# Patient Record
Sex: Male | Born: 1960 | Race: White | Hispanic: No | Marital: Married | Smoking: Current some day smoker
Health system: Southern US, Community
[De-identification: ages and names within clinical notes are randomized; demographics above are authoritative.]

## PROBLEM LIST (undated history)

## (undated) DIAGNOSIS — I10 Essential (primary) hypertension: Secondary | ICD-10-CM

## (undated) DIAGNOSIS — R29898 Other symptoms and signs involving the musculoskeletal system: Secondary | ICD-10-CM

## (undated) DIAGNOSIS — G4733 Obstructive sleep apnea (adult) (pediatric): Secondary | ICD-10-CM

## (undated) DIAGNOSIS — Z9889 Other specified postprocedural states: Secondary | ICD-10-CM

## (undated) DIAGNOSIS — Z720 Tobacco use: Secondary | ICD-10-CM

## (undated) DIAGNOSIS — I639 Cerebral infarction, unspecified: Secondary | ICD-10-CM

## (undated) DIAGNOSIS — R7303 Prediabetes: Secondary | ICD-10-CM

## (undated) DIAGNOSIS — R634 Abnormal weight loss: Secondary | ICD-10-CM

## (undated) DIAGNOSIS — B1920 Unspecified viral hepatitis C without hepatic coma: Secondary | ICD-10-CM

## (undated) DIAGNOSIS — I428 Other cardiomyopathies: Secondary | ICD-10-CM

## (undated) DIAGNOSIS — I213 ST elevation (STEMI) myocardial infarction of unspecified site: Secondary | ICD-10-CM

## (undated) HISTORY — DX: Other specified postprocedural states: Z98.890

## (undated) HISTORY — DX: Cerebral infarction, unspecified: I63.9

---

## 1999-11-05 DIAGNOSIS — B1920 Unspecified viral hepatitis C without hepatic coma: Secondary | ICD-10-CM

## 1999-11-05 HISTORY — DX: Unspecified viral hepatitis C without hepatic coma: B19.20

## 2011-11-05 DIAGNOSIS — Z9889 Other specified postprocedural states: Secondary | ICD-10-CM

## 2011-11-05 HISTORY — DX: Other specified postprocedural states: Z98.890

## 2012-06-28 ENCOUNTER — Emergency Department (HOSPITAL_COMMUNITY): Payer: Medicaid - Out of State

## 2012-06-28 ENCOUNTER — Inpatient Hospital Stay (HOSPITAL_COMMUNITY)
Admission: EM | Admit: 2012-06-28 | Discharge: 2012-07-01 | DRG: 065 | Disposition: A | Discharge status: Rehabilitation Facility | Payer: Medicaid - Out of State | Attending: Cardiology | Admitting: Cardiology

## 2012-06-28 ENCOUNTER — Encounter (HOSPITAL_COMMUNITY)
Admission: EM | Disposition: A | Discharge status: Rehabilitation Facility | Payer: Self-pay | Source: Home / Self Care | Attending: Cardiology

## 2012-06-28 ENCOUNTER — Encounter (HOSPITAL_COMMUNITY): Payer: Self-pay | Admitting: Emergency Medicine

## 2012-06-28 DIAGNOSIS — R7309 Other abnormal glucose: Secondary | ICD-10-CM | POA: Diagnosis present

## 2012-06-28 DIAGNOSIS — I6529 Occlusion and stenosis of unspecified carotid artery: Secondary | ICD-10-CM | POA: Diagnosis present

## 2012-06-28 DIAGNOSIS — I2510 Atherosclerotic heart disease of native coronary artery without angina pectoris: Secondary | ICD-10-CM | POA: Diagnosis present

## 2012-06-28 DIAGNOSIS — I6330 Cerebral infarction due to thrombosis of unspecified cerebral artery: Principal | ICD-10-CM | POA: Diagnosis present

## 2012-06-28 DIAGNOSIS — R42 Dizziness and giddiness: Secondary | ICD-10-CM

## 2012-06-28 DIAGNOSIS — Z880 Allergy status to penicillin: Secondary | ICD-10-CM

## 2012-06-28 DIAGNOSIS — I213 ST elevation (STEMI) myocardial infarction of unspecified site: Secondary | ICD-10-CM

## 2012-06-28 DIAGNOSIS — I498 Other specified cardiac arrhythmias: Secondary | ICD-10-CM | POA: Diagnosis present

## 2012-06-28 DIAGNOSIS — F17200 Nicotine dependence, unspecified, uncomplicated: Secondary | ICD-10-CM | POA: Diagnosis present

## 2012-06-28 DIAGNOSIS — R29898 Other symptoms and signs involving the musculoskeletal system: Secondary | ICD-10-CM

## 2012-06-28 DIAGNOSIS — R634 Abnormal weight loss: Secondary | ICD-10-CM

## 2012-06-28 DIAGNOSIS — Z8249 Family history of ischemic heart disease and other diseases of the circulatory system: Secondary | ICD-10-CM

## 2012-06-28 DIAGNOSIS — G8190 Hemiplegia, unspecified affecting unspecified side: Secondary | ICD-10-CM | POA: Diagnosis present

## 2012-06-28 DIAGNOSIS — I639 Cerebral infarction, unspecified: Secondary | ICD-10-CM | POA: Diagnosis not present

## 2012-06-28 DIAGNOSIS — I43 Cardiomyopathy in diseases classified elsewhere: Secondary | ICD-10-CM | POA: Diagnosis present

## 2012-06-28 DIAGNOSIS — I119 Hypertensive heart disease without heart failure: Secondary | ICD-10-CM | POA: Diagnosis present

## 2012-06-28 DIAGNOSIS — I4510 Unspecified right bundle-branch block: Secondary | ICD-10-CM | POA: Diagnosis present

## 2012-06-28 DIAGNOSIS — Z720 Tobacco use: Secondary | ICD-10-CM

## 2012-06-28 DIAGNOSIS — I959 Hypotension, unspecified: Secondary | ICD-10-CM | POA: Diagnosis present

## 2012-06-28 DIAGNOSIS — R279 Unspecified lack of coordination: Secondary | ICD-10-CM | POA: Diagnosis present

## 2012-06-28 DIAGNOSIS — G4733 Obstructive sleep apnea (adult) (pediatric): Secondary | ICD-10-CM

## 2012-06-28 DIAGNOSIS — R112 Nausea with vomiting, unspecified: Secondary | ICD-10-CM | POA: Diagnosis present

## 2012-06-28 DIAGNOSIS — Z8619 Personal history of other infectious and parasitic diseases: Secondary | ICD-10-CM

## 2012-06-28 DIAGNOSIS — I10 Essential (primary) hypertension: Secondary | ICD-10-CM

## 2012-06-28 DIAGNOSIS — I428 Other cardiomyopathies: Secondary | ICD-10-CM

## 2012-06-28 HISTORY — DX: Essential (primary) hypertension: I10

## 2012-06-28 HISTORY — DX: Tobacco use: Z72.0

## 2012-06-28 HISTORY — DX: Other symptoms and signs involving the musculoskeletal system: R29.898

## 2012-06-28 HISTORY — DX: Obstructive sleep apnea (adult) (pediatric): G47.33

## 2012-06-28 HISTORY — DX: ST elevation (STEMI) myocardial infarction of unspecified site: I21.3

## 2012-06-28 HISTORY — DX: Prediabetes: R73.03

## 2012-06-28 HISTORY — DX: Abnormal weight loss: R63.4

## 2012-06-28 HISTORY — DX: Other cardiomyopathies: I42.8

## 2012-06-28 HISTORY — DX: Unspecified viral hepatitis C without hepatic coma: B19.20

## 2012-06-28 HISTORY — PX: LEFT HEART CATHETERIZATION WITH CORONARY ANGIOGRAM: SHX5451

## 2012-06-28 LAB — CARDIAC PANEL(CRET KIN+CKTOT+MB+TROPI)
Relative Index: 4.1 — ABNORMAL HIGH (ref 0.0–2.5)
Relative Index: INVALID (ref 0.0–2.5)
Total CK: 106 U/L (ref 7–232)
Total CK: 79 U/L (ref 7–232)
Troponin I: <0.3 ng/mL (ref <0.30)

## 2012-06-28 LAB — CBC WITH DIFFERENTIAL/PLATELET
Hemoglobin: 14.5 g/dL (ref 13.0–17.0)
Lymphocytes Relative: 31 % (ref 12–46)
Lymphs Abs: 4 10*3/uL (ref 0.7–4.0)
Monocytes Relative: 8 % (ref 3–12)
Neutro Abs: 7.2 10*3/uL (ref 1.7–7.7)
Neutrophils Relative %: 56 % (ref 43–77)
Platelets: 255 10*3/uL (ref 150–400)
RBC: 5.01 MIL/uL (ref 4.22–5.81)
WBC: 12.9 10*3/uL — ABNORMAL HIGH (ref 4.0–10.5)

## 2012-06-28 LAB — TSH: TSH: 0.787 u[IU]/mL (ref 0.350–4.500)

## 2012-06-28 LAB — APTT: aPTT: 53 seconds — ABNORMAL HIGH (ref 24–37)

## 2012-06-28 LAB — COMPREHENSIVE METABOLIC PANEL
AST: 17 U/L (ref 0–37)
BUN: 11 mg/dL (ref 6–23)
CO2: 30 mEq/L (ref 19–32)
Calcium: 9.1 mg/dL (ref 8.4–10.5)
Creatinine, Ser: 0.79 mg/dL (ref 0.50–1.35)
GFR calc Af Amer: >90 mL/min (ref >90)
GFR calc non Af Amer: >90 mL/min (ref >90)

## 2012-06-28 LAB — URINALYSIS, ROUTINE W REFLEX MICROSCOPIC
Bilirubin Urine: NEGATIVE
Glucose, UA: NEGATIVE mg/dL
Hgb urine dipstick: NEGATIVE
Protein, ur: NEGATIVE mg/dL
Urobilinogen, UA: 1 mg/dL (ref 0.0–1.0)

## 2012-06-28 LAB — MRSA PCR SCREENING: MRSA by PCR: NEGATIVE

## 2012-06-28 LAB — PROTIME-INR: INR: 1.02 (ref 0.00–1.49)

## 2012-06-28 LAB — MAGNESIUM: Magnesium: 2.2 mg/dL (ref 1.5–2.5)

## 2012-06-28 LAB — HEMOGLOBIN A1C: Hgb A1c MFr Bld: 6.7 % — ABNORMAL HIGH (ref <5.7)

## 2012-06-28 SURGERY — LEFT HEART CATHETERIZATION WITH CORONARY ANGIOGRAM
Anesthesia: LOCAL

## 2012-06-28 MED ORDER — ONDANSETRON HCL 4 MG/2ML IJ SOLN
4.0000 mg | Freq: Four times a day (QID) | INTRAMUSCULAR | Status: DC | PRN
  Administered 2012-06-28 – 2012-06-29 (×3): 4 mg via INTRAVENOUS
  Filled 2012-06-28 (×5): qty 2

## 2012-06-28 MED ORDER — MORPHINE SULFATE 2 MG/ML IJ SOLN
2.0000 mg | INTRAMUSCULAR | Status: DC | PRN
  Administered 2012-06-28 – 2012-06-30 (×5): 2 mg via INTRAVENOUS
  Filled 2012-06-28 (×5): qty 1

## 2012-06-28 MED ORDER — ALPRAZOLAM 0.25 MG PO TABS: 0.2500 mg | ORAL_TABLET | Freq: Three times a day (TID) | ORAL | Status: DC | PRN

## 2012-06-28 MED ORDER — ATORVASTATIN CALCIUM 40 MG PO TABS
40.0000 mg | ORAL_TABLET | Freq: Every day | ORAL | Status: DC
  Administered 2012-06-28 – 2012-06-30 (×2): 40 mg via ORAL
  Filled 2012-06-28 (×4): qty 1

## 2012-06-28 MED ORDER — SODIUM CHLORIDE 0.9 % IV SOLN: 250.0000 mL | INTRAVENOUS | Status: DC | PRN

## 2012-06-28 MED ORDER — FENTANYL CITRATE 0.05 MG/ML IJ SOLN
INTRAMUSCULAR | Status: AC
  Filled 2012-06-28: qty 2

## 2012-06-28 MED ORDER — SODIUM CHLORIDE 0.9 % IJ SOLN
3.0000 mL | Freq: Two times a day (BID) | INTRAMUSCULAR | Status: DC
  Administered 2012-06-29 – 2012-06-30 (×3): 3 mL via INTRAVENOUS
  Administered 2012-06-30: 10:00:00 via INTRAVENOUS
  Administered 2012-07-01: 3 mL via INTRAVENOUS

## 2012-06-28 MED ORDER — POTASSIUM CHLORIDE 10 MEQ/50ML IV SOLN
INTRAVENOUS | Status: AC
  Filled 2012-06-28: qty 50

## 2012-06-28 MED ORDER — HYDRALAZINE HCL 20 MG/ML IJ SOLN
10.0000 mg | Freq: Once | INTRAMUSCULAR | Status: AC
  Administered 2012-06-28: 10 mg via INTRAVENOUS

## 2012-06-28 MED ORDER — HYDRALAZINE HCL 20 MG/ML IJ SOLN
INTRAMUSCULAR | Status: AC
  Filled 2012-06-28: qty 1

## 2012-06-28 MED ORDER — NITROGLYCERIN IN D5W 200-5 MCG/ML-% IV SOLN
50.0000 ug/min | INTRAVENOUS | Status: DC
  Administered 2012-06-28 – 2012-06-29 (×2): 50 ug/min via INTRAVENOUS
  Filled 2012-06-28: qty 250

## 2012-06-28 MED ORDER — NITROGLYCERIN 0.2 MG/ML ON CALL CATH LAB
INTRAVENOUS | Status: AC
  Filled 2012-06-28: qty 1

## 2012-06-28 MED ORDER — LISINOPRIL 10 MG PO TABS
10.0000 mg | ORAL_TABLET | Freq: Every day | ORAL | Status: DC
  Administered 2012-06-28: 10 mg via ORAL
  Filled 2012-06-28 (×2): qty 1

## 2012-06-28 MED ORDER — HEPARIN (PORCINE) IN NACL 2-0.9 UNIT/ML-% IJ SOLN
INTRAMUSCULAR | Status: AC
  Filled 2012-06-28: qty 2000

## 2012-06-28 MED ORDER — SODIUM CHLORIDE 0.9 % IJ SOLN: 3.0000 mL | INTRAMUSCULAR | Status: DC | PRN

## 2012-06-28 MED ORDER — HYDRALAZINE HCL 20 MG/ML IJ SOLN
INTRAMUSCULAR | Status: AC
  Filled 2012-06-28: qty 2

## 2012-06-28 MED ORDER — SODIUM CHLORIDE 0.9 % IV SOLN
INTRAVENOUS | Status: DC
  Administered 2012-06-28: 16:00:00 via INTRAVENOUS

## 2012-06-28 MED ORDER — NITROGLYCERIN IN D5W 200-5 MCG/ML-% IV SOLN: 2.0000 ug/min | INTRAVENOUS | Status: DC

## 2012-06-28 MED ORDER — TRAMADOL HCL 50 MG PO TABS
50.0000 mg | ORAL_TABLET | Freq: Four times a day (QID) | ORAL | Status: DC | PRN
  Administered 2012-07-01: 50 mg via ORAL
  Filled 2012-06-28 (×2): qty 1

## 2012-06-28 MED ORDER — MORPHINE SULFATE 2 MG/ML IJ SOLN
2.0000 mg | Freq: Once | INTRAMUSCULAR | Status: AC
  Administered 2012-06-28: 2 mg via INTRAVENOUS
  Filled 2012-06-28: qty 1

## 2012-06-28 MED ORDER — LIDOCAINE HCL (PF) 1 % IJ SOLN
INTRAMUSCULAR | Status: AC
  Filled 2012-06-28: qty 30

## 2012-06-28 MED ORDER — POTASSIUM CHLORIDE 10 MEQ/100ML IV SOLN
INTRAVENOUS | Status: AC
  Administered 2012-06-28: 10 meq
  Filled 2012-06-28: qty 100

## 2012-06-28 MED ORDER — ASPIRIN 81 MG PO CHEW
81.0000 mg | CHEWABLE_TABLET | Freq: Every day | ORAL | Status: DC
  Administered 2012-06-28: 81 mg via ORAL
  Filled 2012-06-28: qty 1

## 2012-06-28 MED ORDER — MIDAZOLAM HCL 2 MG/2ML IJ SOLN
INTRAMUSCULAR | Status: AC
  Filled 2012-06-28: qty 2

## 2012-06-28 MED ORDER — CARVEDILOL 3.125 MG PO TABS
3.1250 mg | ORAL_TABLET | Freq: Two times a day (BID) | ORAL | Status: DC
  Administered 2012-06-28: 3.125 mg via ORAL
  Filled 2012-06-28 (×4): qty 1

## 2012-06-28 MED ORDER — HYDRALAZINE HCL 20 MG/ML IJ SOLN: 20.0000 mg | INTRAMUSCULAR | Status: DC | PRN

## 2012-06-28 MED ORDER — ACETAMINOPHEN 325 MG PO TABS
650.0000 mg | ORAL_TABLET | ORAL | Status: DC | PRN
  Administered 2012-06-28: 650 mg via ORAL
  Filled 2012-06-28: qty 2

## 2012-06-28 NOTE — CV Procedure (Signed)
[facility] PERCUTANEOUS CORONARY INTERVENTION REPORT  NAME:  Glenn Maldonado   MRN: [id number] DOB:  10/09/1961   ADMIT DATE: 06/28/2012  INTERVENTIONAL CARDIOLOGIST: Marykay Lex, M.D., MS PRIMARY CARE PROVIDER: Sheila Oats, MD PRIMARY CARDIOLOGIST: None   PATIENT:  Glenn Maldonado is a 51 y.o. male with no prior cardiac history, presents today from [facility] after developing chest pressure, lt. Arm heaviness this am, and having difficulty walking, legs not wanting to go where he wanted then to. He went on to church and symptoms increased so he went to the hospital He was HTN with 178/118. He was started on ASA given IV NTG, IV lopressor 5 mg IV and 5,000 units of Heparin given. Heparin drip started. EKG with ST elevation in ant Leads, V3 mostly. Pt transported to [facility] emergently for cardiac cath. On arrival he seems to down play symptoms, but still with chest pressure, "like a house cat sitting on my chest" and lt. Arm heaviness. Ext. Still feel tingling. BP still elevated IV NTG increased. IV Hydralazine 10 mg given. Will resume IV Heparin once CT of Head evaluated for bleed.  Diagnoses with HTN 2 years ago but has not been able to afford MD since to continue meds. Has had weight loss recently without trying, previously borderline diabetic.  Based upon the dramatic ST Depressions in Inferolateral Leads & STE in Septal leads with atypical Sx for Angina - there is sufficient concern for LAD disease to refer for urgent cardiac catheterization.  PRE-OPERATIVE DIAGNOSIS:    Anterior STEMI  PROCEDURES PERFORMED:    Left Heart Catheterization via 6 Fr Right Common Femoral Artery access  Left Ventriculography, (RAO) 12 ml/sec for 25 ml total contrast  Native Coronary Angiography  Intracoronary Nitroglycerin injection  PROCEDURE: Consent:  Risks of procedure as well as the alternatives and risks of each were explained to the (patient/caregiver).   Consent for procedure obtained.  PROCEDURE: The patient was brought to the 2nd Floor [facility] in the fasting state and prepped and draped in the usual sterile fashion for Right groin access. Sterile technique was used including antiseptics, cap, gloves, gown, hand hygiene, mask and sheet.  Skin prep: Chlorhexidine;  Time Out: Verified patient identification, verified procedure, site/side was marked, verified correct patient position, special equipment/implants available, medications/allergies/relevent history reviewed, required imaging and test results available.  Performed  Access: Right Common Femoral Artery Artery; 6 Fr Sheath; fluoroscopically guided, modified Seldinger technique.  Initial puncture appeared to be a calcified portion distal common femoral artery and I was unable to pass a wire. Therefore the wire and balloon needle removed and draped in the pressure held for 3 minutes to ensure hemostasis. On second access attempt, a Versicore wire passed easily without difficulty. Diagnostic Angiography:  Catheter were advanced or exchanged over a wire.  After initial access using the Versicore wire, a J-wire was used for the remainder the case.  Left Coronary Artery Angiography: 5 French JL4  Right Coronary Artery Angiography: 5 French JR 4  LV Hemodynamics (LV Gram): 5 French angled pigtail catheter; RAO projection 12 mL contrast/sec for total 25 mL  Hemodynamics:  Central Aortic / Mean Pressures:   Initial pressures -- 189/113 mmHg, 145 mmHg; after IV hydralazine and intracoronary nitroglycerin with increasing then the IV nitroglycerin rate 50 mcg/minute -- 150/86 mmHg; 114 mmHg  Left Ventricular Pressures / EDP: 150/11 mmHg; 14 mmHg  Left Ventriculography:  EF: 40-45% %  Wall Motion: Global hypokinesis  Coronary  Anatomy:  Left Main: Large-caliber vessel, that bifurcates into LAD and Circumflex, along with a small insignificant Ramus Intermedius;  angiographically normal LAD: Large caliber artery that wraps around the apex after giving rise to 2 major diagonal branches from the proximal and early mid segment; minimal luminal irregularities.  No obvious potential culprit lesion anterior ST elevations.  D1: Moderate caliber vessel covers a large distribution with 3 main branches; angiographically normal  D2: Moderate moderate caliber vessel covering a small distribution than D1; angiographically normal Left Circumflex: Large caliber vessel gives rise to a small marginal branch before bifurcating into a large OM 1 and the AV Groove Circumflex that bifurcates into OM 2 and total portion that bifurcates into 2 posterior lateral branches.  OM1: Moderate caliber vessel bifurcates distally covering large distribution the inferolateral wall; minimal luminal irregularities  OM 2: Small moderate caliber vessel; minimal luminal irregular Ramus intermedius: Small caliber vessel tapers into a very small vessel after short normal segment is diffusely diseased covering are small distribution.  RCA: Large caliber dominant vessel with a 20% proximal lesion diffuse 10% lesions throughout the proximal and mid segment. There is a moderate caliber artery marginal branch just before the genu. The distal vessel as a tech 20-30% narrowing just proximal to the bifurcation of the PDA and posterior lateral system.  RPDA: Moderate caliber vessel; minimal luminal irregularities  RPL Sysytem:The Right Posterior AV Groove Branch begins a moderate caliber vessel gives [place] to small posterior lateral branch proximally and a major, extensive RPL 2 as well as a small RPL 3; minimal luminal irregularities.  ANESTHESIA:   Local Lidocaine 18  ml SEDATION:  2 mg IV Versed, 50 mcg IV fentanyl ;  MEDICATIONS:  IV Hydralazine 20mg ; IC NTG   EBL:   < 20 ml  PATIENT DISPOSITION:    The patient was transferred to the PACU holding area in a hemodynamicaly stable, chest pain  free condition.  The patient tolerated the procedure well, and there were no complications.  The patient was stable before, during, and after the procedure.  POST-OPERATIVE DIAGNOSIS:    Non-obstructive CAD.  NO lesion to explain Anterior STE on ECG  Moderately reduced EF ~35% on LV Gram with LVH & global hypokinesis -- Non-ischemic, Hypertensive Cardiomyopathy  PLAN OF CARE:  Aggressive BP control overnight & institute BB + ACE-I for BP control & low EF  Statin  Smoking cessation counseling  Care Management to assist with medication needs (ran out of BP meds due to $$ concerns)  Based on his initial presentation with bilateral leg weakness and arm numbness, will order MRI/MRA of the brain for possible CVA/TIA.    Anticipate d/c as early as tomorrow if MRI is normal and blood pressure controlled, with no gait instability/leg weakness    Shaqueta Casady,Gehrig W, M.D., M.S. [facility] [address] [place], [place]  [address]  [phone]  06/28/2012 3:24 PM

## 2012-06-28 NOTE — ED Notes (Signed)
Consent for cardiac cath signed.

## 2012-06-28 NOTE — ED Notes (Signed)
Pt returned from CT scan with RN at bedside.  Cath lab not ready.  Portable chest xray being completed.

## 2012-06-28 NOTE — ED Notes (Addendum)
Dizziness and left arm pain.  Pt from [facility] in [place].  Heparin 5000 unit bolus and Heparin 1000 units/hr started at [facility].  Nitroglycerin drip 20 mcg/kg/hr started at [facility]. Metoprolol 5 mg IV and Aspiring 324mg  PO given at [facility].

## 2012-06-28 NOTE — ED Notes (Signed)
Delay in Heparin administration.  NP placed hold on medication until results of CT scan.

## 2012-06-28 NOTE — Progress Notes (Signed)
Pt c/o numbness in left fingers and hands, and headache associated with vomitting.Bedside stroke assessment done x2 by primary RN and charge nurse, were negative.Morphine and zofran IV given. Informed on call PA. New orders for xanax received. Few minutes later, pt went back to sleep and snoring.

## 2012-06-28 NOTE — H&P (Signed)
Glenn Maldonado is an 51 y.o. male.   Chief Complaint: chest and lt. Arm heaviness HPI: 51 year old WMM, with no prior cardiac history, presents today from [facility] after developing chest pressure, lt. Arm heaviness this am, and having difficulty walking, legs not wanting to go where he wanted then to.  He went on to church and symptoms increased so he went to the hospital  He was HTN with 178/118.  He was started on ASA given IV NTG, IV lopressor 5 mg IV and 5,000 units of Heparin given.  Heparin drip started.  EKG with ST elevation in ant Leads, V3 mostly.   Pt transported to [facility] emergently for cardiac cath.  On arrival he seems to down play symptoms, but still with chest pressure, "like a house cat sitting on my chest" and lt. Arm heaviness.  Ext. Still feel tingling.  BP still elevated IV NTG increased.  IV Hydralazine 10 mg given.  Will resume IV Heparin once CT of   Head evaluated for bleed.  Diagnoses with HTN 2 years ago but has not been able to afford MD since to continue meds.  Has had weight loss recently without trying, previously borderline diabetic.    Past Medical History  Diagnosis Date  . Hypertension   . Borderline diabetic   . STEMI (ST elevation myocardial infarction) 06/28/2012  . HTN (hypertension), uncontrolled 06/28/2012  . Leg weakness, bilateral 06/28/2012  . Tobacco abuse 06/28/2012  . Weight loss, non-intentional 06/28/2012  . OSA (obstructive sleep apnea), history of, but with recent weight loss less symptoms 06/28/2012  . Hepatitis C 2001    treated with interferon    History reviewed. No pertinent past surgical history.did have Lt. Knee surgery multiple years ago   Family History  Problem Relation Age of Onset  . Coronary artery disease Mother   . Peripheral vascular disease Father   . HIV Brother    Social History:  reports that he has been smoking.  He has never used smokeless tobacco. He reports that he does not drink alcohol or use illicit  drugs. Married, works as a Music therapist, 2 children.  1 ppd of tobacco  Stopped ETOH in 2001  Allergies:  Allergies  Allergen Reactions  . Penicillins    NO OUTPATIENT MEDICATIONS  No results found for this or any previous visit (from the past 48 hour(s)). No results found.  ROS: General: recent wt. Loss of >40 pounds, has more energy Skin:cyst type structure on lt. shinn HEENT:no blurred vision, missing several teeth XB:JYNWG pain PUL:no SOB GI:no diarrhea, no constipation, no melena GU:no dysuria, no hematuria MS:no specific joint complaints, leg weakness today Neuro:no syncope, + leg weakness today Endo:hx borderline diabetes   Blood pressure 189/116, pulse 68, temperature 97 F (36.1 C), temperature source Oral, resp. rate 15, SpO2 98.00%. PE: General:alert and oriented, NAD, pleasant affect Skin:warm and dry, brisk capillary refill HEENT:normocephalic, sclera clear Neck:supple, no JVD Heart:S1S2 RRR without murmur gallup rub or click Lungs:clear without rales, rhonchi or wheezes Abd:+ BS, soft, non tender Ext:no edema, 2 + pedal and radial pulses bil. Neuro:alert and oriented X3, MAE, grips equal and strong, push pull of arms and legs equal and strong.    Assessment/Plan Active Problems:  STEMI (ST elevation myocardial infarction)  HTN (hypertension), uncontrolled  Leg weakness, bilateral  Tobacco abuse  Weight loss, non-intentional  OSA (obstructive sleep apnea), history of, but with recent weight loss less symptoms  PLAN:CT head done to rule out bleed,  negative, will proceed to cardiac cath lab emergently. BP improving  INGOLD,LAURA R 06/28/2012, 1:34 PM  I have seen & examined the patient along with Ms. Ingold.  I agree with her findings, exam & initial plan. Did not feel well thi AM -- unable to get up / stand when getting out of car @ church this AM.  Bilateral Leg weakness with arm numbness.  CT Head performed - no bleed. ECG with RBBB & ~105mmSTE most  notable in V2&3 with diffuse ST Depressions - this is either ischemic or due to LVH with repolarization abnormality; with his L arm numbness, I am concerned that this may be his Angina equivalent Sx vs. Hypertensive Crisis (severe uncontrolled HTN on admission). He has multiple RFs.  We will plan Urgent LHC to assess for Anterior STEMI.  Further plans pending results of Cath. May well need MRI/MRA of Brain to eval TIA/CVA.  Marykay Lex, M.D., M.S. [facility] [address] [place], [place]  [address]  [phone] Pager # [phone]  06/28/2012 4:01 PM

## 2012-06-28 NOTE — ED Notes (Signed)
Cardiology at bedside.

## 2012-06-28 NOTE — ED Notes (Addendum)
Nitroglycerin drip increased to 30 mcg/min per V.O. Ingold NP.

## 2012-06-28 NOTE — ED Provider Notes (Signed)
History     CSN: [id number]  Arrival date & time 06/28/12  1246   First MD Initiated Contact with Patient 06/28/12 1247      Chief Complaint  Patient presents with  . Code STEMI    (Consider location/radiation/quality/duration/timing/severity/associated sxs/prior treatment) The history is provided by the patient.  pt hx htn, presents w chest heaviness/pressure, arm heaviness, gen weakness onset this morning when went to walk outside. Worse w walking.  Radiates towards arms bil. Symptoms constant since onset, but pt notes presssure/heaviness almost completely resolved. Mil sob. No nv. Mild sob. No cough or fever. No leg pain or swelling. No dvt or pe hx. No hx cad. Went to [facility] ED, was found to have stemi and was sent here.    Past Medical History  Diagnosis Date  . Hypertension   . Borderline diabetic     No past surgical history on file.  No family history on file.  History  Substance Use Topics  . Smoking status: Current Everyday Smoker  . Smokeless tobacco: Not on file  . Alcohol Use:       Review of Systems  Constitutional: Negative for fever and chills.  HENT: Negative for neck pain.   Eyes: Negative for visual disturbance.  Respiratory: Negative for cough.   Cardiovascular: Positive for chest pain.  Gastrointestinal: Negative for abdominal pain.  Genitourinary: Negative for flank pain.  Musculoskeletal: Negative for back pain.  Skin: Negative for rash.  Neurological: Negative for headaches.  Hematological: Does not bruise/bleed easily.  Psychiatric/Behavioral: Negative for confusion.    Allergies  Penicillins  Home Medications  No current outpatient prescriptions on file.  BP 149/86  Pulse 64  Temp 97 F (36.1 C) (Oral)  Resp 18  SpO2 96%  Physical Exam  Nursing note and vitals reviewed. Constitutional: He is oriented to person, place, and time. He appears well-developed and well-nourished. No distress.  HENT:  Head: Atraumatic.  Eyes:  Conjunctivae are normal.  Neck: Neck supple. No tracheal deviation present.  Cardiovascular: Normal rate, regular rhythm, normal heart sounds and intact distal pulses.   Pulmonary/Chest: Effort normal and breath sounds normal. No accessory muscle usage. No respiratory distress. He exhibits no tenderness.  Abdominal: Soft. Bowel sounds are normal. He exhibits no distension. There is no tenderness.  Musculoskeletal: Normal range of motion. He exhibits no edema and no tenderness.  Neurological: He is alert and oriented to person, place, and time.  Skin: Skin is warm and dry.  Psychiatric: He has a normal mood and affect.    ED Course  Procedures (including critical care time)     MDM  Iv ns. Reviewed records from outside ED. STEMI, code stemi and cath lab notified pta to [facility] ED. Repeat ecg, st elev ant.    Date: 06/28/2012  Rate: 67  Rhythm: normal sinus rhythm  QRS Axis: left  Intervals: normal  ST/T Wave abnormalities: ST elevations anteriorly  Conduction Disutrbances:right bundle branch block  Narrative Interpretation:   Old EKG Reviewed: none available   Pt has had asa and metoprolol prior to ed arrival. Has had heparin bolus and is on heparin drip.  Cardiology service in ed w pt, to cath lab asap.        Suzi Roots, MD 06/28/12 [phone]

## 2012-06-28 NOTE — ED Notes (Signed)
MD at bedside. 

## 2012-06-28 NOTE — Progress Notes (Signed)
Responded to code stemi.  Met his wife and mother-in-law when they arrived and provided emotional support.  Communicated with staff to find out that his family could come down to see him.  We shared a prayer before he was taken to his scan and to the cath lab.  [employer] Pager, [phone] 4:31 PM   06/28/12 1600  Clinical Encounter Type  Visited With Patient;Patient and family together  Visit Type Spiritual support;Pre-op  Referral From (Received an alert for code stemi.)  Spiritual Encounters  Spiritual Needs Prayer;Emotional  Stress Factors  Patient Stress Factors Health changes

## 2012-06-29 ENCOUNTER — Inpatient Hospital Stay (HOSPITAL_COMMUNITY): Payer: Medicaid - Out of State

## 2012-06-29 DIAGNOSIS — I959 Hypotension, unspecified: Secondary | ICD-10-CM | POA: Insufficient documentation

## 2012-06-29 DIAGNOSIS — I6350 Cerebral infarction due to unspecified occlusion or stenosis of unspecified cerebral artery: Secondary | ICD-10-CM

## 2012-06-29 DIAGNOSIS — I219 Acute myocardial infarction, unspecified: Secondary | ICD-10-CM

## 2012-06-29 DIAGNOSIS — I10 Essential (primary) hypertension: Secondary | ICD-10-CM

## 2012-06-29 DIAGNOSIS — I639 Cerebral infarction, unspecified: Secondary | ICD-10-CM | POA: Diagnosis not present

## 2012-06-29 LAB — POCT I-STAT, CHEM 8
Creatinine, Ser: 0.8 mg/dL (ref 0.50–1.35)
Glucose, Bld: 109 mg/dL — ABNORMAL HIGH (ref 70–99)
Hemoglobin: 14.3 g/dL (ref 13.0–17.0)
TCO2: 26 mmol/L (ref 0–100)

## 2012-06-29 LAB — CARDIAC PANEL(CRET KIN+CKTOT+MB+TROPI)
CK, MB: 3.6 ng/mL (ref 0.3–4.0)
CK, MB: 3.4 ng/mL (ref 0.3–4.0)
Total CK: 61 U/L (ref 7–232)
Troponin I: <0.3 ng/mL (ref <0.30)

## 2012-06-29 LAB — GLUCOSE, CAPILLARY
Glucose-Capillary: 122 mg/dL — ABNORMAL HIGH (ref 70–99)
Glucose-Capillary: 98 mg/dL (ref 70–99)

## 2012-06-29 MED ORDER — DOPAMINE-DEXTROSE 3.2-5 MG/ML-% IV SOLN
2.0000 ug/kg/min | INTRAVENOUS | Status: DC
  Administered 2012-06-29: 2 ug/kg/min via INTRAVENOUS
  Filled 2012-06-29: qty 250

## 2012-06-29 MED ORDER — ASPIRIN 300 MG RE SUPP
150.0000 mg | Freq: Every day | RECTAL | Status: DC
  Administered 2012-06-29: 150 mg via RECTAL
  Filled 2012-06-29 (×2): qty 1

## 2012-06-29 MED ORDER — ENOXAPARIN SODIUM 40 MG/0.4ML ~~LOC~~ SOLN
40.0000 mg | SUBCUTANEOUS | Status: DC
  Administered 2012-06-29 – 2012-07-01 (×3): 40 mg via SUBCUTANEOUS
  Filled 2012-06-29 (×3): qty 0.4

## 2012-06-29 MED ORDER — DEXTROSE-NACL 5-0.45 % IV SOLN
INTRAVENOUS | Status: DC
  Administered 2012-06-29 – 2012-06-30 (×3): 75 mL/h via INTRAVENOUS

## 2012-06-29 MED ORDER — PHENYLEPHRINE HCL 10 MG/ML IJ SOLN
30.0000 ug/min | INTRAVENOUS | Status: DC
  Filled 2012-06-29: qty 1

## 2012-06-29 NOTE — Code Documentation (Signed)
51 yo wm brought in yesterday by EMS for STEMI with left side weakness on admission.  Pt now found to have facial droop, dysarthria, & worsening Lt weakness. Code stroke called [phone], pt arrival 8/25 1422, neuro MD eval 0500, stroke team arrival 0458, LSN prior to 2000, pt arrival in CT 0515, phlebotomist arrival 0540, CT read by neurologist [phone]. NIH 9.

## 2012-06-29 NOTE — Progress Notes (Signed)
SLP Cancellation Note  New orders received. Spoke with nursing. Nursing advised that swallow evaluation be held today and that SLP f/u in am.   Ferdinand Lango MA, CCC-SLP [phone]   Ferdinand Lango Meryl 06/29/2012, 3:38 PM

## 2012-06-29 NOTE — Consult Note (Signed)
Referring Physician: Bryan Lemma    Chief Complaint: Left sided weakness  HPI: Glenn Maldonado is an 51 y.o. male with a history of hypertension, MI, DM, tobacco abuse, OSA presented yesterday with ST elevations on EKG in the setting of left-sided paresthesias and weakness. He was brought in emergently for cardiac catheterization. After catheterization which did not show any coronary artery blockage, was recommended that he had persistent left-sided weakness. He therefore got a head CT which is negative, and is scheduled for an MRI today.  Just before 4 AM, he was able to converse without much dysarthria, then it was noticed that he was having significant dysarthria and therefore code stroke was called at 4:53.  Patient has also had some shortness of breath, and nausea and vomiting.  LSN: prior to admission, day prior to alert tPA Given: No: Outside treatment window  Past Medical History  Diagnosis Date  . Hypertension   . Borderline diabetic   . STEMI (ST elevation myocardial infarction) 06/28/2012  . HTN (hypertension), uncontrolled 06/28/2012  . Leg weakness, bilateral 06/28/2012  . Tobacco abuse 06/28/2012  . Weight loss, non-intentional 06/28/2012  . OSA (obstructive sleep apnea), history of, but with recent weight loss less symptoms 06/28/2012  . Hepatitis C 2001    treated with interferon  . NICM (nonischemic cardiomyopathy), EF 35% at cardiac cath 06/28/2012     Family History  Problem Relation Age of Onset  . Coronary artery disease Mother   . Peripheral vascular disease Father   . HIV Brother    Social History:  reports that he has been smoking.  He has never used smokeless tobacco. He reports that he does not drink alcohol or use illicit drugs.  Allergies:  Allergies  Allergen Reactions  . Penicillins     Unknown    Medications:  Scheduled:   . aspirin  81 mg Oral Daily  . atorvastatin  40 mg Oral q1800  . fentaNYL      . heparin      . hydrALAZINE  10 mg  Intravenous Once  . lidocaine      . midazolam      .  morphine injection  2 mg Intravenous Once  . nitroGLYCERIN      . potassium chloride      . potassium chloride      . sodium chloride  3 mL Intravenous Q12H  . DISCONTD: carvedilol  3.125 mg Oral BID WC  . DISCONTD: lisinopril  10 mg Oral Daily   Continuous:   . sodium chloride 75 mL/hr at 06/28/12 1900  . DOPamine    . nitroGLYCERIN 50 mcg/min (06/29/12 0419)  . DISCONTD: nitroGLYCERIN Stopped (06/28/12 1422)  . DISCONTD: phenylephrine (NEO-SYNEPHRINE) Adult infusion     ZOX:WRUEAV chloride, acetaminophen, ALPRAZolam, morphine injection, ondansetron (ZOFRAN) IV, sodium chloride, traMADol, DISCONTD: hydrALAZINE  ROS: 11 point review of systems was negative except as noted in the history of present illness.  Physical Examination: Blood pressure 97/49, pulse 60, temperature 97.9 F (36.6 C), temperature source Oral, resp. rate 12, height 5\' 10"  (1.778 m), weight 82.2 kg (181 lb 3.5 oz), SpO2 98.00%.  Neurologic Examination: Mental Status: Awake, Alert, Able to answer questions appropriately. Oriented to month and year. Naming intact.  Cranial Nerves: II- VFF, Discs not well visualized II/IV/VI-EOMI, PERRL V/VII- Left lower facial droop, decreased sensation on left face VIII- hearing intact to voice XI- left shoulder elevates less than right.  XII-tongue deviates to the left.  Motor: 2/5 in left  arm proximally, unable to wiggle fingers, 2/5 proximal leg, able to wiggle toes. 5/5 throughout right side Sensory: Decreased to light touch throughout left side.  DTR's: 2+ and symmetric at patellae and biceps.  Cerebellar:intact on right, unable to perform on left Gait: unable to assess dues to weakness.    Laboratory Studies:  Basic Metabolic Panel:  Lab 06/28/12 8295  NA 137  K 3.5  CL 99  CO2 30  GLUCOSE 109*  BUN 11  CREATININE 0.79  CALCIUM 9.1  MG 2.2  PHOS --    Liver Function Tests:  Lab 06/28/12 1408    AST 17  ALT 13  ALKPHOS 92  BILITOT 0.5  PROT 6.9  ALBUMIN 3.5   No results found for this basename: LIPASE:5,AMYLASE:5 in the last 168 hours No results found for this basename: AMMONIA:3 in the last 168 hours  CBC:  Lab 06/28/12 1408  WBC 12.9*  NEUTROABS 7.2  HGB 14.5  HCT 40.9  MCV 81.6  PLT 255    Cardiac Enzymes:  Lab 06/29/12 0430 06/28/12 2025 06/28/12 1408  CKTOTAL 61 79 106  CKMB 3.6 3.7 4.3*  CKMBINDEX -- -- --  TROPONINI <0.30 <0.30 <0.30    BNP: No components found with this basename: POCBNP:5  CBG: No results found for this basename: GLUCAP:5 in the last 168 hours  Microbiology: Results for orders placed during the hospital encounter of 06/28/12  MRSA PCR SCREENING     Status: Normal   Collection Time   06/28/12  4:09 PM      Component Value Range Status Comment   MRSA by PCR NEGATIVE  NEGATIVE Final     Coagulation Studies:  Basename 06/28/12 1408  LABPROT 13.6  INR 1.02    Urinalysis:  Lab 06/28/12 1831  COLORURINE YELLOW  LABSPEC >1.046*  PHURINE 7.0  GLUCOSEU NEGATIVE  HGBUR NEGATIVE  BILIRUBINUR NEGATIVE  KETONESUR NEGATIVE  PROTEINUR NEGATIVE  UROBILINOGEN 1.0  NITRITE NEGATIVE  LEUKOCYTESUR NEGATIVE    Lipid Panel: No results found for this basename: chol, trig, hdl, cholhdl, vldl, ldlcalc    HgbA1C:  Lab Results  Component Value Date   HGBA1C 6.7* 06/28/2012    Urine Drug Screen:   No results found for this basename: labopia, cocainscrnur, labbenz, amphetmu, thcu, labbarb    Alcohol Level: No results found for this basename: ETH:2 in the last 168 hours  Other results: EKG: normal EKG, normal sinus rhythm.  Imaging: Ct Head Wo Contrast  06/29/2012  *RADIOLOGY REPORT*  Clinical Data: Left-sided weakness, facial droop.  CT HEAD WITHOUT CONTRAST  Technique:  Contiguous axial images were obtained from the base of the skull through the vertex without contrast.  Comparison: 06/28/2012  Findings: Periventricular and  subcortical white matter hypodensities are most in keeping with chronic microangiopathic change. There is no evidence for acute hemorrhage, hydrocephalus, mass lesion, or abnormal extra-axial fluid collection.  No definite CT evidence for acute cortical based (large artery) infarction. The visualized paranasal sinuses and mastoid air cells are predominately clear.  IMPRESSION: White matter changes as above.  No definite evidence of acute intracranial abnormality. MRI has increased sensitivity for acute ischemia if clinical concern persists.   Original Report Authenticated By: Waneta Martins, M.D.    Ct Head Wo Contrast  06/28/2012  *RADIOLOGY REPORT*  Clinical Data: Dizziness.  Rule out stroke.  Code STEMI.  CT HEAD WITHOUT CONTRAST  Technique:  Contiguous axial images were obtained from the base of the skull through the vertex without  contrast.  Comparison: None.  Findings: Normal appearance of the intracranial structures.  No evidence for acute hemorrhage, mass lesion, midline shift, hydrocephalus or large infarct.  There are subtle areas of low density in the subcortical and periventricular white matter which could represent chronic changes.  The paranasal sinuses are clear. No acute bony abnormality.  IMPRESSION: No evidence for acute hemorrhage.  No evidence for a large infarct.  Subtle areas of low density in the white matter may represent chronic small vessel ischemic changes.   Original Report Authenticated By: Richarda Overlie, M.D.    Dg Chest Port 1 View  06/28/2012  *RADIOLOGY REPORT*  Clinical Data: Chest pain and shortness of breath.  PORTABLE CHEST - 1 VIEW  Comparison: 06/28/2012  Findings: Portable view of the chest demonstrates clear lungs. Heart and mediastinum are within normal limits.  The trachea is midline.  Negative for airspace disease or pneumothorax.  IMPRESSION: No acute chest findings.   Original Report Authenticated By: Richarda Overlie, M.D.     Assessment: 51 y.o. 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 Initially, he was in the 160s systolic, but then fell to the 90s systolic despite fluid bolus, he was therefore started on a dopamine drip to try to get his systolic pressure back up.   Stroke Risk Factors - diabetes mellitus, hyperlipidemia, hypertension and smoking  Plan: 1. HgbA1c, fasting lipid panel 2. MRI, MRA  of the brain without contrast 3. PT consult, OT consult, Speech consult 4. Echocardiogram 5. Carotid dopplers 6. Prophylactic therapy-Antiplatelet med: Aspirin - dose 81mg  7. Risk factor modification 8. Telemetry monitoring 9. Frequent neuro checks 10. BP Goal 140 - 200   Ritta Slot, MD [facility] [phone] 06/29/2012, 6:50 AM

## 2012-06-29 NOTE — Progress Notes (Signed)
  Echocardiogram 2D Echocardiogram has been performed.  Jorje Guild 06/29/2012, 12:35 PM

## 2012-06-29 NOTE — Progress Notes (Signed)
Bilateral:  No evidence of hemodynamically significant internal carotid artery stenosis.   Vertebral artery flow is antegrade.  Note:  Stenosis noted in the left distal ICA in the 40-59% range by velocity, probably due to vessel tortuosity.

## 2012-06-29 NOTE — Consult Note (Signed)
Physical Medicine and Rehabilitation Consult Reason for Consult: left sided weakness, chest pressure Referring Physician: Dr. Pearlean Brownie   HPI: Glenn Maldonado is a 51 y.o. male with history of HTN, Hep C, admitted on 06/28/12 pm with complaints of chest pressure and difficulty walking as well as left arm heaviness with tingling sensation . BP elevated at 178/118  EKG with ST changes and patient started on IV heparin and CT head done without evidence of bleed.  Patient underwent cardiac cath revealing global hypokinesis with EF 40-45% and non obstructive CAD. Patient with hypotension with BP drop to 97/49 despite fluid bolus. Started on dopamine for stabilization.  He developed facial droop with dysarthria and worsening of left sided weakness. Neurology consulted and felt that patient likely has thrombotic infarct and workup initiated. Carotid dopplers done revealing 40-59% left distal ICA stenosis. For MRI/MRA brain today.  Work up ongoing. PT evaluation done yesterday.  MD, PT recommending CIR.   Review of Systems  HENT: Negative for hearing loss.   Eyes:       ?vision problems since stroke.  Respiratory: Negative for cough, shortness of breath and wheezing.   Cardiovascular: Negative for chest pain and palpitations.  Gastrointestinal: Positive for heartburn. Negative for abdominal pain.       Hungry  Genitourinary: Negative for urgency.  Musculoskeletal: Negative for myalgias and back pain.  Neurological: Positive for speech change, focal weakness and headaches (chronic).       LLE spasms.  Psychiatric/Behavioral: The patient has insomnia ( due to nocturia X 3).        Lot of stress recently--moved from [facility] a month ago/job changes.    Past Medical History  Diagnosis Date  . Hypertension   . Borderline diabetic   . STEMI (ST elevation myocardial infarction) 06/28/2012  . HTN (hypertension), uncontrolled 06/28/2012  . Leg weakness, bilateral 06/28/2012  . Tobacco abuse 06/28/2012  . Weight loss,  non-intentional 06/28/2012  . OSA (obstructive sleep apnea), history of, but with recent weight loss less symptoms 06/28/2012  . Hepatitis C 2001    treated with interferon  . NICM (nonischemic cardiomyopathy), EF 35% at cardiac cath 06/28/2012   History reviewed. No pertinent past surgical history.  Family History  Problem Relation Age of Onset  . Coronary artery disease Mother   . Peripheral vascular disease Father   . 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 Live in [place], [place]. He  reports that he has been smoking 1 PPD for 30 years.   He has never used smokeless tobacco. He reports that quit alcohol use 12 years ago when Dx with Hep C.  He does not use illicit drugs. Wife supportive and can provide supervision past discharge.   Allergies  Allergen Reactions  . Penicillins     Unknown   No prescriptions prior to admission    Home: Home Living Lives With: Spouse Available Help at Discharge: Family Additional Comments: needs to be further assessed.    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, historic buildings) Functional Status:  Mobility: Bed Mobility Bed Mobility: Supine to Sit;Sitting - Scoot to Edge of Bed;Sit to Supine Supine to Sit: 3: Mod assist;HOB elevated;With rails Sitting - Scoot to Edge of Bed: 3: Mod assist Sit to Supine: 3: Mod assist Transfers Transfers: Not assessed (not tested secondary to impulsivity and  only one person )      ADL:    Cognition: Cognition Arousal/Alertness: Awake/alert Orientation Level: Oriented X4 Cognition Overall Cognitive Status: Impaired Area of Impairment: Safety/judgement Arousal/Alertness: Awake/alert Orientation Level: Place (thought he was still at [employer]) Behavior During Session: Agitated Safety/Judgement: Decreased safety judgement for  tasks assessed;Impulsive Cognition - Other Comments: angry, likely greiving the loss of function.    Blood pressure 123/68, pulse 59, temperature 97.8 F (36.6 C), temperature source Axillary, resp. rate 15, height 5\' 10"  (1.778 m), weight 82.2 kg (181 lb 3.5 oz), SpO2 97.00%. Physical Exam  Nursing note and vitals reviewed. Constitutional: He is oriented to person, place, and time. He appears well-developed and well-nourished. He is easily aroused.       Sleepy due to restless night. Easily agitated and difficult to redirect due to frustration.   HENT:  Head: Normocephalic and atraumatic.  Eyes: Pupils are equal, round, and reactive to light.       Few beat of nystagmus horizontally.   Neck: Normal range of motion. Neck supple.  Cardiovascular: Normal rate and regular rhythm.   Pulmonary/Chest: Effort normal and breath sounds normal.  Abdominal: Soft. Bowel sounds are normal.  Musculoskeletal: He exhibits edema (minimal edema left hand.).  Neurological: He is alert, oriented to person, place, and time and easily aroused.       Speech mildly dysarthric.  Left facial droop with tongue deviation to left.  Able to follow basic commands without difficulty. Poor insight with .impulsivity and restlessness.  Left hemiparesis . LUE is 0/5. LLE is 2-3/5 with extensor tone noted. DTR's are 3+ on the left. Toes are up on the left. Perhaps some mild left inattention.   Skin: Skin is warm and dry.  Psychiatric: His mood appears anxious. His affect is angry. He is agitated.    Results for orders placed during the hospital encounter of 06/28/12 (from the past 24 hour(s))  URINALYSIS, ROUTINE W REFLEX MICROSCOPIC     Status: Abnormal   Collection Time   06/28/12  6:31 PM      Component Value Range   Color, Urine YELLOW  YELLOW   APPearance CLEAR  CLEAR   Specific Gravity, Urine >1.046 (*) 1.005 - 1.030   pH 7.0  5.0 - 8.0   Glucose, UA NEGATIVE  NEGATIVE mg/dL   Hgb urine dipstick NEGATIVE  NEGATIVE    Bilirubin Urine NEGATIVE  NEGATIVE   Ketones, ur NEGATIVE  NEGATIVE mg/dL   Protein, ur NEGATIVE  NEGATIVE mg/dL   Urobilinogen, UA 1.0  0.0 - 1.0 mg/dL   Nitrite NEGATIVE  NEGATIVE   Leukocytes, UA NEGATIVE  NEGATIVE  CARDIAC PANEL(CRET KIN+CKTOT+MB+TROPI)     Status: Normal   Collection Time   06/28/12  8:25 PM      Component Value Range   Total CK 79  7 - 232 U/L   CK, MB 3.7  0.3 - 4.0 ng/mL   Troponin I <0.30  <0.30 ng/mL   Relative Index RELATIVE INDEX IS INVALID  0.0 - 2.5  CARDIAC PANEL(CRET KIN+CKTOT+MB+TROPI)     Status: Normal   Collection Time   06/29/12  4:30 AM      Component Value Range   Total CK 61  7 - 232 U/L   CK, MB 3.6  0.3 - 4.0 ng/mL   Troponin I <0.30  <0.30 ng/mL   Relative Index RELATIVE INDEX IS INVALID  0.0 - 2.5  HEMOGLOBIN A1C     Status: Abnormal  Collection Time   06/29/12  6:05 AM      Component Value Range   Hemoglobin A1C 6.8 (*) <5.7 %   Mean Plasma Glucose 148 (*) <117 mg/dL  CARDIAC PANEL(CRET KIN+CKTOT+MB+TROPI)     Status: Normal   Collection Time   06/29/12  1:13 PM      Component Value Range   Total CK 66  7 - 232 U/L   CK, MB 3.4  0.3 - 4.0 ng/mL   Troponin I <0.30  <0.30 ng/mL   Relative Index RELATIVE INDEX IS INVALID  0.0 - 2.5   Ct Head Wo Contrast  06/29/2012  *RADIOLOGY REPORT*  Clinical Data: Left-sided weakness, facial droop.  CT HEAD WITHOUT CONTRAST  Technique:  Contiguous axial images were obtained from the base of the skull through the vertex without contrast.  Comparison: 06/28/2012  Findings: Periventricular and subcortical white matter hypodensities are most in keeping with chronic microangiopathic change. There is no evidence for acute hemorrhage, hydrocephalus, mass lesion, or abnormal extra-axial fluid collection.  No definite CT evidence for acute cortical based (large artery) infarction. The visualized paranasal sinuses and mastoid air cells are predominately clear.  IMPRESSION: White matter changes as above.  No  definite evidence of acute intracranial abnormality. MRI has increased sensitivity for acute ischemia if clinical concern persists.   Original Report Authenticated By: Waneta Martins, M.D.    Dg Chest Port 1 View  06/29/2012  *RADIOLOGY REPORT*  Clinical Data: Possible aspiration  PORTABLE CHEST - 1 VIEW  Comparison: None  Findings: The heart size and mediastinal contours are within normal limits.  Both lungs are clear.  The visualized skeletal structures are unremarkable.  IMPRESSION: No airspace consolidation or evidence of aspiration.   Original Report Authenticated By: Rosealee Albee, M.D.      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 Does the need for close, 24 hr/day medical supervision in concert with the patient's rehab needs make it unreasonable for this patient to be served in a less intensive setting? Yes 2. Co-Morbidities requiring supervision/potential complications: htn, osa, tobacco abuse 3. Due to bladder management, bowel management, safety, skin/wound care, disease management, medication administration, pain management and patient education, does the patient require 24 hr/day rehab nursing? Yes 4. Does the patient require coordinated care of a physician, rehab nurse, PT (1-2 hrs/day, 5 days/week), OT (1-2 hrs/day, 5 days/week) and SLP (1-2 hrs/day, 5 days/week) to address physical and functional deficits in the context of the above medical diagnosis(es)? Yes Addressing deficits in the following areas: balance, endurance, locomotion, strength, transferring, bowel/bladder control, bathing, dressing, feeding, grooming, toileting, cognition, speech, language, swallowing and psychosocial support 5. Can the patient actively participate in an intensive therapy program of at least 3 hrs of therapy per day at least 5 days per week? Yes 6. The potential for patient to make measurable gains while on inpatient rehab is excellent 7. Anticipated  functional outcomes upon discharge from inpatient rehab are minimal assist with PT, minimal assist  with OT, supervision with SLP. 8. Estimated rehab length of stay to reach the above functional goals is: 3 weeks 9. Does the patient have adequate social supports to accommodate these discharge functional goals? Yes 10. Anticipated D/C setting: Home 11. Anticipated post D/C treatments: HH therapy 12. Overall Rehab/Functional Prognosis: excellent  RECOMMENDATIONS: This patient's condition is appropriate for continued rehabilitative care in the following setting: CIR Patient has agreed to participate in recommended program. Yes Note that insurance prior authorization  may be required for reimbursement for recommended care.  Comment: Pt is anxious and angry about being in the hospital. His wife states that "he will participate" in our program, however. Also, I recommend perhaps some low dose klonopin, 0.5mg  to 1mg  qhs, to help with his anxiety and RLS symptoms. Rehab RN to follow up.   Ivory Broad, MD     06/29/2012

## 2012-06-29 NOTE — Progress Notes (Signed)
Informed Dr. Herbie Baltimore of pt's HR, drop in BP, ST elevation on ECG and also on monitor.

## 2012-06-29 NOTE — Progress Notes (Signed)
Subjective:  Lethargic, responsive but slurred speech lt sided weakness. Code STroke called over night. Concerns for hypotension - started on low dose Dopamin & NTG gtt d/c'd.  BP now stable in 120s - but would like it to be higher.  Objective:  Vital Signs in the last 24 hours: Temp:  [97 F (36.1 C)-98.7 F (37.1 C)] 97.6 F (36.4 C) (08/26 0800) Pulse Rate:  [49-90] 59  (08/26 0800) Resp:  [10-27] 14  (08/26 0800) BP: (94-190)/(49-117) 128/60 mmHg (08/26 0800) SpO2:  [91 %-99 %] 99 % (08/26 0800) Weight:  [82.2 kg (181 lb 3.5 oz)] 82.2 kg (181 lb 3.5 oz) (08/25 1615)  Intake/Output from previous day:  Intake/Output Summary (Last 24 hours) at 06/29/12 0849 Last data filed at 06/29/12 0800  Gross per 24 hour  Intake  901.2 ml  Output    350 ml  Net  551.2 ml    Physical Exam: General appearance: no distress, slowed mentation and slurred speech Lungs: coarse rhonchi on Lt, decreased effort Heart: regular rate and rhythm Neurologic: as above slurred speech, slowed mentation ABd: soft, nt, nd   Rate: 78  Rhythm: normal sinus rhythm  Lab Results:  Basename 06/28/12 1408  WBC 12.9*  HGB 14.5  PLT 255    Basename 06/28/12 1408  NA 137  K 3.5  CL 99  CO2 30  GLUCOSE 109*  BUN 11  CREATININE 0.79    Basename 06/29/12 0430 06/28/12 2025  TROPONINI <0.30 <0.30   Hepatic Function Panel  Basename 06/28/12 1408  PROT 6.9  ALBUMIN 3.5  AST 17  ALT 13  ALKPHOS 92  BILITOT 0.5  BILIDIR --  IBILI --   No results found for this basename: CHOL in the last 72 hours  Basename 06/28/12 1408  INR 1.02    Imaging: Imaging results have been reviewed  Cardiac Studies:  Assessment/Plan:  Principal Problem:  *CVA, possible, w/u progress  Actually the more likely Primary Problem as presenting Sx more c/w TIA - CVA. Active Problems:  HTN (hypertension), uncontrolled  Leg weakness, bilateral  Tobacco abuse  Weight loss, non-intentional  OSA (obstructive  sleep apnea), history of, but with recent weight loss less symptoms  STE on ECG - not MI; non-obstructive CAD on Cath, most likely related to HTN  NICM (nonischemic cardiomyopathy), EF 35% at cardiac cath  Hypotension, now on Dopamine per Neuro (likely secondary to NTG GTT & antihypertensives.  Plan- The pt is for MRI this am. He may have aspirated, (vomited yesterday). Will check CXR as well this am, keep NPO.  Corine Shelter PA-C 06/29/2012, 8:49 AM  I have seen & examined the patient along with Mr. Diona Fanti.  I agree with his findings, exam & plan. Non-ischemic CM with abnormal ECG (STE in precordial leads -- actually improved from presenting ECG). EF ~40% ( BB & ACE-I on hold as his HTN from presentation fully reversed.  As I noted on Cath report & per H&P note - L arm numbness yesterday was concerning for TIA Sx & it would now seem as though this was a "warning" sign for what now appears to be a R sided CVA.  Agree with Dopamine to keep BP @ goal for Neurology (hold antihypertensives) MRI/A ordered yesterday -- but not able to be done until today to assess what looks like CVA.  No active SSx of CHF.  Will defer CVA management to Neurology.  Await results of MRI.  Marykay Lex, M.D., M.S. [facility] [address] [place], [place]  [address]  [phone] Pager # [phone]  06/29/2012 9:00 AM

## 2012-06-29 NOTE — Progress Notes (Signed)
Utilization Review Completed.  Glenn Maldonado  06/29/2012  

## 2012-06-29 NOTE — Progress Notes (Signed)
Stroke Team Progress Note  HISTORY Glenn Maldonado is an 51 y.o. male with a history of hypertension, MI, DM, tobacco abuse, OSA presented 06/28/2012 to [facility] with ST elevations on EKG in the setting of left-sided paresthesias and weakness. He was transferred to [facility] emergently for cardiac catheterization. After catheterization which did not show any coronary artery blockage, was recommended that he had persistent left-sided weakness. He therefore got a head CT which is negative, and is scheduled for an MRI today.   Just before 4 AM 06/29/2012, he was able to converse without much dysarthria, then it was noticed that he was having significant dysarthria and therefore code stroke was called at 4:53.   Patient has also had some shortness of breath, and nausea and vomiting.  Patient was not a TPA candidate secondary to symptoms present since the day prior to consult (per history woke with symptoms Sunday morning; dx missed on arrival to [facility]).  SUBJECTIVE His wife, mother are at the bedside.  Overall he feels his condition is gradually worsening. He has significant nausea with vomiting this am. Placed on dopamine early today for increasing BP.  OBJECTIVE Most recent Vital Signs: Filed Vitals:   06/29/12 0600 06/29/12 0630 06/29/12 0700 06/29/12 0800  BP: 106/67 97/49 118/49 128/60  Pulse: 52 60 49 59  Temp:    97.6 F (36.4 C)  TempSrc:    Axillary  Resp: 13 12 12 14   Height:      Weight:      SpO2: 95% 98% 98% 99%   CBG (last 3)  No results found for this basename: GLUCAP:3 in the last 72 hours Intake/Output from previous day: 08/25 0701 - 08/26 0700 In: 855 [P.O.:140; I.V.:715] Out: 350 [Urine:350]  IV Fluid Intake:     . sodium chloride 75 mL/hr at 06/28/12 1900  . DOPamine 2 mcg/kg/min (06/29/12 0704)  . DISCONTD: nitroGLYCERIN Stopped (06/28/12 1422)  . DISCONTD: nitroGLYCERIN 50 mcg/min (06/29/12 0419)  . DISCONTD: phenylephrine (NEO-SYNEPHRINE) Adult infusion      MEDICATIONS    . aspirin  81 mg Oral Daily  . atorvastatin  40 mg Oral q1800  . fentaNYL      . heparin      . hydrALAZINE  10 mg Intravenous Once  . lidocaine      . midazolam      .  morphine injection  2 mg Intravenous Once  . nitroGLYCERIN      . potassium chloride      . potassium chloride      . sodium chloride  3 mL Intravenous Q12H  . DISCONTD: carvedilol  3.125 mg Oral BID WC  . DISCONTD: lisinopril  10 mg Oral Daily   PRN:  sodium chloride, acetaminophen, ALPRAZolam, morphine injection, ondansetron (ZOFRAN) IV, sodium chloride, traMADol, DISCONTD: hydrALAZINE  Diet:  NPO Activity:  OOB to chair with assistance DVT Prophylaxis:  NONE currently  CLINICALLY SIGNIFICANT STUDIES Basic Metabolic Panel:   Lab 06/28/12 1408  NA 137  K 3.5  CL 99  CO2 30  GLUCOSE 109*  BUN 11  CREATININE 0.79  CALCIUM 9.1  MG 2.2  PHOS --   Liver Function Tests:   Lab 06/28/12 1408  AST 17  ALT 13  ALKPHOS 92  BILITOT 0.5  PROT 6.9  ALBUMIN 3.5   CBC:   Lab 06/28/12 1408  WBC 12.9*  NEUTROABS 7.2  HGB 14.5  HCT 40.9  MCV 81.6  PLT 255   Coagulation:   Lab 06/28/12  1408  LABPROT 13.6  INR 1.02   Cardiac Enzymes:   Lab 06/29/12 0430 06/28/12 2025 06/28/12 1408  CKTOTAL 61 79 106  CKMB 3.6 3.7 4.3*  CKMBINDEX -- -- --  TROPONINI <0.30 <0.30 <0.30   Urinalysis:   Lab 06/28/12 1831  COLORURINE YELLOW  LABSPEC >1.046*  PHURINE 7.0  GLUCOSEU NEGATIVE  HGBUR NEGATIVE  BILIRUBINUR NEGATIVE  KETONESUR NEGATIVE  PROTEINUR NEGATIVE  UROBILINOGEN 1.0  NITRITE NEGATIVE  LEUKOCYTESUR NEGATIVE   Lipid Panel No results found for this basename: chol,  trig,  hdl,  cholhdl,  vldl,  ldlcalc   HgbA1C  Lab Results  Component Value Date   HGBA1C 6.7* 06/28/2012    Urine Drug Screen:   No results found for this basename: labopia,  cocainscrnur,  labbenz,  amphetmu,  thcu,  labbarb    Alcohol Level: No results found for this basename: ETH:2 in the last 168  hours  CT of the brain   06/29/2012  White matter changes as above.  No definite evidence of acute intracranial abnormality. MRI has increased sensitivity for acute ischemia if clinical concern persists.    06/28/2012  No evidence for acute hemorrhage.  No evidence for a large infarct.  Subtle areas of low density in the white matter may represent chronic small vessel ischemic changes.   MRI of the brain    MRA of the brain    2D Echocardiogram    Carotid Doppler  No internal carotid artery stenosis bilaterally. Vertebrals with antegrade flow bilaterally. Stenosis noted in the left distal ICA in the 40-59% range by velocity, probably due to vessel tortuosity.   CXR  06/28/2012  No acute chest findings.   EKG  Marked sinus bradycardia. Possible Left atrial enlargement. Right bundle branch block. Left ventricular hypertrophy with repolarization abnormality. ST elevation consider anterior injury or acute infarct.   Therapy Recommendations PT - ; OT - ; ST -   Physical Exam   Aged Caucasian male in distress do to severe nausea.Awake alert. Afebrile. Head is nontraumatic. Neck is supple without bruit. Hearing is normal. Cardiac exam no murmur or gallop. Lungs are clear to auscultation. Distal pulses are well felt.  Neurological Exam : awake alert oriented to time place and person. No aphasia. Moderate dysarthria but can be understood. Extraocular moments are full range without nystagmus with slight a right gaze preference. He blinks to threat bilaterally. Moderate left lower facial weakness. Palate elevates normally. Cough and gag are mildly weak. Dense left hemiplegia with 1/5 strength only. Purposeful antigravity right-sided movements. Diminished sensation on the left. Left plantar is upgoing and right is downgoing. Gait was not tested. ASSESSMENT [name] is a 51 y.o. male presenting with left arm hemiparesis and ataxia in setting of acute MI. 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 Infarct felt to be thrombotic at this time. Work up underway. On no antiplatlets prior to admission. Now on aspirin 81 mg orally every day for secondary stroke prevention. Patient with resultant left hemiparesis, nausea.  -HgbA1c 6.7  -hx hypertension, hypotension this am secondary to NTG drip and antihypertensives. 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 No MI. Most liekly related to HTN -NICM, EF 35% -recent wt loss, non-intentional   Hospital day # 1  TREATMENT/PLAN -Continue aspirin 81 mg orally every day for secondary stroke prevention. -OOB. Therapy evals. -wean dopamine. Keep SBP > 120 -check lipids -Lovenox for VTE prophy -MRI, MRA, 2D,  CD  Annie Main, MSN, RN, ANVP-BC, ANP-BC, [facility] Pager: [phone] 06/29/2012 9:32 AM  Scribe for Dr. Delia Heady, Stroke Center Medical Director, who has personally reviewed chart, pertinent data, examined the patient and developed the plan of care. Pager:  [phone]

## 2012-06-29 NOTE — Progress Notes (Signed)
Informed neurologist, pt's BP dropped from SBP >140 to 106. Ordered bolus. Pt's BP continue to drop to 97/49 post bolus administration.Informed neurologist, he stated he will call pt's cardiologist.

## 2012-06-29 NOTE — Progress Notes (Signed)
Inpatient Diabetes Program Recommendations  AACE/ADA: New Consensus Statement on Inpatient Glycemic Control (2013)  Target Ranges:  Prepandial:   less than 140 mg/dL      Peak postprandial:   less than 180 mg/dL (1-2 hours)      Critically ill patients:  140 - 180 mg/dL   Reason for Visit: No diagnosis of diabetes  Inpatient Diabetes Program Recommendations HgbA1C: Hbg A1c is 6.7 which is indicative of diabetes  Note: Per H & P, patient has history of "borderline diabetes".  Lab glucose was 109 mg/dl.  Hgb A1c of 6.7 indicates mean glucose of 146 mg/dl.  Request MD complete Glycemic Control Order Set.  The American Diabetes Association's Standards of care recommend that patients with diabetes have a minimum of four CBG's done daily.  Thank you. Cataleyah Colborn S. Elsie Lincoln, RN, CNS, CDE  [phone])

## 2012-06-29 NOTE — Evaluation (Signed)
Physical Therapy Evaluation Patient Details Name: Glenn Maldonado MRN: [id number] DOB: 1961-03-25 Today's Date: 06/29/2012 Time: 4098-1191 PT Time Calculation (min): 46 min  PT Assessment / Plan / Recommendation Clinical Impression  51 y.o. male admitted to [facility] with left sided weakness dx with CVA.  MRI pending.  Pt. Presents with decreased use of his left side, intact sensation and increased flexor synergy tone.  He was able to sit EOB without much difficulty , but due to decreased ability to use his left side and only one person able to assist we did not attempt standing today.  He would benefit from all three therapies in the inpatient rehab setting.      PT Assessment  Patient needs continued PT services    Follow Up Recommendations  Inpatient Rehab    Barriers to Discharge        Equipment Recommendations  Rolling walker with 5" wheels;Wheelchair (measurements) (with left platform, AFO left foot)    Recommendations for Other Services Rehab consult   Frequency Min 4X/week    Precautions / Restrictions Precautions Precautions: Fall   Pertinent Vitals/Pain Reports mid back pain from being in the bed 5/10, massage and sitting EOB helped decrease the pain.        Mobility  Bed Mobility Bed Mobility: Supine to Sit;Sitting - Scoot to Edge of Bed;Sit to Supine Supine to Sit: 3: Mod assist;HOB elevated;With rails Sitting - Scoot to Edge of Bed: 3: Mod assist Sit to Supine: 3: Mod assist Details for Bed Mobility Assistance: mod assist to help pt use his right arm to pull to long sitting, he then used the rail as I helped progress his left leg and hips to EOB.   To get back in the bed he just fell over and needed max assist of both of his legs.  To scoot up he was mod assist with bed in trendelenberg and mattress max inflate.   Transfers Transfers: Not assessed (not tested secondary to impulsivity and only one person )    Exercises     PT Diagnosis: Difficulty walking;Abnormality  of gait;Generalized weakness;Acute pain;Hemiplegia dominant side;Altered mental status  PT Problem List: Decreased strength;Decreased range of motion;Decreased activity tolerance;Decreased balance;Decreased mobility;Decreased coordination;Decreased cognition;Decreased knowledge of use of DME;Decreased safety awareness;Impaired tone;Pain PT Treatment Interventions: DME instruction;Gait training;Functional mobility training;Therapeutic activities;Therapeutic exercise;Balance training;Neuromuscular re-education;Cognitive remediation;Patient/family education   PT Goals Acute Rehab PT Goals PT Goal Formulation: With patient/family Time For Goal Achievement: 07/13/12 Potential to Achieve Goals: Good Pt will go Supine/Side to Sit: with modified independence PT Goal: Supine/Side to Sit - Progress: Goal set today Pt will Sit at [facility] of Bed: with supervision;with unilateral upper extremity support PT Goal: Sit at Edge Of Bed - Progress: Goal set today Pt will go Sit to Supine/Side: with modified independence;with rail PT Goal: Sit to Supine/Side - Progress: Goal set today Pt will go Sit to Stand: with min assist;with upper extremity assist PT Goal: Sit to Stand - Progress: Goal set today Pt will go Stand to Sit: with min assist PT Goal: Stand to Sit - Progress: Goal set today Pt will Transfer Bed to Chair/Chair to Bed: with min assist PT Transfer Goal: Bed to Chair/Chair to Bed - Progress: Goal set today Pt will Ambulate: 1 - 15 feet;with +2 total assist;with rolling walker (left platform RW, pt 70%) PT Goal: Ambulate - Progress: Goal set today  Visit Information  Last PT Received On: 06/29/12 Assistance Needed: +2    Subjective Data  Subjective: Pt reports  that he wants to eat.   Patient Stated Goal: to get better, go home   Prior Functioning  Home Living Lives With: Spouse Available Help at Discharge: Family Additional Comments: needs to be further assessed.   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      Extremity/Trunk Assessment Right Lower Extremity Assessment RLE ROM/Strength/Tone: Within functional levels RLE Sensation: WFL - Light Touch RLE Coordination: WFL - gross/fine motor Left Lower Extremity Assessment LLE ROM/Strength/Tone: Deficits LLE ROM/Strength/Tone Deficits: increased flexion synergy and tone in his left leg.  Ankle is plantar flexed and knee is flexed/hip externally rorated in the bed.   LLE Sensation: WFL - Light Touch LLE Coordination: Deficits LLE Coordination Deficits: unable to actively move the leg on his own.   Trunk Assessment Trunk Assessment: Normal;Other exceptions (decreased trunk stability in sitting.  )   Balance Static Sitting Balance Static Sitting - Balance Support: Right upper extremity supported;Feet supported Static Sitting - Level of Assistance: 3: Mod assist Static Sitting - Comment/# of Minutes: 20 mins EOB with mod assist when pt would let go of the right rail without warning.  He would fall posteriorly and left  End of Session PT - End of Session Activity Tolerance: Patient limited by fatigue;Treatment limited secondary to agitation;Treatment limited secondary to medical complications (Comment) (nauseated upon sitting) Patient left: in bed;with call bell/phone within reach Nurse Communication: Mobility status;Other (comment) (request for [facility])  GP     Letonya Mangels B.  Aymar Whitfill, PT, DPT [phone]   06/29/2012, 3:56 PM

## 2012-06-30 ENCOUNTER — Inpatient Hospital Stay (HOSPITAL_COMMUNITY): Payer: Medicaid - Out of State

## 2012-06-30 DIAGNOSIS — I6330 Cerebral infarction due to thrombosis of unspecified cerebral artery: Secondary | ICD-10-CM

## 2012-06-30 DIAGNOSIS — G2589 Other specified extrapyramidal and movement disorders: Secondary | ICD-10-CM

## 2012-06-30 DIAGNOSIS — R29898 Other symptoms and signs involving the musculoskeletal system: Secondary | ICD-10-CM

## 2012-06-30 LAB — LIPID PANEL
HDL: 36 mg/dL — ABNORMAL LOW (ref >39)
LDL Cholesterol: 88 mg/dL (ref 0–99)
Triglycerides: 92 mg/dL (ref <150)
VLDL: 18 mg/dL (ref 0–40)

## 2012-06-30 LAB — CARDIAC PANEL(CRET KIN+CKTOT+MB+TROPI)
CK, MB: 3.8 ng/mL (ref 0.3–4.0)
Relative Index: INVALID (ref 0.0–2.5)
Total CK: 59 U/L (ref 7–232)

## 2012-06-30 MED ORDER — ASPIRIN 81 MG PO CHEW
81.0000 mg | CHEWABLE_TABLET | Freq: Every day | ORAL | Status: DC
  Administered 2012-06-30 – 2012-07-01 (×2): 81 mg via ORAL
  Filled 2012-06-30 (×2): qty 1

## 2012-06-30 MED ORDER — LORAZEPAM 2 MG/ML IJ SOLN
1.0000 mg | INTRAMUSCULAR | Status: AC
  Administered 2012-06-30: 1 mg via INTRAVENOUS
  Filled 2012-06-30: qty 1

## 2012-06-30 MED ORDER — RESOURCE THICKENUP CLEAR PO POWD
ORAL | Status: DC | PRN
  Filled 2012-06-30: qty 125

## 2012-06-30 MED ORDER — CLONAZEPAM 0.5 MG PO TABS
0.5000 mg | ORAL_TABLET | Freq: Every day | ORAL | Status: DC
  Administered 2012-06-30: 0.5 mg via ORAL
  Filled 2012-06-30: qty 1

## 2012-06-30 NOTE — Progress Notes (Addendum)
Stroke Team Progress Note  HISTORY Glenn Maldonado is an 51 y.o. male with a history of hypertension, MI, DM, tobacco abuse, OSA presented 06/28/2012 to [facility] with ST elevations on EKG in the setting of left-sided paresthesias and weakness. He was transferred to [facility] emergently for cardiac catheterization. After catheterization which did not show any coronary artery blockage, was recommended that he had persistent left-sided weakness. He therefore got a head CT which is negative, and is scheduled for an MRI today.   Just before 4 AM 06/29/2012, he was able to converse without much dysarthria, then it was noticed that he was having significant dysarthria and therefore code stroke was called at 4:53.   Patient has also had some shortness of breath, and nausea and vomiting.  Patient was not a TPA candidate secondary to symptoms present since the day prior to consult (per history woke with symptoms Sunday morning; dx missed on arrival to [facility]).  SUBJECTIVE His wife at the bedside.  Overall he feels his condition is stable. He has significant nausea and vomiting yesterday- which is better today. Was agitated yesterday and so did not get MRI. Denies any new chest pain, short of breath, abdominal pain OBJECTIVE Most recent Vital Signs: Filed Vitals:   06/30/12 0700 06/30/12 0754 06/30/12 0800 06/30/12 0900  BP: 161/102  169/90 183/90  Pulse:  75    Temp:  98.4 F (36.9 C)    TempSrc:  Oral    Resp: 14   17  Height:      Weight:      SpO2:  95%     CBG (last 3)   Basename 06/30/12 0341 06/29/12 2352 06/29/12 2000  GLUCAP 123* 122* 98   Intake/Output from previous day: 08/26 0701 - 08/27 0700 In: 1001.6 [I.V.:999.6; IV Piggyback:2] Out: 1900 [Urine:1900]  IV Fluid Intake:      . sodium chloride 75 mL/hr at 06/28/12 1900  . dextrose 5 % and 0.45% NaCl 75 mL/hr (06/30/12 0739)  . DOPamine 2 mcg/kg/min (06/29/12 1333)   MEDICATIONS     . aspirin  150 mg Rectal Daily  .  atorvastatin  40 mg Oral q1800  . enoxaparin (LOVENOX) injection  40 mg Subcutaneous Q24H  . LORazepam  1 mg Intravenous On Call  . sodium chloride  3 mL Intravenous Q12H  . DISCONTD: aspirin  81 mg Oral Daily   PRN:  sodium chloride, acetaminophen, ALPRAZolam, morphine injection, ondansetron (ZOFRAN) IV, sodium chloride, traMADol  Diet:  NPO Activity:  OOB to chair with assistance DVT Prophylaxis:  NONE currently  CLINICALLY SIGNIFICANT STUDIES Basic Metabolic Panel:   Lab 06/28/12 1443 06/28/12 1408  NA 140 137  K 3.3* 3.5  CL 101 99  CO2 -- 30  GLUCOSE 109* 109*  BUN 10 11  CREATININE 0.80 0.79  CALCIUM -- 9.1  MG -- 2.2  PHOS -- --   Liver Function Tests:   Lab 06/28/12 1408  AST 17  ALT 13  ALKPHOS 92  BILITOT 0.5  PROT 6.9  ALBUMIN 3.5   CBC:   Lab 06/28/12 1443 06/28/12 1408  WBC -- 12.9*  NEUTROABS -- 7.2  HGB 14.3 14.5  HCT 42.0 40.9  MCV -- 81.6  PLT -- 255   Coagulation:   Lab 06/28/12 1408  LABPROT 13.6  INR 1.02   Cardiac Enzymes:   Lab 06/30/12 0600 06/30/12 0248 06/29/12 1313  CKTOTAL 55 56 66  CKMB 3.6 3.5 3.4  CKMBINDEX -- -- --  TROPONINI <0.30 <  0.30 <0.30   Urinalysis:   Lab 06/28/12 1831  COLORURINE YELLOW  LABSPEC >1.046*  PHURINE 7.0  GLUCOSEU NEGATIVE  HGBUR NEGATIVE  BILIRUBINUR NEGATIVE  KETONESUR NEGATIVE  PROTEINUR NEGATIVE  UROBILINOGEN 1.0  NITRITE NEGATIVE  LEUKOCYTESUR NEGATIVE   Lipid Panel    Component Value Date/Time   CHOL 142 06/30/2012 0248   HgbA1C  Lab Results  Component Value Date   HGBA1C 6.8* 06/29/2012    Urine Drug Screen:   No results found for this basename: labopia,  cocainscrnur,  labbenz,  amphetmu,  thcu,  labbarb    Alcohol Level: No results found for this basename: ETH:2 in the last 168 hours  CT of the brain   06/29/2012  White matter changes as above.  No definite evidence of acute intracranial abnormality. MRI has increased sensitivity for acute ischemia if clinical concern  persists.    06/28/2012  No evidence for acute hemorrhage.  No evidence for a large infarct.  Subtle areas of low density in the white matter may represent chronic small vessel ischemic changes.   MRI of the brain    MRA of the brain    2D Echocardiogram  Left ventricle: The cavity size was normal. There was severe concentric hypertrophy. Systolic function was normal. The estimated ejection fraction was in the range of 50% to 55%. Lateral hypokinesis. Doppler parameters are consistent with abnormal left ventricular relaxation (grade 1 diastolic dysfunction). - Aortic valve: Sclerosis without stenosis. Trivial regurgitation. - Mitral valve: Mildly thickened leaflets . Trivial regurgitation. - Left atrium: Severelydilated (>40 ml/m2). - Right atrium: Moderately dilated. - Tricuspid valve: Trivial regurgitation. - Pulmonary arteries: PA peak pressure: 46mm Hg (S). - Systemic veins: The IVC measures >2.1 cm, but collapses >50%, suggesting an elevated RA pressure of 10 mmHg.    Carotid Doppler  No internal carotid artery stenosis bilaterally. Vertebrals with antegrade flow bilaterally. Stenosis noted in the left distal ICA in the 40-59% range by velocity, probably due to vessel tortuosity.   CXR  06/28/2012  No acute chest findings.   EKG  Marked sinus bradycardia. Possible Left atrial enlargement. Right bundle branch block. Left ventricular hypertrophy with repolarization abnormality. ST elevation consider anterior injury or acute infarct.   Therapy Recommendations PT - CIR ; OT - ; ST - MBS  Physical Exam   Aged Caucasian male in distress do to severe nausea.Awake alert. Afebrile. Head is nontraumatic. Neck is supple without bruit. Hearing is normal. Cardiac exam no murmur or gallop. Lungs are clear to auscultation. Distal pulses are well felt.  Neurological Exam : awake alert oriented to time place and person. No aphasia. Moderate dysarthria but can be understood.  Extraocular  moments are full range with saccadic dysmetria with slight a right gaze preference. He blinks to threat bilaterally.  Moderate left lower facial weakness. Palate elevates normally. Cough and gag are mildly weak. Dense left hemiplegia with 1/5 strength only. Purposeful antigravity right-sided movements. Diminished sensation on the left. Left plantar is upgoing and right is downgoing. Gait was not tested. Moving L toes today.  ASSESSMENT [name] is a 51 y.o. male presenting with left arm hemiparesis and ataxia in setting of acute MI. Infarct likely in the right subcortical region likely Pons; MRI pending. Infarct felt to be thrombotic at this time. Work up underway. On no antiplatlets prior to admission. Now on aspirin 81 mg orally every day for secondary stroke prevention. Patient with resultant left hemiparesis, nausea.  -HgbA1c 6.8  -hx hypertension,  hypotension this am secondary to NTG drip and antihypertensives. 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 No MI. Most liekly related to HTN -NICM, EF- 50-55% -recent wt loss, non-intentional   Hospital day # 2  TREATMENT/PLAN -Continue aspirin 81 mg orally every day for secondary stroke prevention. -CIR consult per PT - Keep SBP > 120 2D ECHO-  - lipids- LDL-88, A1C- 6.8 - Lovenox for VTE prophy - need MRI, MRA- pt agitated yesterday- Ativan before procedure-- will try to get today.  PATEL,RAVI MD  06/30/2012 9:37 AM  Scribe for Dr. Delia Heady, Stroke Center Medical Director, who has personally reviewed chart, pertinent data, examined the patient and developed the plan of care. Pager:  [phone]

## 2012-06-30 NOTE — Procedures (Signed)
Objective Swallowing Evaluation: Modified Barium Swallowing Study  Patient Details  Name: Glenn Maldonado MRN: [id number] Date of Birth: 05-Dec-1960  Today's Date: 06/30/2012 Time: 1350-1415 SLP Time Calculation (min): 25 min  Past Medical History:  Past Medical History  Diagnosis Date  . Hypertension   . Borderline diabetic   . STEMI (ST elevation myocardial infarction) 06/28/2012  . HTN (hypertension), uncontrolled 06/28/2012  . Leg weakness, bilateral 06/28/2012  . Tobacco abuse 06/28/2012  . Weight loss, non-intentional 06/28/2012  . OSA (obstructive sleep apnea), history of, but with recent weight loss less symptoms 06/28/2012  . Hepatitis C 2001    treated with interferon  . NICM (nonischemic cardiomyopathy), EF 35% at cardiac cath 06/28/2012   Past Surgical History: History reviewed. No pertinent past surgical history. HPI:        Assessment / Plan / Recommendation Clinical Impression  Dysphagia Diagnosis: Moderate oral phase dysphagia;Moderate pharyngeal phase dysphagia Clinical impression: Patient exhibits a moderate oropharyngeal sensori-motor based dysphagia characterized by reduced labilal and lingual strength and coordination for solids and thin liquids, delayed swallow initiation, and reduced laryngeal elevation during the swallow, resulting in inability to adequately chew solids, difficulty moving pills posteriorly in the oral cavity, penetration of nectar thick liquids with no cough response, and silent aspiration of thin liquids.  Cough on command did not clear the aspirated material.  A chin tuck did not prevent penetration of nectars.  Patient has decreased attention and is impulsive.     Treatment Recommendation  F/U MBS in ___ days (Comment);Other (Comment)    Diet Recommendation Dysphagia 2 (Fine chop);Honey-thick liquid   Liquid Administration via: Cup Medication Administration: Whole meds with puree Supervision: Patient able to self feed;Full supervision/cueing  for compensatory strategies Compensations: Slow rate;Small sips/bites;Check for pocketing;Check for anterior loss Postural Changes and/or Swallow Maneuvers: Seated upright 90 degrees    Other  Recommendations Oral Care Recommendations: Oral care QID Other Recommendations: Order thickener from pharmacy;Prohibited food (jello, ice cream, thin soups);Have oral suction available;Clarify dietary restrictions   Follow Up Recommendations  Inpatient Rehab;24 hour supervision/assistance    Frequency and Duration min 3x week  2 weeks   Pertinent Vitals/Pain n/a    SLP Swallow Goals Patient will consume recommended diet without observed clinical signs of aspiration with: Moderate assistance Patient will utilize recommended strategies during swallow to increase swallowing safety with: Moderate assistance   General Type of Study: Modified Barium Swallowing Study Reason for Referral: Objectively evaluate swallowing function Previous Swallow Assessment: See BSE of 8/27 am. Diet Prior to this Study: NPO Temperature Spikes Noted: No Respiratory Status: Supplemental O2 delivered via (comment) History of Recent Intubation: No Behavior/Cognition: Alert;Cooperative;Impulsive;Distractible;Requires cueing;Decreased sustained attention Oral Cavity - Dentition: Poor condition;Missing dentition Oral Motor / Sensory Function: Impaired - see Bedside swallow eval Self-Feeding Abilities: Able to feed self;Needs set up Patient Positioning: Upright in chair Baseline Vocal Quality: Clear Volitional Cough: Weak Volitional Swallow: Able to elicit Anatomy: Within functional limits Pharyngeal Secretions: Not observed secondary MBS    Reason for Referral Objectively evaluate swallowing function   Oral Phase     Pharyngeal Phase Pharyngeal Phase: Impaired   Cervical Esophageal Phase    GO    Cervical Esophageal Phase: Vicente Masson T 06/30/2012, 2:50 PM

## 2012-06-30 NOTE — Evaluation (Signed)
Occupational Therapy Evaluation Patient Details Name: Glenn Maldonado MRN: [id number] DOB: [birth year] Today's Date: 06/30/2012 Time: 4098-1191 OT Time Calculation (min): 23 min  OT Assessment / Plan / Recommendation Clinical Impression  51 y.o. male admitted to [facility] with left sided weakness dx with CVA.  MRI pending. Pt will benefit from OT to increase functional use LUE, as well as ADL activity. Pt will need inpatient rehab.    OT Assessment  Patient needs continued OT Services    Follow Up Recommendations  Inpatient Rehab       Equipment Recommendations  3 in 1 bedside comode    Recommendations for Other Services Rehab consult  Frequency  Min 2X/week    Precautions / Restrictions Precautions Precaution Comments: flaccid LUE.         ADL  Transfers/Ambulation Related to ADLs: Limited ADL eval performed as pt in bed with eyes closed.  OT eval focused on LUE and education regarding positioning and PROM with wife. Eyes closed 90 percent of OT eval. ADL Comments: OT educated wife on proper positioning of LUE, as well as gentle PROM.  Will need to further eval this pt as pt can tolerate    OT Diagnosis: Generalized weakness;Hemiplegia non-dominant side  OT Problem List: Decreased strength;Decreased range of motion;Decreased activity tolerance;Impaired UE functional use;Impaired balance (sitting and/or standing) OT Treatment Interventions: Self-care/ADL training;Therapeutic exercise;Neuromuscular education;DME and/or AE instruction;Patient/family education   OT Goals Acute Rehab OT Goals Time For Goal Achievement: 08/11/12 Miscellaneous OT Goals Miscellaneous OT Goal #1: Pt and wife will PROM, as well as proper positioning for LUE to maintain joint integrtiy, prevent swelling, and protect LUE. Miscellaneous OT Goal #2: Pt will further tolerate OT eval   Visit Information  Last OT Received On: 06/30/12    Subjective Data  Subjective: I am not doing so good. (pt in bed with eyes  closed)   Prior Functioning  Vision/Perception  Home Living Lives With: Spouse Available Help at Discharge: Family Type of Home: House Additional Comments: needs to be further assessed.   Prior Function Level of Independence: Independent Able to Take Stairs?: Yes Driving: Yes Vocation: Full time employment Communication Communication: Expressive difficulties Dominant Hand: Right      Cognition  Overall Cognitive Status: Impaired Arousal/Alertness: Lethargic Behavior During Session: Lethargic    Extremity/Trunk Assessment Left Upper Extremity Assessment LUE ROM/Strength/Tone: Deficits LUE ROM/Strength/Tone Deficits: trace movement noted in pts elbow flexion/extension, as well as wrist and hand.  Pts wife did report hand movement earlier in the day.  OT would describe as trace movement only            End of Session  Pt left in bed with wife present  GO     Naveen Clardy, [place] 06/30/2012, 1:24 PM

## 2012-06-30 NOTE — Progress Notes (Signed)
Physical Therapy Treatment Patient Details Name: Glenn Maldonado MRN: [id number] DOB: 27-Nov-1960 Today's Date: 06/30/2012 Time: 4098-1191 PT Time Calculation (min): 28 min  PT Assessment / Plan / Recommendation Comments on Treatment Session  Able to progress mobility today- initiated sit<>stand transfers.  Cont to feel pt would strongly benefit from CIR.      Follow Up Recommendations  Inpatient Rehab    Barriers to Discharge        Equipment Recommendations  3 in 1 bedside comode    Recommendations for Other Services Rehab consult  Frequency Min 4X/week   Plan Discharge plan remains appropriate    Precautions / Restrictions Precautions Precautions: Fall Precaution Comments: flaccid LUE.   Restrictions Weight Bearing Restrictions: No       Mobility  Bed Mobility Bed Mobility: Supine to Sit;Sitting - Scoot to Edge of Bed;Sit to Supine Supine to Sit: 3: Mod assist;With rails Sitting - Scoot to Edge of Bed: 2: Max assist Sit to Supine: 2: Max assist;HOB flat;With rail Details for Bed Mobility Assistance: Assist for LE's, to lift shoulders/trunk to sitting upright, lateral weight shifting to scoot hips closer to EOB, trunk stability.  Cues for sequencing, technique.  No use of LUE but had pt place Lt hand on bed while therapist assisted with maintaining position of Lt UE while scooting to EOB.   Transfers Transfers: Sit to Stand;Stand to Sit Sit to Stand: 1: +2 Total assist;From bed Sit to Stand: Patient Percentage: 40% Stand to Sit: 1: +2 Total assist;With upper extremity assist;To bed Stand to Sit: Patient Percentage: 30% Details for Transfer Assistance: blocking pt's Lt knee & supporting Lt UE.   Assist to achieve standing, balance, anterior translation of trunk over BOS, cues for hip extension/tall posture, Upon standing pt's Lt LE with poor control & muscle activation to maintain position requiring assistance to reposition, & controlled descent.    Ambulation/Gait Ambulation/Gait Assistance: Not tested (comment)      PT Goals Acute Rehab PT Goals Time For Goal Achievement: 07/13/12 Potential to Achieve Goals: Good PT Goal: Supine/Side to Sit - Progress: Not met PT Goal: Sit at Edge Of Bed - Progress: Progressing toward goal PT Goal: Sit to Supine/Side - Progress: Not met PT Goal: Sit to Stand - Progress: Progressing toward goal PT Goal: Stand to Sit - Progress: Progressing toward goal  Visit Information  Last PT Received On: 06/30/12 Assistance Needed: +2    Subjective Data  Subjective: "I need to get up" Patient Stated Goal: to get better, go home   Cognition  Overall Cognitive Status: Impaired Area of Impairment: Safety/judgement Arousal/Alertness: Lethargic Behavior During Session: Lethargic Safety/Judgement: Decreased awareness of safety precautions;Decreased safety judgement for tasks assessed;Impulsive    Balance  Static Sitting Balance Static Sitting - Balance Support: Right upper extremity supported;Bilateral upper extremity supported;Feet supported Static Sitting - Level of Assistance: 3: Mod assist Static Sitting - Comment/# of Minutes: Cues/assist for postural control due to pt with LOB to Lt & posteriorly.  Pt performed trunk flexion<>sitting upright 8x's to facilitate core activation, also performed weight bearing through L<>R UE's.  Pt able to sit without assist x ~30 secs before fatiguing & requring assist.    End of Session PT - End of Session Equipment Utilized During Treatment: Gait belt Activity Tolerance: Patient limited by fatigue;Patient tolerated treatment well Patient left: in bed;with call bell/phone within reach;with family/visitor present Nurse Communication: Mobility status    Verdell Face, [place] [phone] 06/30/2012

## 2012-06-30 NOTE — Progress Notes (Signed)
SBP >200, on call MD (Dr Allyson Sabal) notified and updated on neuro's goal no further orders, will cont to monitor closely.

## 2012-06-30 NOTE — Progress Notes (Signed)
Pt was transported to MRI for MRI of the head. He received Ativan 1 mg iv pre procedure. While in MRI, the MRI staff called to unit stating that pt was agitated and would not lay still for the procedure so the procedure was terminated.

## 2012-06-30 NOTE — Evaluation (Signed)
Speech Language Pathology Evaluation Patient Details Name: Glenn Maldonado MRN: [id number] DOB: May 15, 1961 Today's Date: 06/30/2012 Time: 7829-5621 SLP Time Calculation (min): 15 min  Problem List:  Patient Active Problem List  Diagnosis  . 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 HTN (hypertension), uncontrolled  . Leg weakness, bilateral  . Tobacco abuse  . Weight loss, non-intentional  . OSA (obstructive sleep apnea), history of, but with recent weight loss less symptoms  . NICM (nonischemic cardiomyopathy), EF 35% at cardiac cath  . CVA, possible, w/u progress  . Hypotension, now on Dopamine per Neuro   Past Medical History:  Past Medical History  Diagnosis Date  . Hypertension   . Borderline diabetic   . STEMI (ST elevation myocardial infarction) 06/28/2012  . HTN (hypertension), uncontrolled 06/28/2012  . Leg weakness, bilateral 06/28/2012  . Tobacco abuse 06/28/2012  . Weight loss, non-intentional 06/28/2012  . OSA (obstructive sleep apnea), history of, but with recent weight loss less symptoms 06/28/2012  . Hepatitis C 2001    treated with interferon  . NICM (nonischemic cardiomyopathy), EF 35% at cardiac cath 06/28/2012   Past Surgical History: History reviewed. No pertinent past surgical history. HPI:  51 y.o. male with history of HTN, Hep C, admitted on 06/28/12 pm with complaints of chest pressure and difficulty walking as well as left arm heaviness with tingling sensation . BP elevated at 178/118  EKG with ST changes and patient started on IV heparin and CT head done without evidence of bleed.  Patient underwent cardiac cath revealing global hypokinesis with EF 40-45% and non obstructive CAD. Patient with hypotension with BP drop to 97/49 despite fluid bolus. Started on dopamine for stabilization.  He developed facial droop with dysarthria and worsening of left sided weakness. Neurology consulted and felt that patient likely has  thrombotic infarct and workup initiated. Carotid dopplers done revealing 40-59% left distal ICA stenosis. For MRI/MRA brain today.    Assessment / Plan / Recommendation Clinical Impression  Cognitive-linguistic evaluation complete. Patient presents with impairements affecting speech intelligibility, selective attention, safety awareness, and reasoning s/p acute CVA. Patient will benefit from skilled SLP f/u in the acute care setting to maximize functional recovery of cognition as well as CIR f/u after d/c in order to safely return home. Will continue to f/u.     SLP Assessment  Patient needs continued Speech Lanaguage Pathology Services    Follow Up Recommendations  Inpatient Rehab    Frequency and Duration min 2x/week  2 weeks   Pertinent Vitals/Pain n/a   SLP Goals  SLP Goals Potential to Achieve Goals: Good Progress/Goals/Alternative treatment plan discussed with pt/caregiver and they: Agree SLP Goal #1: Patient will demonstrate selective attention to functional ADLs in a mildly distracting environment  with mod cues SLP Goal #1 - Progress: Not met SLP Goal #2: Patient will utilize speech intelligibility strategies during conversation with min verbal cues SLP Goal #2 - Progress: Not met SLP Goal #3: Patient will demonstrate safety awareness during familiar and functional ADLs  with min cues SLP Goal #3 - Progress: Not met  SLP Evaluation Prior Functioning  Cognitive/Linguistic Baseline: Within functional limits Lives With: Spouse Available Help at Discharge: Family Vocation: Full time employment   Cognition  Overall Cognitive Status: Impaired Arousal/Alertness: Awake/alert Orientation Level: Oriented to person;Oriented to place;Oriented to time;Oriented to situation Attention: Focused;Sustained;Selective Focused Attention: Appears intact Sustained Attention: Impaired Sustained Attention Impairment: Verbal complex;Functional complex Selective Attention:  Impaired Selective  Attention Impairment: Verbal complex;Functional complex;Verbal basic;Functional basic (distracted by environmental stimuli) Memory: Appears intact Awareness: Impaired Awareness Impairment: Intellectual impairment (however improved by end of session with verbal cueing) Problem Solving: Impaired Problem Solving Impairment: Verbal complex;Functional complex Executive Function: Reasoning;Decision Making Reasoning: Impaired Reasoning Impairment: Verbal complex;Functional complex Decision Making: Impaired Decision Making Impairment: Functional complex Behaviors: Impulsive;Poor frustration tolerance Safety/Judgment: Impaired Comments: decreased safety awareness, impulsive    Comprehension  Auditory Comprehension Overall Auditory Comprehension: Appears within functional limits for tasks assessed Visual Recognition/Discrimination Discrimination: Not tested Reading Comprehension Reading Status: Not tested    Expression Expression Primary Mode of Expression: Verbal Verbal Expression Overall Verbal Expression: Appears within functional limits for tasks assessed   Oral / Motor Oral Motor/Sensory Function Overall Oral Motor/Sensory Function: Impaired Labial ROM: Reduced left Labial Symmetry: Abnormal symmetry left Labial Strength: Reduced Labial Sensation: Within Functional Limits Lingual ROM: Within Functional Limits Lingual Symmetry: Abnormal symmetry left Lingual Strength: Reduced Lingual Sensation: Within Functional Limits Facial ROM: Reduced left Facial Symmetry: Left droop Facial Strength: Reduced Facial Sensation: Within Functional Limits Velum: Within Functional Limits Mandible: Within Functional Limits Motor Speech Overall Motor Speech: Impaired Respiration: Within functional limits Phonation: Hoarse Resonance: Within functional limits Articulation: Impaired Level of Impairment: Conversation Intelligibility: Intelligibility reduced Conversation:  75-100% accurate Motor Planning: Witnin functional limits Motor Speech Errors: Not applicable Effective Techniques: Slow rate;Increased vocal intensity;Over-articulate (intelligibility decreased with rapid rate of speech )   GO   Ferdinand Lango MA, CCC-SLP [phone]   Ferdinand Lango Meryl 06/30/2012, 9:44 AM

## 2012-06-30 NOTE — Progress Notes (Signed)
[facility]  Subjective: His tongue hurts where he bit it.    Objective: Vital signs in last 24 hours: Temp:  [97.6 F (36.4 C)-98.7 F (37.1 C)] 98.3 F (36.8 C) (08/27 0400) Pulse Rate:  [55-97] 97  (08/26 2300) Resp:  [11-22] 14  (08/27 0700) BP: (120-180)/(58-106) 161/102 mmHg (08/27 0700) SpO2:  [92 %-99 %] 94 % (08/27 0400) Last BM Date:  (unknown)  Intake/Output from previous day: 08/26 0701 - 08/27 0700 In: 1001.6 [I.V.:999.6; IV Piggyback:2] Out: 1900 [Urine:1900] Intake/Output this shift:    Medications Current Facility-Administered Medications  Medication Dose Route Frequency Provider Last Rate Last Dose  . 0.9 %  sodium chloride infusion   Intravenous Continuous Marykay Lex, MD 75 mL/hr at 06/28/12 1900    . 0.9 %  sodium chloride infusion  250 mL Intravenous PRN Marykay Lex, MD      . acetaminophen (TYLENOL) tablet 650 mg  650 mg Oral Q4H PRN Marykay Lex, MD   650 mg at 06/28/12 2003  . ALPRAZolam Prudy Feeler) tablet 0.25 mg  0.25 mg Oral TID PRN Abelino Derrick, PA      . aspirin suppository 150 mg  150 mg Rectal Daily Chrystie Nose, MD   150 mg at 06/29/12 2216  . atorvastatin (LIPITOR) tablet 40 mg  40 mg Oral q1800 Marykay Lex, MD   40 mg at 06/28/12 1717  . dextrose 5 %-0.45 % sodium chloride infusion   Intravenous Continuous Marykay Lex, MD 75 mL/hr at 06/30/12 0739 75 mL/hr at 06/30/12 0739  . DOPamine (INTROPIN) 800 mg in dextrose 5 % 250 mL infusion  2-20 mcg/kg/min Intravenous Titrated Layne Benton, NP 3.1 mL/hr at 06/29/12 1333 2 mcg/kg/min at 06/29/12 1333  . enoxaparin (LOVENOX) injection 40 mg  40 mg Subcutaneous Q24H Layne Benton, NP   40 mg at 06/29/12 1100  . morphine 2 MG/ML injection 2 mg  2 mg Intravenous Q1H PRN Marykay Lex, MD   2 mg at 06/30/12 0113  . ondansetron (ZOFRAN) injection 4 mg  4 mg Intravenous Q6H PRN Marykay Lex, MD   4 mg at 06/29/12 1333  . sodium chloride 0.9 % injection  3 mL  3 mL Intravenous Q12H Marykay Lex, MD   3 mL at 06/29/12 2206  . sodium chloride 0.9 % injection 3 mL  3 mL Intravenous PRN Marykay Lex, MD      . traMADol Janean Sark) tablet 50 mg  50 mg Oral Q6H PRN Abelino Derrick, [place]      . DISCONTD: aspirin chewable tablet 81 mg  81 mg Oral Daily Marykay Lex, MD   81 mg at 06/28/12 1729  . DISCONTD: nitroGLYCERIN 0.2 mg/mL in dextrose 5 % infusion  50 mcg/min Intravenous Titrated Marykay Lex, MD 15 mL/hr at 06/29/12 0419 50 mcg/min at 06/29/12 0419    PE: General appearance: alert, cooperative and mild distress Lungs: clear to auscultation bilaterally Heart: regular rate and rhythm, S1, S2 normal, no murmur, click, rub or gallop Extremities: No lee Pulses: 2+ and symmetric Skin: Warm and dry Neurologic: Left sided hemiparesis.  He is able to move toes on left foot.  Lab Results:   Basename 06/28/12 1443 06/28/12 1408  WBC -- 12.9*  HGB 14.3 14.5  HCT 42.0 40.9  PLT -- 255   BMET  Basename 06/28/12 1443 06/28/12 1408  NA 140 137  K 3.3* 3.5  CL 101 99  CO2 -- 30  GLUCOSE 109* 109*  BUN 10 11  CREATININE 0.80 0.79  CALCIUM -- 9.1   PT/INR  Basename 06/28/12 1408  LABPROT 13.6  INR 1.02    Lipid Panel     Component Value Date/Time   CHOL 142 06/30/2012 0248   TRIG 92 06/30/2012 0248   HDL 36* 06/30/2012 0248   CHOLHDL 3.9 06/30/2012 0248   VLDL 18 06/30/2012 0248   LDLCALC 88 06/30/2012 0248   TTE,  Study Conclusions  - Left ventricle: The cavity size was normal. There was severe concentric hypertrophy. Systolic function was normal. The estimated ejection fraction was in the range of 50% to 55%. Lateral hypokinesis. Doppler parameters are consistent with abnormal left ventricular relaxation (grade 1 diastolic dysfunction). - Aortic valve: Sclerosis without stenosis. Trivial regurgitation. - Mitral valve: Mildly thickened leaflets . Trivial regurgitation. - Left atrium: Severelydilated (>40 ml/m2). - Right  atrium: Moderately dilated. - Tricuspid valve: Trivial regurgitation. - Pulmonary arteries: PA peak pressure: 46mm Hg (S). - Systemic veins: The IVC measures >2.1 cm, but collapses >50%, suggesting an elevated RA pressure of 10 mmHg. Transthoracic echocardiography. 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  MRI planned for this AM.  Echo reveals EF of 50-55%(8/26) compared to LV gram of 35%.   Nonobstructive CAD by cath.   Bradycardia: 48bpm  During apneic events.  Transfer to stepdown.   LOS: 2 days    HAGER, BRYAN 06/30/2012 7:50 AM  I have seen and examined the patient along with Wilburt Finlay PA.  I have reviewed the chart, notes and new data.  I agree with PA's note.  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 This might explain the rapid change in LV function (quick recovery from time of LV angio 8/25 until echo 8/26) and the dramatic ECG changes, despite the absence of significant CAD or cardiac enzyme release. In turn, one has to worry that his CVA was cardioembolic (circumstantial evidence only : dilated LA may place him at risk of LA thrombus). Clearly has untreated sleep apnea - previously diagnosed "it got a lot better" after weight loss according to him.   >35 minutes reviewing data and discussing  situation and plans with patient.  Thurmon Fair, MD, [facility] [phone] 06/30/2012, 8:25 AM

## 2012-06-30 NOTE — Evaluation (Signed)
Clinical/Bedside Swallow Evaluation Patient Details  Name: Glenn Maldonado MRN: [id number] Date of Birth: 09/08/1961  Today's Date: 06/30/2012 Time: 0850-0903 SLP Time Calculation (min): 13 min  Past Medical History:  Past Medical History  Diagnosis Date  . Hypertension   . Borderline diabetic   . STEMI (ST elevation myocardial infarction) 06/28/2012  . HTN (hypertension), uncontrolled 06/28/2012  . Leg weakness, bilateral 06/28/2012  . Tobacco abuse 06/28/2012  . Weight loss, non-intentional 06/28/2012  . OSA (obstructive sleep apnea), history of, but with recent weight loss less symptoms 06/28/2012  . Hepatitis C 2001    treated with interferon  . NICM (nonischemic cardiomyopathy), EF 35% at cardiac cath 06/28/2012   Past Surgical History: History reviewed. No pertinent past surgical history. HPI:  51 y.o. male with history of HTN, Hep C, admitted on 06/28/12 pm with complaints of chest pressure and difficulty walking as well as left arm heaviness with tingling sensation . BP elevated at 178/118  EKG with ST changes and patient started on IV heparin and CT head done without evidence of bleed.  Patient underwent cardiac cath revealing global hypokinesis with EF 40-45% and non obstructive CAD. Patient with hypotension with BP drop to 97/49 despite fluid bolus. Started on dopamine for stabilization.  He developed facial droop with dysarthria and worsening of left sided weakness. Neurology consulted and felt that patient likely has thrombotic infarct and workup initiated. Carotid dopplers done revealing 40-59% left distal ICA stenosis. For MRI/MRA brain today.    Assessment / Plan / Recommendation Clinical Impression  Patient presents with indication of a moderate-severe oropharyngeal dysphagia characterized by mod-severe oral weakness, suspected delayed swallow initiation, laryngeal weakness, and overt s/s of aspiration following intake of all consistencies provided. GIven severity of deficits,  will proceed with objective evaluation to determine potential to initiate a po diet with lowest aspiration risk.     Aspiration Risk  Severe    Diet Recommendation NPO   Medication Administration: Via alternative means    Other  Recommendations Recommended Consults: MBS Oral Care Recommendations: Oral care QID   Follow Up Recommendations  Inpatient Rehab    Frequency and Duration        Pertinent Vitals/Pain n/a     Swallow Study    General HPI: 51 y.o. male with history of HTN, Hep C, admitted on 06/28/12 pm with complaints of chest pressure and difficulty walking as well as left arm heaviness with tingling sensation . BP elevated at 178/118  EKG with ST changes and patient started on IV heparin and CT head done without evidence of bleed.  Patient underwent cardiac cath revealing global hypokinesis with EF 40-45% and non obstructive CAD. Patient with hypotension with BP drop to 97/49 despite fluid bolus. Started on dopamine for stabilization.  He developed facial droop with dysarthria and worsening of left sided weakness. Neurology consulted and felt that patient likely has thrombotic infarct and workup initiated. Carotid dopplers done revealing 40-59% left distal ICA stenosis. For MRI/MRA brain today.  Type of Study: Bedside swallow evaluation Diet Prior to this Study: NPO Temperature Spikes Noted: No Respiratory Status: Supplemental O2 delivered via (comment) (2 L nasal cannula) History of Recent Intubation: No Behavior/Cognition: Alert;Cooperative;Pleasant mood;Impulsive;Distractible;Decreased sustained attention Oral Cavity - Dentition: Poor condition;Missing dentition Self-Feeding Abilities: Able to feed self Patient Positioning: Upright in bed Baseline Vocal Quality: Clear Volitional Cough: Weak Volitional Swallow: Able to elicit    Oral/Motor/Sensory Function Overall Oral Motor/Sensory Function: Impaired Labial ROM: Reduced left Labial Symmetry: Abnormal symmetry  left Labial Strength: Reduced Labial Sensation: Within Functional Limits Lingual ROM: Within Functional Limits Lingual Symmetry: Abnormal symmetry left Lingual Strength: Reduced Lingual Sensation: Within Functional Limits Facial ROM: Reduced left Facial Symmetry: Left droop Facial Strength: Reduced Facial Sensation: Within Functional Limits Velum: Within Functional Limits Mandible: Within Functional Limits   Ice Chips Ice chips: Impaired Presentation: Spoon Oral Phase Impairments: Reduced labial seal Oral Phase Functional Implications: Left anterior spillage Pharyngeal Phase Impairments: Suspected delayed Swallow;Decreased hyoid-laryngeal movement;Throat Clearing - Immediate;Cough - Immediate   Thin Liquid Thin Liquid: Impaired Presentation: Cup Oral Phase Impairments: Reduced lingual movement/coordination Oral Phase Functional Implications: Left anterior spillage Pharyngeal  Phase Impairments: Suspected delayed Swallow;Decreased hyoid-laryngeal movement;Throat Clearing - Immediate;Cough - Immediate    Nectar Thick Nectar Thick Liquid: Not tested   Honey Thick Honey Thick Liquid: Not tested   Puree Puree: Impaired Presentation: Spoon Oral Phase Impairments: Impaired anterior to posterior transit Oral Phase Functional Implications: Prolonged oral transit Pharyngeal Phase Impairments: Decreased hyoid-laryngeal movement;Multiple swallows;Throat Clearing - Immediate   Solid   GO   Lasandra Batley MA, CCC-SLP [phone]  Solid: Not tested       Mahkai Fangman Meryl 06/30/2012,9:28 AM

## 2012-07-01 ENCOUNTER — Inpatient Hospital Stay (HOSPITAL_COMMUNITY)
Admission: RE | Admit: 2012-07-01 | Discharge: 2012-07-22 | DRG: 945 | Disposition: A | Discharge status: Home / Self Care | Payer: Medicaid - Out of State | Source: Ambulatory Visit | Attending: Physical Medicine & Rehabilitation | Admitting: Physical Medicine & Rehabilitation

## 2012-07-01 DIAGNOSIS — I6330 Cerebral infarction due to thrombosis of unspecified cerebral artery: Secondary | ICD-10-CM | POA: Diagnosis present

## 2012-07-01 DIAGNOSIS — I639 Cerebral infarction, unspecified: Secondary | ICD-10-CM | POA: Diagnosis present

## 2012-07-01 DIAGNOSIS — D72829 Elevated white blood cell count, unspecified: Secondary | ICD-10-CM | POA: Diagnosis present

## 2012-07-01 DIAGNOSIS — E785 Hyperlipidemia, unspecified: Secondary | ICD-10-CM | POA: Diagnosis present

## 2012-07-01 DIAGNOSIS — I213 ST elevation (STEMI) myocardial infarction of unspecified site: Secondary | ICD-10-CM | POA: Diagnosis present

## 2012-07-01 DIAGNOSIS — B1920 Unspecified viral hepatitis C without hepatic coma: Secondary | ICD-10-CM | POA: Diagnosis present

## 2012-07-01 DIAGNOSIS — Z5189 Encounter for other specified aftercare: Secondary | ICD-10-CM

## 2012-07-01 DIAGNOSIS — I10 Essential (primary) hypertension: Secondary | ICD-10-CM | POA: Diagnosis present

## 2012-07-01 DIAGNOSIS — G4733 Obstructive sleep apnea (adult) (pediatric): Secondary | ICD-10-CM | POA: Diagnosis present

## 2012-07-01 DIAGNOSIS — I428 Other cardiomyopathies: Secondary | ICD-10-CM | POA: Diagnosis present

## 2012-07-01 DIAGNOSIS — I2510 Atherosclerotic heart disease of native coronary artery without angina pectoris: Secondary | ICD-10-CM | POA: Diagnosis present

## 2012-07-01 DIAGNOSIS — I219 Acute myocardial infarction, unspecified: Secondary | ICD-10-CM | POA: Diagnosis present

## 2012-07-01 DIAGNOSIS — F17200 Nicotine dependence, unspecified, uncomplicated: Secondary | ICD-10-CM | POA: Diagnosis present

## 2012-07-01 LAB — URINALYSIS, ROUTINE W REFLEX MICROSCOPIC
Glucose, UA: 100 mg/dL — AB
Hgb urine dipstick: NEGATIVE
Ketones, ur: 15 mg/dL — AB
Protein, ur: NEGATIVE mg/dL

## 2012-07-01 LAB — CBC
HCT: 42.8 % (ref 39.0–52.0)
Hemoglobin: 15 g/dL (ref 13.0–17.0)
MCH: 28.8 pg (ref 26.0–34.0)
MCHC: 35 g/dL (ref 30.0–36.0)

## 2012-07-01 LAB — BASIC METABOLIC PANEL
BUN: 8 mg/dL (ref 6–23)
Calcium: 9.3 mg/dL (ref 8.4–10.5)
GFR calc non Af Amer: >90 mL/min (ref >90)
Glucose, Bld: 145 mg/dL — ABNORMAL HIGH (ref 70–99)

## 2012-07-01 LAB — GLUCOSE, CAPILLARY: Glucose-Capillary: 88 mg/dL (ref 70–99)

## 2012-07-01 MED ORDER — POTASSIUM CHLORIDE CRYS ER 20 MEQ PO TBCR
40.0000 meq | EXTENDED_RELEASE_TABLET | Freq: Once | ORAL | Status: AC
  Administered 2012-07-01: 40 meq via ORAL
  Filled 2012-07-01: qty 2

## 2012-07-01 MED ORDER — INSULIN ASPART 100 UNIT/ML ~~LOC~~ SOLN
0.0000 [IU] | Freq: Three times a day (TID) | SUBCUTANEOUS | Status: DC
  Administered 2012-07-01 – 2012-07-02 (×2): 2 [IU] via SUBCUTANEOUS
  Administered 2012-07-02: 3 [IU] via SUBCUTANEOUS
  Administered 2012-07-04: 2 [IU] via SUBCUTANEOUS
  Administered 2012-07-05 – 2012-07-06 (×2): 3 [IU] via SUBCUTANEOUS
  Administered 2012-07-07 – 2012-07-08 (×2): 2 [IU] via SUBCUTANEOUS
  Administered 2012-07-08: 1 [IU] via SUBCUTANEOUS
  Administered 2012-07-08 – 2012-07-11 (×3): 2 [IU] via SUBCUTANEOUS
  Administered 2012-07-12: 3 [IU] via SUBCUTANEOUS
  Administered 2012-07-13 – 2012-07-15 (×2): 2 [IU] via SUBCUTANEOUS
  Administered 2012-07-17: 3 [IU] via SUBCUTANEOUS
  Administered 2012-07-18: 2 [IU] via SUBCUTANEOUS
  Administered 2012-07-19: 3 [IU] via SUBCUTANEOUS
  Administered 2012-07-19 – 2012-07-20 (×2): 2 [IU] via SUBCUTANEOUS
  Administered 2012-07-20: 3 [IU] via SUBCUTANEOUS
  Administered 2012-07-21: 2 [IU] via SUBCUTANEOUS

## 2012-07-01 MED ORDER — ASPIRIN 81 MG PO CHEW: 81.0000 mg | CHEWABLE_TABLET | Freq: Every day | ORAL | Status: DC

## 2012-07-01 MED ORDER — ASPIRIN EC 325 MG PO TBEC
325.0000 mg | DELAYED_RELEASE_TABLET | Freq: Every day | ORAL | Status: DC
  Administered 2012-07-02 – 2012-07-22 (×21): 325 mg via ORAL
  Filled 2012-07-01 (×24): qty 1

## 2012-07-01 MED ORDER — ENOXAPARIN SODIUM 40 MG/0.4ML ~~LOC~~ SOLN
40.0000 mg | SUBCUTANEOUS | Status: DC
  Administered 2012-07-02 – 2012-07-22 (×21): 40 mg via SUBCUTANEOUS
  Filled 2012-07-01 (×21): qty 0.4

## 2012-07-01 MED ORDER — TRAMADOL HCL 50 MG PO TABS: 50.0000 mg | ORAL_TABLET | Freq: Four times a day (QID) | ORAL | Status: AC | PRN

## 2012-07-01 MED ORDER — CLONAZEPAM 0.5 MG PO TABS
0.5000 mg | ORAL_TABLET | Freq: Every day | ORAL | Status: DC
  Administered 2012-07-01 – 2012-07-02 (×2): 0.5 mg via ORAL
  Filled 2012-07-01 (×3): qty 1

## 2012-07-01 MED ORDER — ACETAMINOPHEN 325 MG PO TABS
650.0000 mg | ORAL_TABLET | ORAL | Status: DC | PRN
  Administered 2012-07-02 – 2012-07-17 (×7): 650 mg via ORAL
  Filled 2012-07-01 (×8): qty 2

## 2012-07-01 MED ORDER — HYDRALAZINE HCL 20 MG/ML IJ SOLN
20.0000 mg | Freq: Four times a day (QID) | INTRAMUSCULAR | Status: DC | PRN
  Administered 2012-07-01: 20 mg via INTRAVENOUS

## 2012-07-01 MED ORDER — PROCHLORPERAZINE 25 MG RE SUPP
12.5000 mg | Freq: Four times a day (QID) | RECTAL | Status: DC | PRN
  Filled 2012-07-01: qty 1

## 2012-07-01 MED ORDER — ENOXAPARIN SODIUM 40 MG/0.4ML ~~LOC~~ SOLN: 40.0000 mg | SUBCUTANEOUS | Status: DC

## 2012-07-01 MED ORDER — RESOURCE THICKENUP CLEAR PO POWD: ORAL | Status: DC

## 2012-07-01 MED ORDER — GUAIFENESIN-DM 100-10 MG/5ML PO SYRP: 5.0000 mL | ORAL_SOLUTION | Freq: Four times a day (QID) | ORAL | Status: DC | PRN

## 2012-07-01 MED ORDER — LISINOPRIL 10 MG PO TABS
10.0000 mg | ORAL_TABLET | Freq: Every day | ORAL | Status: DC
  Administered 2012-07-01: 10 mg via ORAL
  Filled 2012-07-01: qty 1

## 2012-07-01 MED ORDER — ASPIRIN 325 MG PO TABS: 325.0000 mg | ORAL_TABLET | Freq: Every day | ORAL | Status: DC

## 2012-07-01 MED ORDER — ALUM & MAG HYDROXIDE-SIMETH 200-200-20 MG/5ML PO SUSP: 30.0000 mL | ORAL | Status: DC | PRN

## 2012-07-01 MED ORDER — RESOURCE THICKENUP CLEAR PO POWD
ORAL | Status: DC | PRN
  Filled 2012-07-01 (×2): qty 125

## 2012-07-01 MED ORDER — ACETAMINOPHEN 325 MG PO TABS: 650.0000 mg | ORAL_TABLET | ORAL | Status: DC | PRN

## 2012-07-01 MED ORDER — BISACODYL 10 MG RE SUPP
10.0000 mg | Freq: Every day | RECTAL | Status: DC | PRN
  Filled 2012-07-01: qty 1

## 2012-07-01 MED ORDER — ATORVASTATIN CALCIUM 40 MG PO TABS: 40.0000 mg | ORAL_TABLET | Freq: Every day | ORAL | Status: DC

## 2012-07-01 MED ORDER — METHOCARBAMOL 500 MG PO TABS: 500.0000 mg | ORAL_TABLET | Freq: Four times a day (QID) | ORAL | Status: DC | PRN

## 2012-07-01 MED ORDER — CHLORHEXIDINE GLUCONATE 0.12 % MT SOLN
15.0000 mL | Freq: Two times a day (BID) | OROMUCOSAL | Status: DC
  Administered 2012-07-01 – 2012-07-21 (×29): 15 mL via OROMUCOSAL
  Filled 2012-07-01 (×44): qty 15

## 2012-07-01 MED ORDER — ATORVASTATIN CALCIUM 40 MG PO TABS
40.0000 mg | ORAL_TABLET | Freq: Every day | ORAL | Status: DC
  Administered 2012-07-01 – 2012-07-21 (×21): 40 mg via ORAL
  Filled 2012-07-01 (×22): qty 1

## 2012-07-01 MED ORDER — PROCHLORPERAZINE MALEATE 5 MG PO TABS
5.0000 mg | ORAL_TABLET | Freq: Four times a day (QID) | ORAL | Status: DC | PRN
  Filled 2012-07-01 (×3): qty 2

## 2012-07-01 MED ORDER — FLEET ENEMA 7-19 GM/118ML RE ENEM: 1.0000 | ENEMA | Freq: Once | RECTAL | Status: AC | PRN

## 2012-07-01 MED ORDER — CLONAZEPAM 0.5 MG PO TABS: 0.5000 mg | ORAL_TABLET | Freq: Every day | ORAL | Status: DC

## 2012-07-01 MED ORDER — TRAZODONE HCL 50 MG PO TABS
25.0000 mg | ORAL_TABLET | Freq: Every evening | ORAL | Status: DC | PRN
  Administered 2012-07-03 – 2012-07-21 (×6): 50 mg via ORAL
  Filled 2012-07-01 (×6): qty 1

## 2012-07-01 MED ORDER — INSULIN ASPART 100 UNIT/ML ~~LOC~~ SOLN: 0.0000 [IU] | Freq: Every day | SUBCUTANEOUS | Status: DC

## 2012-07-01 MED ORDER — ALPRAZOLAM 0.25 MG PO TABS
0.2500 mg | ORAL_TABLET | Freq: Three times a day (TID) | ORAL | Status: DC | PRN
  Administered 2012-07-02 – 2012-07-06 (×5): 0.25 mg via ORAL
  Filled 2012-07-01 (×5): qty 1

## 2012-07-01 MED ORDER — TRAMADOL HCL 50 MG PO TABS
50.0000 mg | ORAL_TABLET | Freq: Four times a day (QID) | ORAL | Status: DC | PRN
  Administered 2012-07-02 – 2012-07-09 (×7): 50 mg via ORAL
  Filled 2012-07-01 (×8): qty 1

## 2012-07-01 MED ORDER — LISINOPRIL 10 MG PO TABS: 10.0000 mg | ORAL_TABLET | Freq: Every day | ORAL | Status: DC

## 2012-07-01 MED ORDER — ALPRAZOLAM 0.25 MG PO TABS: 0.2500 mg | ORAL_TABLET | Freq: Three times a day (TID) | ORAL | Status: DC | PRN

## 2012-07-01 MED ORDER — HYDRALAZINE HCL 20 MG/ML IJ SOLN: 20.0000 mg | Freq: Four times a day (QID) | INTRAMUSCULAR | Status: DC | PRN

## 2012-07-01 MED ORDER — POLYETHYLENE GLYCOL 3350 17 G PO PACK
17.0000 g | PACK | Freq: Every day | ORAL | Status: DC | PRN
  Administered 2012-07-10 – 2012-07-16 (×4): 17 g via ORAL
  Filled 2012-07-01 (×3): qty 1

## 2012-07-01 MED ORDER — PROCHLORPERAZINE EDISYLATE 5 MG/ML IJ SOLN
5.0000 mg | Freq: Four times a day (QID) | INTRAMUSCULAR | Status: DC | PRN
  Filled 2012-07-01: qty 2

## 2012-07-01 MED ORDER — LISINOPRIL 10 MG PO TABS
10.0000 mg | ORAL_TABLET | Freq: Every day | ORAL | Status: DC
  Administered 2012-07-02: 10 mg via ORAL
  Filled 2012-07-01 (×2): qty 1

## 2012-07-01 MED ORDER — HYDRALAZINE HCL 20 MG/ML IJ SOLN
INTRAMUSCULAR | Status: AC
  Filled 2012-07-01: qty 1

## 2012-07-01 NOTE — Progress Notes (Signed)
Stroke Team Progress Note  HISTORY Glenn Maldonado is an 51 y.o. male with a history of hypertension, MI, DM, tobacco abuse, OSA presented 06/28/2012 to [facility] with ST elevations on EKG in the setting of left-sided paresthesias and weakness. He was transferred to [facility] emergently for cardiac catheterization. After catheterization which did not show any coronary artery blockage, was recommended that he had persistent left-sided weakness. He therefore got a head CT which is negative, and is scheduled for an MRI today.   Just before 4 AM 06/29/2012, he was able to converse without much dysarthria, then it was noticed that he was having significant dysarthria and therefore code stroke was called at 4:53.   Patient has also had some shortness of breath, and nausea and vomiting.  Patient was not a TPA candidate secondary to symptoms present since the day prior to consult (per history woke with symptoms Sunday morning; dx missed on arrival to [facility]).  SUBJECTIVE Patient currently working with therapy.  OBJECTIVE Most recent Vital Signs: Filed Vitals:   06/30/12 2100 07/01/12 0000 07/01/12 0400 07/01/12 0900  BP: 184/84 188/87 173/77 208/103  Pulse:      Temp:  98.5 F (36.9 C) 98.2 F (36.8 C)   TempSrc:   Oral   Resp: 17 15 23    Height:      Weight:      SpO2:  93% 95%    CBG (last 3)   Basename 06/30/12 0742 06/30/12 0341 06/29/12 2352  GLUCAP 132* 123* 122*   Intake/Output from previous day: 08/27 0701 - 08/28 0700 In: 2093 [P.O.:290; I.V.:1803] Out: 2900 [Urine:2900]  IV Fluid Intake:     . DISCONTD: sodium chloride 75 mL/hr at 06/28/12 1900  . DISCONTD: dextrose 5 % and 0.45% NaCl 75 mL/hr (06/30/12 2237)  . DISCONTD: DOPamine 2 mcg/kg/min (06/29/12 1333)   MEDICATIONS    . aspirin  81 mg Oral Daily  . atorvastatin  40 mg Oral q1800  . clonazePAM  0.5 mg Oral QHS  . enoxaparin (LOVENOX) injection  40 mg Subcutaneous Q24H  . hydrALAZINE      . lisinopril  10 mg Oral  Daily  . LORazepam  1 mg Intravenous On Call  . sodium chloride  3 mL Intravenous Q12H  . DISCONTD: aspirin  150 mg Rectal Daily   PRN:  sodium chloride, acetaminophen, ALPRAZolam, hydrALAZINE, morphine injection, ondansetron (ZOFRAN) IV, RESOURCE THICKENUP CLEAR, sodium chloride, traMADol  Diet:  Dysphagia 2 honey thick liquids Activity:  OOB to chair with assistance DVT Prophylaxis:  NONE currently  CLINICALLY SIGNIFICANT STUDIES Basic Metabolic Panel:   Lab 06/28/12 1443 06/28/12 1408  NA 140 137  K 3.3* 3.5  CL 101 99  CO2 -- 30  GLUCOSE 109* 109*  BUN 10 11  CREATININE 0.80 0.79  CALCIUM -- 9.1  MG -- 2.2  PHOS -- --   Liver Function Tests:   Lab 06/28/12 1408  AST 17  ALT 13  ALKPHOS 92  BILITOT 0.5  PROT 6.9  ALBUMIN 3.5   CBC:   Lab 06/28/12 1443 06/28/12 1408  WBC -- 12.9*  NEUTROABS -- 7.2  HGB 14.3 14.5  HCT 42.0 40.9  MCV -- 81.6  PLT -- 255   Coagulation:   Lab 06/28/12 1408  LABPROT 13.6  INR 1.02   Cardiac Enzymes:   Lab 06/30/12 1325 06/30/12 0600 06/30/12 0248  CKTOTAL 59 55 56  CKMB 3.8 3.6 3.5  CKMBINDEX -- -- --  TROPONINI <0.30 <0.30 <0.30  Urinalysis:   Lab 06/28/12 1831  COLORURINE YELLOW  LABSPEC >1.046*  PHURINE 7.0  GLUCOSEU NEGATIVE  HGBUR NEGATIVE  BILIRUBINUR NEGATIVE  KETONESUR NEGATIVE  PROTEINUR NEGATIVE  UROBILINOGEN 1.0  NITRITE NEGATIVE  LEUKOCYTESUR NEGATIVE   Lipid Panel    Component Value Date/Time   CHOL 142 06/30/2012 0248   HgbA1C  Lab Results  Component Value Date   HGBA1C 6.8* 06/29/2012    Urine Drug Screen:   No results found for this basename: labopia,  cocainscrnur,  labbenz,  amphetmu,  thcu,  labbarb    Alcohol Level: No results found for this basename: ETH:2 in the last 168 hours  CT of the brain   06/29/2012  White matter changes as above.  No definite evidence of acute intracranial abnormality. MRI has increased sensitivity for acute ischemia if clinical concern persists.      06/28/2012  No evidence for acute hemorrhage.  No evidence for a large infarct.  Subtle areas of low density in the white matter may represent chronic small vessel ischemic changes.   MRI of the brain  Acute infarcts are present bilaterally, involving the posterior limb internal capsule on the right in the parietal white matter bilaterally.  MRA of the brain    2D Echocardiogram  Left ventricle: The cavity size was normal. There was severe concentric hypertrophy. Systolic function was normal. The estimated ejection fraction was in the range of 50% to 55%. Lateral hypokinesis. Doppler parameters are consistent with abnormal left ventricular relaxation (grade 1 diastolic dysfunction). - Aortic valve: Sclerosis without stenosis. Trivial regurgitation. - Mitral valve: Mildly thickened leaflets . Trivial regurgitation. - Left atrium: Severelydilated (>40 ml/m2). - Right atrium: Moderately dilated. - Tricuspid valve: Trivial regurgitation. - Pulmonary arteries: PA peak pressure: 46mm Hg (S). - Systemic veins: The IVC measures >2.1 cm, but collapses >50%, suggesting an elevated RA pressure of 10 mmHg.  Carotid Doppler  No internal carotid artery stenosis bilaterally. Vertebrals with antegrade flow bilaterally. Stenosis noted in the left distal ICA in the 40-59% range by velocity, probably due to vessel tortuosity.   CXR  06/28/2012  No acute chest findings.   EKG  Marked sinus bradycardia. Possible Left atrial enlargement. Right bundle branch block. Left ventricular hypertrophy with repolarization abnormality. ST elevation consider anterior injury or acute infarct.   Therapy Recommendations PT - CIR ; OT - CIR ; ST - MBS  Physical Exam   Aged Caucasian male in distress do to severe nausea.Awake alert. Afebrile. Head is nontraumatic. Neck is supple without bruit. Hearing is normal. Cardiac exam no murmur or gallop. Lungs are clear to auscultation. Distal pulses are well felt.  Neurological Exam :  awake alert oriented to time place and person. No aphasia. Moderate dysarthria but can be understood.  Extraocular moments are full range with saccadic dysmetria with slight a right gaze preference. He blinks to threat bilaterally.  Moderate left lower facial weakness. Palate elevates normally. Cough and gag are mildly weak. Dense left hemiplegia with 1/5 strength only. Purposeful antigravity right-sided movements. Diminished sensation on the left. Left plantar is upgoing and right is downgoing. Gait was not tested. Moving L toes today.  ASSESSMENT Mr. Rodriquez Thorner is a 51 y.o. male presenting with left arm hemiparesis and ataxia in setting of acute MI. 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 Multiple tiny infarcts in left brain thought to be embolic sequelae of cardiac cath as no acute symptoms. Work up now complete. On no antiplatlets prior  to admission. Now on aspirin 81 mg orally every day for secondary stroke prevention. Patient with resultant left hemiparesis, nausea.  -HgbA1c 6.8  -hx hypertension, hypotension this am secondary to NTG drip and antihypertensives. 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 No MI. Most liekly related to HTN -NICM, EF- 50-55% -recent wt loss, non-intentional   Hospital day # 3  TREATMENT/PLAN -Increase aspirin to aspirin 325 mg orally every day for secondary stroke prevention. -agree with plans for SNF placement -Stroke Service will sign off. Follow up with Dr. Pearlean Brownie, Stroke Clinic, in 2 months.   Annie Main, MSN, RN, ANVP-BC, ANP-BC, [facility] Pager: [phone] 07/01/2012 10:23 AM  Scribe for Dr. Delia Heady, Stroke Center Medical Director, who has personally reviewed chart, pertinent data, examined the patient and developed the plan of care. Pager:  [phone]

## 2012-07-01 NOTE — Progress Notes (Signed)
Report called to RN on unit 4000 (Rehab). Pt transferred with RN and wife at the bedside.

## 2012-07-01 NOTE — Discharge Summary (Signed)
Patient ID: Glenn Maldonado,  MRN: [id number], DOB/AGE: 53-Sep-1962 53 y.o.  Admit date: 06/28/2012 Discharge date: 07/01/2012  Primary Care Provider:  Primary Cardiologist: Dr Herbie Baltimore  Discharge Diagnoses Principal Problem:  *CVA, Rt brain Active Problems:  STE on ECG - not MI; non-obstructive CAD on Cath, most likely related to HTN  HTN (hypertension), uncontrolled  Leg weakness, bilateral  NICM, EF 35% at cardiac cath, 55% by echo  Tobacco abuse  Weight loss, non-intentional  OSA (obstructive sleep apnea), history of, but with recent weight loss less symptoms    Procedures: Coronary angiogram 06/28/12   Hospital Course:  51 year old WMM, with no prior cardiac history, presented 06/28/12 from [facility] after developing chest pressure, and left arm heaviness. He was also having difficulty walking, "legs not wanting to go where he wanted then to". He went on to church and symptoms increased so he went to the hospital He was HTN with 178/118. He was started on ASA given IV NTG, IV lopressor 5 mg IV and 5,000 units of Heparin given. Heparin drip started. EKG showed ST elevation in ant Leads, V3 mostly. Pt was transported to [facility] emergently for cardiac cath. On arrival here he seemed to down play symptoms, but still had chest pressure, "like a house cat sitting on my chest" and lt. Arm heaviness. Ext. Still feel tingling. BP still elevated IV NTG increased. IV Hydralazine 10 mg given. Head Ct was negative for bleed. He had been iagnosed with HTN 2 years ago but has not been able to afford  to continue meds. Has had weight loss recently without trying, previously borderline diabetic. He was taken to the cath lab and this revealed normal coronaries with an EF of 35%. After his cath head c/o of Lt arm numbness, then at 4am he had slurred speech. A code stroke was called. His CT was repeated and showed no acute abnormality. MRI confirmed acute infarcts bilaterally, involving the posterior    limb internal capsule on the right in the parietal white matter  bilaterally. Echocardiogram showed an EF of 50-55% with severe LVH, grade 1 diastolic dysfunction, AOV sclerosis, and PA pk of . Neurology service suggest keeping systolic B/P greater than 120 and for a short period he was on Dopamine to keep his pressure up. He is felt to be stable for discharge to in-patient rehab today. Low dose ACE-I was added today but he may need further titration of his medications as an OP. He is on ASA 81mg .     Discharge Vitals:  Blood pressure 150/102, pulse 75, temperature 98.3 F (36.8 C), temperature source Oral, resp. rate 16, height 5\' 10"  (1.778 m), weight 82.2 kg (181 lb 3.5 oz), SpO2 96.00%.    Labs: Results for orders placed during the hospital encounter of 06/28/12 (from the past 48 hour(s))  GLUCOSE, CAPILLARY     Status: Normal   Collection Time   06/29/12  8:00 PM      Component Value Range Comment   Glucose-Capillary 98  70 - 99 mg/dL   GLUCOSE, CAPILLARY     Status: Abnormal   Collection Time   06/29/12 11:52 PM      Component Value Range Comment   Glucose-Capillary 122 (*) 70 - 99 mg/dL   CARDIAC PANEL(CRET KIN+CKTOT+MB+TROPI)     Status: Normal   Collection Time   06/30/12  2:48 AM      Component Value Range Comment   Total CK 56  7 - 232 U/L  CK, MB 3.5  0.3 - 4.0 ng/mL    Troponin I <0.30  <0.30 ng/mL    Relative Index RELATIVE INDEX IS INVALID  0.0 - 2.5   LIPID PANEL     Status: Abnormal   Collection Time   06/30/12  2:48 AM      Component Value Range Comment   Cholesterol 142  0 - 200 mg/dL    Triglycerides 92  <829 mg/dL    HDL 36 (*) >56 mg/dL    Total CHOL/HDL Ratio 3.9      VLDL 18  0 - 40 mg/dL    LDL Cholesterol 88  0 - 99 mg/dL   GLUCOSE, CAPILLARY     Status: Abnormal   Collection Time   06/30/12  3:41 AM      Component Value Range Comment   Glucose-Capillary 123 (*) 70 - 99 mg/dL   CARDIAC PANEL(CRET KIN+CKTOT+MB+TROPI)     Status: Normal    Collection Time   06/30/12  6:00 AM      Component Value Range Comment   Total CK 55  7 - 232 U/L    CK, MB 3.6  0.3 - 4.0 ng/mL    Troponin I <0.30  <0.30 ng/mL    Relative Index RELATIVE INDEX IS INVALID  0.0 - 2.5   GLUCOSE, CAPILLARY     Status: Abnormal   Collection Time   06/30/12  7:42 AM      Component Value Range Comment   Glucose-Capillary 132 (*) 70 - 99 mg/dL   CARDIAC PANEL(CRET KIN+CKTOT+MB+TROPI)     Status: Normal   Collection Time   06/30/12  1:25 PM      Component Value Range Comment   Total CK 59  7 - 232 U/L    CK, MB 3.8  0.3 - 4.0 ng/mL    Troponin I <0.30  <0.30 ng/mL    Relative Index RELATIVE INDEX IS INVALID  0.0 - 2.5   CBC     Status: Abnormal   Collection Time   07/01/12  9:40 AM      Component Value Range Comment   WBC 13.9 (*) 4.0 - 10.5 K/uL    RBC 5.20  4.22 - 5.81 MIL/uL    Hemoglobin 15.0  13.0 - 17.0 g/dL    HCT 21.3  08.6 - 57.8 %    MCV 82.3  78.0 - 100.0 fL    MCH 28.8  26.0 - 34.0 pg    MCHC 35.0  30.0 - 36.0 g/dL    RDW 46.9  62.9 - 52.8 %    Platelets 246  150 - 400 K/uL   BASIC METABOLIC PANEL     Status: Abnormal   Collection Time   07/01/12  9:40 AM      Component Value Range Comment   Sodium 141  135 - 145 mEq/L    Potassium 3.3 (*) 3.5 - 5.1 mEq/L    Chloride 103  96 - 112 mEq/L    CO2 27  19 - 32 mEq/L    Glucose, Bld 145 (*) 70 - 99 mg/dL    BUN 8  6 - 23 mg/dL    Creatinine, Ser 4.13  0.50 - 1.35 mg/dL    Calcium 9.3  8.4 - 24.4 mg/dL    GFR calc non Af Amer >90  >90 mL/min    GFR calc Af Amer >90  >90 mL/min     Disposition:  Follow-up Information    Follow  up with Marykay Lex, MD. (office will call)    Contact information:   [facility] [address] [place] [address] [phone]          Discharge Medications:  Medication List  As of 07/01/2012  1:15 PM   TAKE these medications         acetaminophen 325 MG tablet   Commonly known as: TYLENOL   Take  2 tablets (650 mg total) by mouth every 4 (four) hours as needed for pain or fever.      ALPRAZolam 0.25 MG tablet   Commonly known as: XANAX   Take 1 tablet (0.25 mg total) by mouth 3 (three) times daily as needed for anxiety.      aspirin 81 MG chewable tablet   Chew 1 tablet (81 mg total) by mouth daily.      atorvastatin 40 MG tablet   Commonly known as: LIPITOR   Take 1 tablet (40 mg total) by mouth daily at 6 PM.      clonazePAM 0.5 MG tablet   Commonly known as: KLONOPIN   Take 1 tablet (0.5 mg total) by mouth at bedtime.      enoxaparin 40 MG/0.4ML injection   Commonly known as: LOVENOX   Inject 0.4 mLs (40 mg total) into the skin daily.      hydrALAZINE 20 MG/ML injection   Commonly known as: APRESOLINE   Inject 1 mL (20 mg total) into the vein every 6 (six) hours as needed (SBP > 170 mmHg).      lisinopril 10 MG tablet   Commonly known as: PRINIVIL,ZESTRIL   Take 1 tablet (10 mg total) by mouth daily.      RESOURCE THICKENUP CLEAR Powd   As needed      traMADol 50 MG tablet   Commonly known as: ULTRAM   Take 1 tablet (50 mg total) by mouth every 6 (six) hours as needed.           Duration of Discharge Encounter: Greater than 30 minutes including physician time.  Jolene Provost PA-C 07/01/2012 1:15 PM  I saw & examined the patient along with Mr. Leron Croak, [place]. I agree with his findings, examination & recommendations.  Unfortunate situation with a patient presenting with "stuttering" neurologic symptoms -- CT Head pre-cath showed no acute process. Taken to cath lab due to Anterior STE on ECG - non-obstructive CAD with reduced EF & profound HTN & MRI/A of brain ordered to evaluate for Cerebrovascular disease involved with his presenting Sx -- not able to be done due to Saturday schedule. Was actually not having neurologic Sx during & post cath until later on that evening. Early AM - Code Stroke called. Repeat Head CT negative - but due to stuttering nature of Sx  (1st onset the previous AM), not TPA candidate.  BP meds added post-cath for HTN (bps in 200s) - but became bradycardic & hypotensive at onset of CVA -- NTG gtt stopped & Dopamine infused for short time - BPs now back in 170s-180s.  Results of MRI/A noted. Wife reports that the hope is transfer to In-Pt rehab as early as today.  Will add back ACE-I & d/c IVF (passed swallow study). IV Hydralazine if BP>180.  EF improved on Echo vs Cath -- ? CVA related stress cardiomyopathy vs. HTN related functional result.  From a cardiac standpoint, he is ok for d/c to inPatient Rehab - will need BP monitored to ensure adequate  control.  Marykay Lex, M.D., M.S. [facility] [address] [place], [place]  [address]  [phone] Pager # [phone]  07/01/2012 1:41 PM

## 2012-07-01 NOTE — PMR Pre-admission (Signed)
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 male with history of HTN, Hep C, admitted on 06/28/12 pm with complaints of chest pressure and difficulty walking as well as left arm heaviness with tingling sensation . BP elevated at 178/118 EKG with ST changes and patient started on IV heparin and CT head done without evidence of bleed. Patient underwent cardiac cath revealing global hypokinesis with EF 40-45% and non obstructive CAD. Patient with hypotension with BP drop to 97/49 despite fluid bolus. Started on dopamine for stabilization. He developed facial droop with dysarthria and worsening of left sided weakness. Neurology consulted and felt that patient likely has thrombotic infarct and workup initiated. Carotid dopplers done revealing 40-59% left distal ICA stenosis.  MRI 08/27 showed Acute infarct posterior limb internal capsule on the right. Small  areas of acute infarct in the parietal white matter bilaterally.  BP elevated to 208/103 this am and restarted on lisinopril.  BP trending down after meds given.  Total: 9 =NIH  Past Medical History  Past Medical History  Diagnosis Date  . Hypertension   . Borderline diabetic   . STEMI (ST elevation myocardial infarction) 06/28/2012  . HTN (hypertension), uncontrolled 06/28/2012  . Leg weakness, bilateral 06/28/2012  . Tobacco abuse 06/28/2012  .  Weight loss, non-intentional 06/28/2012  . OSA (obstructive sleep apnea), history of, but with recent weight loss less symptoms 06/28/2012  . Hepatitis C 2001    treated with interferon  . NICM (nonischemic cardiomyopathy), EF 35% at cardiac cath 06/28/2012    Family History  family history includes Coronary artery disease in his mother; HIV in his brother; and Peripheral vascular disease in his father.  Prior Rehab/Hospitalizations:  No previous rehab admissions.  Current Medications  Current facility-administered medications:0.9 %  sodium chloride infusion, 250 mL, Intravenous, PRN, Marykay Lex, MD;  acetaminophen (TYLENOL) tablet 650 mg, 650 mg, Oral, Q4H PRN, Marykay Lex, MD, 650 mg at 06/28/12 2003;  ALPRAZolam Prudy Feeler) tablet 0.25 mg, 0.25 mg, Oral, TID PRN, Abelino Derrick, PA;  aspirin chewable tablet 81 mg, 81 mg, Oral, Daily, Marykay Lex, MD, 81 mg at 07/01/12 0900 atorvastatin (LIPITOR) tablet 40 mg, 40 mg, Oral, q1800, Marykay Lex, MD, 40 mg at 06/30/12 1648;  clonazePAM (KLONOPIN) tablet 0.5 mg, 0.5 mg, Oral, QHS, Runell Gess, MD, 0.5 mg at 06/30/12 2126;  enoxaparin (LOVENOX) injection 40 mg, 40 mg, Subcutaneous, Q24H, Layne Benton, NP, 40 mg at 06/30/12 0955;  hydrALAZINE (APRESOLINE) 20 MG/ML injection, , , ,  hydrALAZINE (APRESOLINE) injection 20 mg, 20 mg, Intravenous, Q6H PRN, Marykay Lex, MD, 20 mg at 07/01/12 0859;  lisinopril (PRINIVIL,ZESTRIL) tablet 10 mg, 10 mg, Oral, Daily, Wilburt Finlay, PA, 10 mg at 07/01/12 0900;  morphine 2 MG/ML injection 2 mg, 2 mg, Intravenous, Q1H PRN, Marykay Lex, MD, 2 mg at 06/30/12 0113;  ondansetron (  ZOFRAN) injection 4 mg, 4 mg, Intravenous, Q6H PRN, Marykay Lex, MD, 4 mg at 06/29/12 1333 RESOURCE THICKENUP CLEAR, , Oral, PRN, Marykay Lex, MD;  sodium chloride 0.9 % injection 3 mL, 3 mL, Intravenous, Q12H, Marykay Lex, MD, 3 mL at 06/30/12 2127;  sodium chloride 0.9 % injection 3 mL, 3 mL, Intravenous, PRN,  Marykay Lex, MD;  traMADol Janean Sark) tablet 50 mg, 50 mg, Oral, Q6H PRN, Abelino Derrick, [place] DISCONTD: 0.9 %  sodium chloride infusion, , Intravenous, Continuous, Marykay Lex, MD, Last Rate: 75 mL/hr at 06/28/12 1900;  DISCONTD: aspirin suppository 150 mg, 150 mg, Rectal, Daily, Chrystie Nose, MD, 150 mg at 06/29/12 2216;  DISCONTD: dextrose 5 %-0.45 % sodium chloride infusion, , Intravenous, Continuous, Marykay Lex, MD, Last Rate: 75 mL/hr at 06/30/12 2237, 75 mL/hr at 06/30/12 2237 DISCONTD: DOPamine (INTROPIN) 800 mg in dextrose 5 % 250 mL infusion, 2-20 mcg/kg/min, Intravenous, Titrated, Layne Benton, NP, Last Rate: 3.1 mL/hr at 06/29/12 1333, 2 mcg/kg/min at 06/29/12 1333  Patients Current Diet: Dysphagia  Precautions / Restrictions Precautions Precautions: Fall Precaution Comments: hemiparesis on L, increased tone L LE Restrictions Weight Bearing Restrictions: No   Prior Activity Level Community (5-7x/wk): Went out daily.  Worked FT and went to church on Sundays.  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, historic buildings)  Current Functional Level Cognition  Arousal/Alertness: Awake/alert Overall Cognitive Status: Impaired Overall Cognitive Status: Impaired Current Attention Level: Focused Attention - Other Comments: pt has difficulty staying on task and is easily distracted Orientation Level: Oriented X4 Safety/Judgement: Decreased awareness of safety precautions;Decreased safety judgement for tasks assessed;Impulsive Safety/Judgement - Other Comments: pt is restless and "throws himself around" in bed and EOB, decreasing safety Cognition - Other Comments: still in a very frustrated state and mourning the loss of function he is experiencing,  also having a lost of anxiety with subsequent chest pressure, nsg aware and attentive.  Wife reports that he had issues with stress and anxiety even before this and that his episode is attributed to being "too stressed" Attention: Focused;Sustained;Selective Focused Attention: Appears intact Sustained Attention: Impaired Sustained Attention Impairment: Verbal complex;Functional complex Selective Attention: Impaired Selective Attention Impairment: Verbal complex;Functional complex;Verbal basic;Functional basic (distracted by environmental stimuli) Memory: Appears intact Awareness: Impaired Awareness Impairment: Intellectual impairment (however improved by end of session with verbal cueing) Problem Solving: Impaired Problem Solving Impairment: Verbal complex;Functional complex Executive Function: Reasoning;Decision Making Reasoning: Impaired Reasoning Impairment: Verbal complex;Functional complex Decision Making: Impaired Decision Making Impairment: Functional complex Behaviors: Impulsive;Poor frustration tolerance Safety/Judgment: Impaired Comments: decreased safety awareness, impulsive    Extremity Assessment (includes Sensation/Coordination)     RLE ROM/Strength/Tone: Within functional levels RLE Sensation: WFL - Light Touch RLE Coordination: WFL - gross/fine motor    ADLs  Upper Body Dressing: Performed;Maximal assistance Where Assessed - Upper Body Dressing: Unsupported sitting Lower Body Dressing: Performed;+1 Total assistance Where Assessed - Lower Body Dressing: Unsupported sitting Toilet Transfer: Simulated;+2 Total assistance Toilet Transfer: Patient Percentage: 20% Toilet Transfer Method: Stand pivot (toward R) Toileting - Clothing Manipulation and Hygiene: Performed;+2 Total assistance Toileting - Clothing Manipulation and Hygiene: Patient Percentage: 0% Where Assessed - Toileting Clothing Manipulation and Hygiene: Standing Equipment Used: Gait  belt Transfers/Ambulation Related to ADLs: Limirted ADL eval performed as pt in bed with eyes  closed.  OT eval focused on LUE and education regarding positioning and PROM with wife. Eyes closed 90 percent of OT eval. ADL Comments: Educated pt and wife on positioning of LUE in chair. Attempted AAROM with facilitation and gravity eliminated with LUE, pt unable to attend for participation.  L UE noted to move only with pt yawning.  Pt distracted by feeling that he could not breathe.  RN aware.  Appears to be anxiety.  Wife stated pt is anxious at baseline and is a smoker.    Mobility  Bed Mobility: Sitting - Scoot to Edge of Bed Supine to Sit: 3: Mod assist;With rails Sitting - Scoot to Edge of Bed: 4: Min assist;With rail Sit to Supine: 2: Max assist;HOB flat;With rail    Transfers  Transfers: Sit to Stand;Stand to Sit;Stand Pivot Transfers Sit to Stand: 1: +2 Total assist;From bed Sit to Stand: Patient Percentage: 40% Stand to Sit: 1: +2 Total assist;To chair/3-in-1 Stand to Sit: Patient Percentage: 30% Stand Pivot Transfers: 1: +2 Total assist Stand Pivot Transfers: Patient Percentage: 20%    Ambulation / Gait / Stairs / Wheelchair Mobility  Ambulation/Gait Ambulation/Gait Assistance: Not tested (comment) (unable) Stairs: No Wheelchair Mobility Wheelchair Mobility: No    Posture / Games developer Sitting - Balance Support: Right upper extremity supported;Feet supported Static Sitting - Level of Assistance: 5: Stand by assistance Static Sitting - Comment/# of Minutes: pt able to hold rail and sit with supervision, however, would fall left when let go of rail.   Static Standing Balance Static Standing - Balance Support: Bilateral upper extremity supported Static Standing - Level of Assistance: 1: +2 Total assist;Patient percentage (comment) (pt 20%)     Previous Home Environment Living Arrangements: Spouse/significant other Lives With: Spouse Available Help  at Discharge: Family Type of Home: House Home Care Services: No Additional Comments: needs to be further assessed.    Discharge Living Setting Plans for Discharge Living Setting: Patient's home;House;Lives with (comment) (Lives with wife.) Type of Home at Discharge: House Discharge Home Layout: One level;Laundry or work area in basement Discharge Home Access: Level entry Do you have any problems obtaining your medications?: No  Social/Family/Support Systems Patient Roles: Spouse;Parent Contact Information: Pascal Stiggers - wife (h) [phone] (c) [phone];  Iona Coach - mother-in-law (h) [phone] (c) [phone] Anticipated Caregiver: wife, mother-in-law and family Anticipated Caregiver's Contact Information: Has Dtr 41 and son 52 in school at [employer] Ability/Limitations of Caregiver: Wife worked FT with husband, but can assist. Caregiver Availability: 24/7 (Wife will have someone stay with patient all the time.) Discharge Plan Discussed with Primary Caregiver: Yes Is Caregiver In Agreement with Plan?: Yes Does Caregiver/Family have Issues with Lodging/Transportation while Pt is in Rehab?: No  Goals/Additional Needs Patient/Family Goal for Rehab: PT/OT min A, ST S goals Expected length of stay: 3 weeks Cultural Considerations: [employer] Dietary Needs: Dys 2, honey thick liquids, 2 Gm Na diet Equipment Needs: TBD Pt/Family Agrees to Admission and willing to participate: Yes Program Orientation Provided & Reviewed with Pt/Caregiver Including Roles  & Responsibilities: Yes  Patient Condition: This patient's condition remains as documented in the Consult dated 06/30/12, in which the Rehabilitation Physician determined and documented that the patient's condition is appropriate for intensive rehabilitative care in an inpatient rehabilitation facility.  Preadmission Screen Completed By:  Trish Mage, 07/01/2012 11:38  AM ______________________________________________________________________   Discussed status with Dr. Riley Kill on 07/01/12 at 1203 and received telephone approval for admission today.  Admission Coordinator:  Trish Mage, time1203/Date08/28/13

## 2012-07-01 NOTE — Progress Notes (Signed)
Patient admitted to 4031 at 1535. Patient alert and oriented but very anxious. Wife accompanied. L H-P and slurred speech noted. Patient and wife oriented to unit, rehab schedule, call bell system, and safety plan. Understanding verbalized. Glenn Maldonado

## 2012-07-01 NOTE — Progress Notes (Signed)
[facility]  Subjective: Sleeping when I entered.  Objective: Vital signs in last 24 hours: Temp:  [98.2 F (36.8 C)-98.7 F (37.1 C)] 98.2 F (36.8 C) (08/28 0400) Pulse Rate:  [75] 75  (08/27 0754) Resp:  [10-23] 23  (08/28 0400) BP: (169-201)/(73-106) 173/77 mmHg (08/28 0400) SpO2:  [93 %-96 %] 95 % (08/28 0400) Last BM Date:  (unknown)  Intake/Output from previous day: 08/27 0701 - 08/28 0700 In: 2093 [P.O.:290; I.V.:1803] Out: 2900 [Urine:2900] Intake/Output this shift:    Medications Current Facility-Administered Medications  Medication Dose Route Frequency Provider Last Rate Last Dose  . 0.9 %  sodium chloride infusion  250 mL Intravenous PRN Marykay Lex, MD      . acetaminophen (TYLENOL) tablet 650 mg  650 mg Oral Q4H PRN Marykay Lex, MD   650 mg at 06/28/12 2003  . ALPRAZolam Prudy Feeler) tablet 0.25 mg  0.25 mg Oral TID PRN Abelino Derrick, PA      . aspirin chewable tablet 81 mg  81 mg Oral Daily Marykay Lex, MD   81 mg at 06/30/12 1635  . atorvastatin (LIPITOR) tablet 40 mg  40 mg Oral q1800 Marykay Lex, MD   40 mg at 06/30/12 1648  . clonazePAM (KLONOPIN) tablet 0.5 mg  0.5 mg Oral QHS Runell Gess, MD   0.5 mg at 06/30/12 2126  . dextrose 5 %-0.45 % sodium chloride infusion   Intravenous Continuous Marykay Lex, MD 75 mL/hr at 06/30/12 2237 75 mL/hr at 06/30/12 2237  . enoxaparin (LOVENOX) injection 40 mg  40 mg Subcutaneous Q24H Layne Benton, NP   40 mg at 06/30/12 0955  . LORazepam (ATIVAN) injection 1 mg  1 mg Intravenous On Call Layne Benton, NP   1 mg at 06/30/12 0955  . morphine 2 MG/ML injection 2 mg  2 mg Intravenous Q1H PRN Marykay Lex, MD   2 mg at 06/30/12 0113  . ondansetron (ZOFRAN) injection 4 mg  4 mg Intravenous Q6H PRN Marykay Lex, MD   4 mg at 06/29/12 1333  . RESOURCE THICKENUP CLEAR   Oral PRN Marykay Lex, MD      . sodium chloride 0.9 % injection 3 mL  3 mL Intravenous Q12H Marykay Lex, MD   3 mL at 06/30/12 2127  . sodium chloride 0.9 % injection 3 mL  3 mL Intravenous PRN Marykay Lex, MD      . traMADol Janean Sark) tablet 50 mg  50 mg Oral Q6H PRN Eda Paschal [place], [place]      . DISCONTD: 0.9 %  sodium chloride infusion   Intravenous Continuous Marykay Lex, MD 75 mL/hr at 06/28/12 1900    . DISCONTD: aspirin suppository 150 mg  150 mg Rectal Daily Chrystie Nose, MD   150 mg at 06/29/12 2216  . DISCONTD: DOPamine (INTROPIN) 800 mg in dextrose 5 % 250 mL infusion  2-20 mcg/kg/min Intravenous Titrated Layne Benton, NP 3.1 mL/hr at 06/29/12 1333 2 mcg/kg/min at 06/29/12 1333    PE: General appearance: alert and cooperative Lungs: clear to auscultation bilaterally Heart: regular rate and rhythm, S1, S2 normal, no murmur, click, rub or gallop Extremities: No LEE Pulses: 2+ and symmetric Skin: warm and dry  Lab Results:   Basename 06/28/12 1443 06/28/12 1408  WBC -- 12.9*  HGB 14.3 14.5  HCT 42.0 40.9  PLT -- 255   BMET  Basename 06/28/12 1443 06/28/12 1408  NA 140 137  K 3.3* 3.5  CL 101 99  CO2 -- 30  GLUCOSE 109* 109*  BUN 10 11  CREATININE 0.80 0.79  CALCIUM -- 9.1   PT/INR  Basename 06/28/12 1408  LABPROT 13.6  INR 1.02   Cholesterol  Basename 06/30/12 0248  CHOL 142   Lipid Panel     Component Value Date/Time   CHOL 142 06/30/2012 0248   TRIG 92 06/30/2012 0248   HDL 36* 06/30/2012 0248   CHOLHDL 3.9 06/30/2012 0248   VLDL 18 06/30/2012 0248   LDLCALC 88 06/30/2012 0248   MRI HEAD WITHOUT CONTRAST  Technique: Multiplanar, multiecho pulse sequences of the brain and  surrounding structures were obtained according to standard protocol  without intravenous contrast.  Comparison: CT 06/29/2012  Findings: The patient was not able to complete the study. The  patient was moving and uncooperative. Sagittal T1 and axial  diffusion weighted imaging only were obtained and these are  degraded by motion.  Acute infarct posterior limb  internal capsule on the right. Small  areas of acute infarct in the parietal white matter bilaterally.  IMPRESSION:  Incomplete study, degraded by motion.  Acute infarcts are present bilaterally, involving the posterior  limb internal capsule on the right in the parietal white matter  bilaterally  2D Echo Study Conclusions  - Left ventricle: The cavity size was normal. There was severe concentric hypertrophy. Systolic function was normal. The estimated ejection fraction was in the range of 50% to 55%. Lateral hypokinesis. Doppler parameters are consistent with abnormal left ventricular relaxation (grade 1 diastolic dysfunction). - Aortic valve: Sclerosis without stenosis. Trivial regurgitation. - Mitral valve: Mildly thickened leaflets . Trivial regurgitation. - Left atrium: Severelydilated (>40 ml/m2). - Right atrium: Moderately dilated. - Tricuspid valve: Trivial regurgitation. - Pulmonary arteries: PA peak pressure: 46mm Hg (S). - Systemic veins: The IVC measures >2.1 cm, but collapses >50%, suggesting an elevated RA pressure of 10 mmHg.  Assessment/Plan  Principal Problem:  *CVA, possible, w/u progress Active Problems:  STE on ECG - not MI; non-obstructive CAD on Cath, most likely related to HTN  HTN (hypertension), uncontrolled  Leg weakness, bilateral  Tobacco abuse  Weight loss, non-intentional  OSA (obstructive sleep apnea), history of, but with recent weight loss less symptoms  NICM (nonischemic cardiomyopathy), EF 35% at cardiac cath  Hypotension, now on Dopamine per Neuro  Plan:  Per Neuro keep SBP > 120.  Acute CVA  posterior limb internal capsule on the right.  Labs pending.  Add lisinopril.  BP has been > 200.  EF 50-55 % by Echo Mod Pulm HTN.   LOS: 3 days    Glenn Maldonado, Glenn Maldonado 07/01/2012 7:43 AM  I saw & examined the patient along with Mr. Leron Croak, [place].  I agree with his findings, examination & recommendations.  Unfortunate situation with a patient  presenting with "stuttering" neurologic symptoms -- CT Head pre-cath showed no acute process.  Taken to cath lab due to Anterior STE on ECG - non-obstructive CAD with reduced EF & profound HTN & MRI/A of brain ordered to evaluate for Cerebrovascular disease involved with his presenting Sx -- not able to be done due to Saturday schedule.  Was actually not having neurologic Sx during & post cath until later on that evening.  Early AM - Code Stroke called.  Repeat Head CT negative - but due to stuttering nature of Sx (1st onset the previous AM), not TPA candidate. BP meds  added post-cath for HTN (bps in 200s) - but became bradycardic & hypotensive at onset of CVA -- NTG gtt stopped & Dopamine infused for short time - BPs now back in 170s-180s.  Results of MRI/A noted.  Wife reports that the hope is transfer to In-Pt rehab as early as today.  Will add back ACE-I & d/c IVF (passed swallow study).  IV Hydralazine if BP>180. EF improved on Echo vs Cath -- ? CVA related stress cardiomyopathy vs. HTN related functional result.  Marykay Lex, M.D., M.S. [facility] [address] [place], [place]  [address]  [phone] Pager # [phone]  07/01/2012 8:27 AM   Appreciate Stroke team management.

## 2012-07-01 NOTE — Progress Notes (Signed)
PT and OT are in to work with patient. BP is 208/103.  Dr. Herbie Baltimore is here and orders were obtained for hydralazine 20 iv which was given.  During this time pt complains of anxiety stating that he is unable to breathe.  Spo2 96% on room air. Lungs are essentially clear with a scattered rhonchi noted in the upper lobes.  O2 was applied for patients comfort and he was reassured.  After 15 minutes he was able to participate in the therapy session.

## 2012-07-01 NOTE — H&P (Signed)
Physical Medicine and Rehabilitation Admission H&P  Chief Complaint   Patient presents with   .  Code STEMI, left sided weakness and difficulty walking.   :  HPI: Glenn Maldonado is a 51 y.o. RH-male with history of HTN, Hep C, admitted on 06/28/12 pm with complaints of chest pressure and difficulty walking as well as left arm heaviness with tingling sensation . BP elevated at 178/118 EKG with ST changes and patient started on IV heparin and CT head done without evidence of bleed. Patient underwent cardiac cath revealing global hypokinesis with EF 40-45% and non obstructive CAD. Patient with hypotension with BP drop to 97/49 despite fluid bolus. Started on dopamine for stabilization. He developed facial droop with dysarthria and worsening of left sided weakness. Neurology consulted and felt that patient likely has thrombotic infarct and workup initiated. Carotid dopplers done revealing 40-59% left distal ICA stenosis. MRI brain done revealing acute infarcts posterior limb of right IC and parietal white matter bilaterally. Neurology recommends ASA for CVA prophylaxis of thrombotic/embolic CVA. Therapies initiated and CIR recommended for progression.  Review of Systems  Eyes: Positive for blurred vision.  Respiratory: Negative for shortness of breath.  Cardiovascular: Negative for chest pain and palpitations.  Gastrointestinal: Negative for heartburn.  Genitourinary: Positive for frequency (every hour at night).  Neurological: Positive for dizziness, speech change, focal weakness and headaches (chronic).  Psychiatric/Behavioral: Positive for depression. The patient is nervous/anxious and has insomnia.   Past Medical History   Diagnosis  Date   .  Hypertension    .  Borderline diabetic    .  STEMI (ST elevation myocardial infarction)  06/28/2012   .  HTN (hypertension), uncontrolled  06/28/2012   .  Leg weakness, bilateral  06/28/2012   .  Tobacco abuse  06/28/2012   .  Weight loss, non-intentional   06/28/2012   .  OSA (obstructive sleep apnea), history of, but with recent weight loss less symptoms  06/28/2012   .  Hepatitis C  2001     treated with interferon   .  NICM (nonischemic cardiomyopathy), EF 35% at cardiac cath  06/28/2012    History reviewed. No pertinent past surgical history.  Family History   Problem  Relation  Age of Onset   .  Coronary artery disease  Mother    .  Peripheral vascular disease  Father    .  HIV  Brother     Social History: Married. 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 Live in [place], [place]. He reports that he has been smoking 1 PPD for 30 years. He has never used smokeless tobacco. He reports that quit alcohol use 12 years ago when Dx with Hep C. He does not use illicit drugs. Wife supportive and can provide supervision past discharge.  Allergies   Allergen  Reactions   .  Penicillins      Unknown    Scheduled Meds:  .  aspirin  81 mg  Oral  Daily   .  atorvastatin  40 mg  Oral  q1800   .  clonazePAM  0.5 mg  Oral  QHS   .  enoxaparin (LOVENOX) injection  40 mg  Subcutaneous  Q24H   .  hydrALAZINE      .  lisinopril  10 mg  Oral  Daily   .  sodium chloride  3 mL  Intravenous  Q12H   .  DISCONTD: aspirin  150 mg  Rectal  Daily  Continuous Infusions:  .  DISCONTD: sodium chloride  75 mL/hr at 06/28/12 1900   .  DISCONTD: dextrose 5 % and 0.45% NaCl  75 mL/hr (06/30/12 2237)   .  DISCONTD: DOPamine  2 mcg/kg/min (06/29/12 1333)    PRN Meds:.sodium chloride, acetaminophen, ALPRAZolam, hydrALAZINE, morphine injection, ondansetron (ZOFRAN) IV, RESOURCE THICKENUP CLEAR, sodium chloride, traMADol  No prescriptions prior to admission    Home:  Home Living  Lives With: Spouse  Available Help at Discharge: Family  Type of Home: House  Additional Comments: needs to be further assessed.  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, historic buildings)  Functional Status:  Mobility:  Bed Mobility  Bed Mobility: Sitting - Scoot to [place] of Bed  Supine to Sit: 3: Mod assist;With rails  Sitting - Scoot to [place] of Bed: 4: Min assist;With rail  Sit to Supine: 2: Max assist;HOB flat;With rail  Transfers  Transfers: Sit to Stand;Stand to Sit;Stand Pivot Transfers  Sit to Stand: 1: +2 Total assist;From bed  Sit to Stand: Patient Percentage: 40%  Stand to Sit: 1: +2 Total assist;To chair/3-in-1  Stand to Sit: Patient Percentage: 30%  Stand Pivot Transfers: 1: +2 Total assist  Stand Pivot Transfers: Patient Percentage: 20%  Ambulation/Gait  Ambulation/Gait Assistance: Not tested (comment) (unable)  Stairs: No  Wheelchair Mobility  Wheelchair Mobility: No  ADL:  ADL  Upper Body Dressing: Performed;Maximal assistance  Where Assessed - Upper Body Dressing: Unsupported sitting  Lower Body Dressing: Performed;+1 Total assistance  Where Assessed - Lower Body Dressing: Unsupported sitting  Toilet Transfer: Simulated;+2 Total assistance  Toilet Transfer Method: Stand pivot (toward R)  Equipment Used: Gait belt  Transfers/Ambulation Related to ADLs: Limirted ADL eval performed as pt in bed with eyes closed. OT eval focused on LUE and education regarding positioning and PROM with wife. Eyes closed 90 percent of OT eval.  ADL Comments: Educated pt and wife on positioning of LUE in chair. Attempted AAROM with facilitation and gravity eliminated with LUE, pt unable to attend for participation. L UE noted to move only with pt yawning. Pt distracted by feeling that he could not breathe. RN aware. Appears to be anxiety. Wife stated pt is anxious at baseline and is a smoker.  Cognition:  Cognition  Overall Cognitive Status: Impaired  Arousal/Alertness: Awake/alert  Orientation Level: Oriented X4  Attention: Focused;Sustained;Selective  Focused Attention: Appears intact  Sustained Attention: Impaired  Sustained  Attention Impairment: Verbal complex;Functional complex  Selective Attention: Impaired  Selective Attention Impairment: Verbal complex;Functional complex;Verbal basic;Functional basic (distracted by environmental stimuli)  Memory: Appears intact  Awareness: Impaired  Awareness Impairment: Intellectual impairment (however improved by end of session with verbal cueing)  Problem Solving: Impaired  Problem Solving Impairment: Verbal complex;Functional complex  Executive Function: Reasoning;Decision Making  Reasoning: Impaired  Reasoning Impairment: Verbal complex;Functional complex  Decision Making: Impaired  Decision Making Impairment: Functional complex  Behaviors: Impulsive;Poor frustration tolerance  Safety/Judgment: Impaired  Comments: decreased safety awareness, impulsive  Cognition  Overall Cognitive Status: Impaired  Area of Impairment: Safety/judgement;Attention  Arousal/Alertness: Awake/alert  Orientation Level: Appears intact for tasks assessed  Behavior During Session: Restless  Current Attention Level: Focused  Attention - Other Comments: pt has difficulty staying on task and is easily distracted  Safety/Judgement: Decreased awareness of safety precautions;Decreased safety judgement for tasks assessed;Impulsive  Safety/Judgement - Other Comments: pt is restless and "throws himself  around" in bed and EOB, decreasing safety  Cognition - Other Comments: still in a very frustrated state and mourning the loss of function he is experiencing, also having a lost of anxiety with subsequent chest pressure, nsg aware and attentive. Wife reports that he had issues with stress and anxiety even before this and that his episode is attributed to being "too stressed"  Blood pressure 158/103, pulse 75, temperature 98.4 F (36.9 C), temperature source Oral, resp. rate 23, height 5\' 10"  (1.778 m), weight 82.2 kg (181 lb 3.5 oz), SpO2 98.00%.  Physical Exam  Nursing note and vitals reviewed.    Constitutional: He is oriented to person, place, and time. He appears well-developed and well-nourished.  Flushed appearing. A little restless, anxious at times HENT: poor dentition with multiple missing teeth Head: Normocephalic and atraumatic.  Eyes: Pupils are equal, round, and reactive to light.  Neck: Normal range of motion.  Cardiovascular: Normal rate and regular rhythm.  Pulmonary/Chest: Effort normal. He has rhonchi in the right lower field and the left lower field.  Abdominal: Soft. Bowel sounds are normal.  Musculoskeletal: He exhibits edema (min edema left hand).  Neurological: He is alert and oriented to person, place, and time.  Mild to moderate dysarthria. LUE is 1/5 pec major, deltoid, bicep, tricep. LLE grossly 2 to 3/5 with synergy patterns noted.  Emerging tone noted LUE/LLE, tr to 1+. Senses pain on the left leg more than the arm. Left inattention. Left central 7 and tongue deviation. No gross field cut, but distractable during exam although Not as distracted today but anxious with decreased awareness,. Needs occasional redirection.   Results for orders placed during the hospital encounter of 06/28/12 (from the past 48 hour(s))   CARDIAC PANEL(CRET KIN+CKTOT+MB+TROPI) Status: Normal    Collection Time    06/29/12 1:13 PM   Component  Value  Range  Comment    Total CK  66  7 - 232 U/L     CK, MB  3.4  0.3 - 4.0 ng/mL     Troponin I  <0.30  <0.30 ng/mL     Relative Index  RELATIVE INDEX IS INVALID  0.0 - 2.5    GLUCOSE, CAPILLARY Status: Normal    Collection Time    06/29/12 8:00 PM   Component  Value  Range  Comment    Glucose-Capillary  98  70 - 99 mg/dL    GLUCOSE, CAPILLARY Status: Abnormal    Collection Time    06/29/12 11:52 PM   Component  Value  Range  Comment    Glucose-Capillary  122 (*)  70 - 99 mg/dL    CARDIAC PANEL(CRET KIN+CKTOT+MB+TROPI) Status: Normal    Collection Time    06/30/12 2:48 AM   Component  Value  Range  Comment    Total CK  56  7 - 232  U/L     CK, MB  3.5  0.3 - 4.0 ng/mL     Troponin I  <0.30  <0.30 ng/mL     Relative Index  RELATIVE INDEX IS INVALID  0.0 - 2.5    LIPID PANEL Status: Abnormal    Collection Time    06/30/12 2:48 AM   Component  Value  Range  Comment    Cholesterol  142  0 - 200 mg/dL     Triglycerides  92  <150 mg/dL     HDL  36 (*)  >30 mg/dL     Total CHOL/HDL Ratio  3.9  VLDL  18  0 - 40 mg/dL     LDL Cholesterol  88  0 - 99 mg/dL    GLUCOSE, CAPILLARY Status: Abnormal    Collection Time    06/30/12 3:41 AM   Component  Value  Range  Comment    Glucose-Capillary  123 (*)  70 - 99 mg/dL    CARDIAC PANEL(CRET KIN+CKTOT+MB+TROPI) Status: Normal    Collection Time    06/30/12 6:00 AM   Component  Value  Range  Comment    Total CK  55  7 - 232 U/L     CK, MB  3.6  0.3 - 4.0 ng/mL     Troponin I  <0.30  <0.30 ng/mL     Relative Index  RELATIVE INDEX IS INVALID  0.0 - 2.5    GLUCOSE, CAPILLARY Status: Abnormal    Collection Time    06/30/12 7:42 AM   Component  Value  Range  Comment    Glucose-Capillary  132 (*)  70 - 99 mg/dL    CARDIAC PANEL(CRET KIN+CKTOT+MB+TROPI) Status: Normal    Collection Time    06/30/12 1:25 PM   Component  Value  Range  Comment    Total CK  59  7 - 232 U/L     CK, MB  3.8  0.3 - 4.0 ng/mL     Troponin I  <0.30  <0.30 ng/mL     Relative Index  RELATIVE INDEX IS INVALID  0.0 - 2.5    CBC Status: Abnormal    Collection Time    07/01/12 9:40 AM   Component  Value  Range  Comment    WBC  13.9 (*)  4.0 - 10.5 K/uL     RBC  5.20  4.22 - 5.81 MIL/uL     Hemoglobin  15.0  13.0 - 17.0 g/dL     HCT  16.1  09.6 - 04.5 %     MCV  82.3  78.0 - 100.0 fL     MCH  28.8  26.0 - 34.0 pg     MCHC  35.0  30.0 - 36.0 g/dL     RDW  40.9  81.1 - 91.4 %     Platelets  246  150 - 400 K/uL    BASIC METABOLIC PANEL Status: Abnormal    Collection Time    07/01/12 9:40 AM   Component  Value  Range  Comment    Sodium  141  135 - 145 mEq/L     Potassium  3.3 (*)  3.5 - 5.1 mEq/L      Chloride  103  96 - 112 mEq/L     CO2  27  19 - 32 mEq/L     Glucose, Bld  145 (*)  70 - 99 mg/dL     BUN  8  6 - 23 mg/dL     Creatinine, Ser  7.82  0.50 - 1.35 mg/dL     Calcium  9.3  8.4 - 10.5 mg/dL     GFR calc non Af Amer  >90  >90 mL/min     GFR calc Af Amer  >90  >90 mL/min     Mr Brain Wo Contrast  06/30/2012 *RADIOLOGY REPORT* Clinical Data: Stroke MRI HEAD WITHOUT CONTRAST Technique: Multiplanar, multiecho pulse sequences of the brain and surrounding structures were obtained according to standard protocol without intravenous contrast. Comparison: CT 06/29/2012 Findings: The patient was not able to complete the study. The patient  was moving and uncooperative. Sagittal T1 and axial diffusion weighted imaging only were obtained and these are degraded by motion. Acute infarct posterior limb internal capsule on the right. Small areas of acute infarct in the parietal white matter bilaterally. IMPRESSION: Incomplete study, degraded by motion. Acute infarcts are present bilaterally, involving the posterior limb internal capsule on the right in the parietal white matter bilaterally. Original Report Authenticated By: Camelia Phenes, M.D.  Dg Swallowing Func-no Report  06/30/2012 CLINICAL DATA: stroke FLUOROSCOPY FOR SWALLOWING FUNCTION STUDY: Fluoroscopy was provided for swallowing function study, which was administered by a speech pathologist. Final results and recommendations from this study are contained within the speech pathology report.   Post Admission Physician Evaluation:  1. 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 2. Patient is admitted to receive collaborative, interdisciplinary care between the physiatrist, rehab nursing staff, and therapy team. 3. Patient's level of medical complexity and substantial therapy needs in context of that medical necessity cannot be provided at a lesser intensity of care such as a SNF. 4. Patient has experienced  substantial functional loss from his/her baseline which was documented above under the "Functional History" and "Functional Status" headings. Judging by the patient's diagnosis, physical exam, and functional history, the patient has potential for functional progress which will result in measurable gains while on inpatient rehab. These gains will be of substantial and practical use upon discharge in facilitating mobility and self-care at the household level. 5. Physiatrist will provide 24 hour management of medical needs as well as oversight of the therapy plan/treatment and provide guidance as appropriate regarding the interaction of the two. 6. 24 hour rehab nursing will assist with bladder management, bowel management, safety, skin/wound care, disease management, medication administration, pain management and patient education and help integrate therapy concepts, techniques,education, etc. 7. PT will assess and treat for: fxnl mobility, NMR, visuospatial awareness, lower ext strength, adaptive equipment. Goals are: minimal assistance. 8. OT will assess and treat for: UES, ADL's, fxnl mobility, visuospatial awareness, adaptive equipment. Goals are: min to mod assist. 9. SLP will assess and treat for: cognition, communication, swallowing. Goals are: supervision to minimal assistance. 10. Case Management and Social Worker will assess and treat for psychological issues and discharge planning. 11. Team conference will be held weekly to assess progress toward goals and to determine barriers to discharge. 12. Patient will receive at least 3 hours of therapy per day at least 5 days per week. 13. ELOS: 3 weeks Prognosis: good Medical Problem List and Plan:  1. DVT Prophylaxis/Anticoagulation: Pharmaceutical: Lovenox  2. Pain Management: Prn ultram for headaches.  3. Mood: will need a lot of ego support as high levels of frustration noted. Continue Klonopin at bedtime. neuropsych assessment and recs would be  helpful as well. 4. Neuropsych: This patient is capable of making decisions on his/her own behalf.  5. STEMI/Non-obstructiveCAD: Treated medically. Ace initiated for BP control.  6. Non-ischemic CM: HTN v/s CVA related.  7. Borderline DM :Hgb A1C at 6.8. Continue CBG checks AC/HS with SSI. May need oral agent.  8. HTN: Will monitor with bid checks. Avoid hypotension to prevent hypoperfusion.  9. Dyslipidemia: continue lipitor.  10. Leucocytosis: likely reactive. Will check UA/UCS  Ivory Broad, MD

## 2012-07-01 NOTE — Progress Notes (Signed)
Occupational Therapy Treatment Patient Details Name: Glenn Maldonado MRN: [id number] DOB: 05/21/1961 Today's Date: 07/01/2012 Time: 4098-1191 OT Time Calculation (min): 28 min  OT Assessment / Plan / Recommendation Comments on Treatment Session  Pt agreeable to working with therapies. Limited by increased anxiety and poor attention interfering with participation in mobility and ADL.  OT will continue to follow.  D/C plan remains appropriate.    Follow Up Recommendations  Inpatient Rehab    Barriers to Discharge       Equipment Recommendations  3 in 1 bedside comode    Recommendations for Other Services Rehab consult  Frequency Min 2X/week   Plan Discharge plan remains appropriate    Precautions / Restrictions Precautions Precautions: Fall Precaution Comments: hemiparesis on L, increased tone L LE Restrictions Weight Bearing Restrictions: No   Pertinent Vitals/Pain     ADL  Upper Body Dressing: Performed;Maximal assistance Where Assessed - Upper Body Dressing: Unsupported sitting Lower Body Dressing: Performed;+1 Total assistance Where Assessed - Lower Body Dressing: Unsupported sitting Toilet Transfer: Simulated;+2 Total assistance Toilet Transfer: Patient Percentage: 20% Toilet Transfer Method: Stand pivot (toward R) Toileting - Clothing Manipulation and Hygiene: Performed;+2 Total assistance Toileting - Clothing Manipulation and Hygiene: Patient Percentage: 0% Where Assessed - Glass blower/designer Manipulation and Hygiene: Standing Equipment Used: Gait belt ADL Comments: Educated pt and wife on positioning of LUE in chair. Attempted AAROM with facilitation and gravity eliminated with LUE, pt unable to attend for participation.  L UE noted to move only with pt yawning.  Pt distracted by feeling that he could not breathe.  RN aware.  Appears to be anxiety.  Wife stated pt is anxious at baseline and is a smoker.    OT Diagnosis:    OT Problem List:   OT Treatment  Interventions:     OT Goals Acute Rehab OT Goals Potential to Achieve Goals: Good ADL Goals Pt Will Perform Eating: with set-up;Sitting, chair ADL Goal: Eating - Progress: Goal set today Pt Will Perform Grooming: with min assist;Sitting, edge of bed ADL Goal: Grooming - Progress: Goal set today Pt Will Perform Upper Body Dressing: with min assist;Sitting, bed ADL Goal: Upper Body Dressing - Progress: Goal set today Pt Will Transfer to Toilet: with mod assist;3-in-1;Stand pivot transfer ADL Goal: Toilet Transfer - Progress: Goal set today Miscellaneous OT Goals Miscellaneous OT Goal #1: Pt and wife will PROM, as well as proper positioning for LUE to maintain joint integrtiy, prevent swelling, and protect LUE. OT Goal: Miscellaneous Goal #1 - Progress: Progressing toward goals Miscellaneous OT Goal #2: Pt will further tolerate OT eval  OT Goal: Miscellaneous Goal #2 - Progress: Met Miscellaneous OT Goal #3: Pt will attend to activity for 5 minutes with minimal cues for redirection. OT Goal: Miscellaneous Goal #3 - Progress: Goal set today  Visit Information  Last OT Received On: 07/01/12 Assistance Needed: +2 PT/OT Co-Evaluation/Treatment: Yes    Subjective Data      Prior Functioning       Cognition  Overall Cognitive Status: Impaired Area of Impairment: Safety/judgement;Attention Arousal/Alertness: Awake/alert Orientation Level: Appears intact for tasks assessed Behavior During Session: Restless Current Attention Level: Focused Attention - Other Comments: pt has difficulty staying on task and is easily distracted Safety/Judgement: Decreased awareness of safety precautions;Decreased safety judgement for tasks assessed;Impulsive Safety/Judgement - Other Comments: pt is restless and "throws himself around" in bed and EOB, decreasing safety Cognition - Other Comments: still in a very frustrated state and mourning the loss of function he is  experiencing, also having a lost of  anxiety with subsequent chest pressure, nsg aware and attentive.  Wife reports that he had issues with stress and anxiety even before this and that his episode is attributed to being "too stressed"    Mobility  Shoulder Instructions Bed Mobility Bed Mobility: Sitting - Scoot to Edge of Bed Sitting - Scoot to Edge of Bed: 4: Min assist;With rail Details for Bed Mobility Assistance: pt got to EOB from head elevated position and was able to grab the footboard rail and get himself to the EOB with only min A and guarding for safety as pt tends to lean fwd sudddenly trying to "get air" Transfers Transfers: Sit to Stand;Stand to Sit Sit to Stand: 1: +2 Total assist;From bed Sit to Stand: Patient Percentage: 40% Stand to Sit: 1: +2 Total assist;To chair/3-in-1 Stand to Sit: Patient Percentage: 30% Details for Transfer Assistance: pt's right knee blocked to prevent buckling, hyperextension occurring as well.  Before standing, heel able to get all  the way to the floor though as soon as he stood, flexor tone kicked in and foot back into plantar flexed position, unable to get heel down in standing.  Pt was able to participate greatly due to his ability to push up through right arm and right leg and keep weight centered over right side.  Assistance at trunk to keep hips extended and pt unable to bear wt through left leg to step right during pivot.       Exercises     Balance Balance Balance Assessed: Yes Static Sitting Balance Static Sitting - Balance Support: Right upper extremity supported;Feet supported Static Sitting - Level of Assistance: 5: Stand by assistance Static Sitting - Comment/# of Minutes: pt able to hold rail and sit with supervision, however, would fall left when let go of rail.   Static Standing Balance Static Standing - Balance Support: Bilateral upper extremity supported Static Standing - Level of Assistance: 1: +2 Total assist;Patient percentage (comment)   End of Session OT -  End of Session Activity Tolerance: Patient tolerated treatment well Patient left: in chair;with call bell/phone within reach;with family/visitor present Nurse Communication: Mobility status  GO     Evern Bio 07/01/2012, 12:14 PM [phone]

## 2012-07-01 NOTE — Progress Notes (Signed)
Physical Therapy Treatment Patient Details Name: Glenn Maldonado MRN: [id number] DOB: Mar 25, 1961 Today's Date: 07/01/2012 Time: 0900-0930 PT Time Calculation (min): 30 min  PT Assessment / Plan / Recommendation Comments on Treatment Session  Pt excited to be able to get to a chair today, still struggling with a lot of anxiety and restlessness that is affecting his mobility.  Continues to be an excellent candidate for CIR. PT will continue to follow.    Follow Up Recommendations  Inpatient Rehab    Barriers to Discharge        Equipment Recommendations  3 in 1 bedside comode    Recommendations for Other Services Rehab consult  Frequency Min 4X/week   Plan Discharge plan remains appropriate;Frequency remains appropriate    Precautions / Restrictions Precautions Precautions: Fall Precaution Comments: hemiparesis on L, increased tone L LE Restrictions Weight Bearing Restrictions: No   Pertinent Vitals/Pain No c/o pain BP 158/ 103 after meds given O2 sats 98% on RA but subjectively, pt reports difficulty breathing    Mobility  Bed Mobility Bed Mobility: Sitting - Scoot to Edge of Bed Sitting - Scoot to [place] of Bed: 4: Min assist;With rail Details for Bed Mobility Assistance: pt got to EOB from head elevated position and was able to grab the footboard rail and get himself to the EOB with only min A and guarding for safety as pt tends to lean fwd sudddenly trying to "get air" Transfers Transfers: Sit to Stand;Stand to Sit;Stand Pivot Transfers Sit to Stand: 1: +2 Total assist;From bed Sit to Stand: Patient Percentage: 40% Stand to Sit: 1: +2 Total assist;To chair/3-in-1 Stand to Sit: Patient Percentage: 30% Stand Pivot Transfers: 1: +2 Total assist Stand Pivot Transfers: Patient Percentage: 20% Details for Transfer Assistance: pt's right knee blocked to prevent buckling, hyperextension occurring as well.  Before standing, heel able to get all  the way to the floor though as soon  as he stood, flexor tone kicked in and foot back into plantar flexed position, unable to get heel down in standing.  Pt was able to participate greatly due to his ability to push up through right arm and right leg and keep weight centered over right side.  Assistance at trunk to keep hips extended and pt unable to bear wt through left leg to step right during pivot. Ambulation/Gait Ambulation/Gait Assistance: Not tested (comment) (unable) Stairs: No Wheelchair Mobility Wheelchair Mobility: No    Exercises General Exercises - Lower Extremity Ankle Circles/Pumps: AROM;Both;10 reps;Seated;Supine Quad Sets: AROM;Left;10 reps;Seated Long Arc Quad: AROM;Left;10 reps;Seated   PT Diagnosis:    PT Problem List:   PT Treatment Interventions:     PT Goals Acute Rehab PT Goals PT Goal Formulation: With patient/family Time For Goal Achievement: 07/13/12 Potential to Achieve Goals: Good Pt will go Supine/Side to Sit: with modified independence PT Goal: Supine/Side to Sit - Progress: Progressing toward goal Pt will Sit at [facility] of Bed: with supervision;with unilateral upper extremity support PT Goal: Sit at Edge Of Bed - Progress: Progressing toward goal Pt will go Sit to Supine/Side: with modified independence;with rail PT Goal: Sit to Supine/Side - Progress: Progressing toward goal Pt will go Sit to Stand: with min assist;with upper extremity assist PT Goal: Sit to Stand - Progress: Progressing toward goal Pt will go Stand to Sit: with min assist PT Goal: Stand to Sit - Progress: Progressing toward goal Pt will Transfer Bed to Chair/Chair to Bed: with min assist PT Transfer Goal: Bed to Chair/Chair to Bed -  Progress: Progressing toward goal Pt will Ambulate: 1 - 15 feet;with +2 total assist;with rolling walker  Visit Information  Last PT Received On: 07/01/12 Assistance Needed: +2 PT/OT Co-Evaluation/Treatment: Yes    Subjective Data  Subjective: pt restless and wanting to eat Patient  Stated Goal: to be able to ride his [employer] again   Cognition  Overall Cognitive Status: Impaired Area of Impairment: Safety/judgement;Attention Arousal/Alertness: Awake/alert Orientation Level: Appears intact for tasks assessed Behavior During Session: Restless Current Attention Level: Focused Attention - Other Comments: pt has difficulty staying on task and is easily distracted Safety/Judgement: Decreased awareness of safety precautions;Decreased safety judgement for tasks assessed;Impulsive Safety/Judgement - Other Comments: pt is restless and "throws himself around" in bed and EOB, decreasing safety Cognition - Other Comments: still in a very frustrated state and mourning the loss of function he is experiencing, also having a lost of anxiety with subsequent chest pressure, nsg aware and attentive.  Wife reports that he had issues with stress and anxiety even before this and that his episode is attributed to being "too stressed"    Balance  Balance Balance Assessed: Yes Static Sitting Balance Static Sitting - Balance Support: Right upper extremity supported;Feet supported Static Sitting - Level of Assistance: 5: Stand by assistance Static Sitting - Comment/# of Minutes: pt able to hold rail and sit with supervision, however, would fall left when let go of rail.   Static Standing Balance Static Standing - Balance Support: Bilateral upper extremity supported Static Standing - Level of Assistance: 1: +2 Total assist;Patient percentage (comment) (pt 20%)  End of Session PT - End of Session Equipment Utilized During Treatment: Gait belt Activity Tolerance: Patient limited by fatigue;Patient tolerated treatment well Patient left: in chair;with call bell/phone within reach;with family/visitor present Nurse Communication: Mobility status   GP   Lyanne Co, PT  Acute Rehab Services  [phone]   Lyanne Co 07/01/2012, 10:40 AM

## 2012-07-01 NOTE — Progress Notes (Signed)
Rehab admissions - Evaluated for possible admission.  I spoke with wife and patient today.  Wife interested in inpatient rehab prior to home with her.  Wife can provide 24 hour supervision and assist.  Bed available and can admit to inpatient rehab today.  Call me for questions.  #[phone]

## 2012-07-01 NOTE — Progress Notes (Signed)
Speech Language Pathology Dysphagia Treatment Patient Details Name: Glenn Maldonado MRN: [id number] DOB: 08/29/1961 Today's Date: 07/01/2012 Time: 4098-1191 SLP Time Calculation (min): 16 min  Assessment / Plan / Recommendation Clinical Impression  Treatment focused on caregiver education to spouse as patient sleeping soundly at this time and staff preparing for transfer to CIR.  Noted thin, unthickened gingerale at bedside. Wife states patient had "a few sips to wash cake down." Educated wife on importance of thickener due to results of MBS and demostrated appropriate thickening of liquids. Wife verbalized understanding. Noted increased in rhonchi on RN shift assessment. Suspect that increased impulsivity, noted during initial exam and reinforced by RN during am meal, is contributing to increased aspiration risk at this time. Will need close f/u by SLP and staff during meals once transferred to CIR. Will f/u as long as patient on acute level.     Diet Recommendation  Continue with Current Diet: Dysphagia 2 (fine chop);Honey-thick liquid    SLP Plan Continue with current plan of care   Pertinent Vitals/Pain n/a   Swallowing Goals  SLP Swallowing Goals Patient will consume recommended diet without observed clinical signs of aspiration with: Moderate assistance Swallow Study Goal #1 - Progress: Not Met Patient will utilize recommended strategies during swallow to increase swallowing safety with: Moderate assistance Swallow Study Goal #2 - Progress: Not met  General Temperature Spikes Noted: No Respiratory Status: Room air Behavior/Cognition:  (sleeping soundly) Oral Cavity - Dentition: Poor condition;Missing dentition Patient Positioning: Partially reclined      Dysphagia Treatment Treatment focused on: Patient/family/caregiver education Family/Caregiver Educated: spouse Patient observed directly with PO's: No Reason PO's not observed: Other (comment) (sleeping soundly, preparing for  transfer to CIR) Feeding: Able to feed self   GO   Ferdinand Lango MA, CCC-SLP [phone]   Ferdinand Lango Meryl 07/01/2012, 2:55 PM

## 2012-07-02 ENCOUNTER — Inpatient Hospital Stay (HOSPITAL_COMMUNITY): Payer: Medicaid - Out of State | Admitting: Speech Pathology

## 2012-07-02 ENCOUNTER — Inpatient Hospital Stay (HOSPITAL_COMMUNITY): Payer: Medicaid - Out of State

## 2012-07-02 ENCOUNTER — Inpatient Hospital Stay (HOSPITAL_COMMUNITY): Payer: Medicaid - Out of State | Admitting: *Deleted

## 2012-07-02 DIAGNOSIS — I10 Essential (primary) hypertension: Secondary | ICD-10-CM

## 2012-07-02 DIAGNOSIS — I6330 Cerebral infarction due to thrombosis of unspecified cerebral artery: Secondary | ICD-10-CM

## 2012-07-02 DIAGNOSIS — Z5189 Encounter for other specified aftercare: Secondary | ICD-10-CM

## 2012-07-02 LAB — CBC WITH DIFFERENTIAL/PLATELET
Eosinophils Relative: 4 % (ref 0–5)
HCT: 43.9 % (ref 39.0–52.0)
Hemoglobin: 15.1 g/dL (ref 13.0–17.0)
Lymphocytes Relative: 29 % (ref 12–46)
Lymphs Abs: 3.2 10*3/uL (ref 0.7–4.0)
MCH: 28.7 pg (ref 26.0–34.0)
MCV: 83.3 fL (ref 78.0–100.0)
Monocytes Absolute: 1.2 10*3/uL — ABNORMAL HIGH (ref 0.1–1.0)
Monocytes Relative: 10 % (ref 3–12)
Platelets: 240 10*3/uL (ref 150–400)
RBC: 5.27 MIL/uL (ref 4.22–5.81)
WBC: 11.1 10*3/uL — ABNORMAL HIGH (ref 4.0–10.5)

## 2012-07-02 LAB — GLUCOSE, CAPILLARY
Glucose-Capillary: 109 mg/dL — ABNORMAL HIGH (ref 70–99)
Glucose-Capillary: 170 mg/dL — ABNORMAL HIGH (ref 70–99)
Glucose-Capillary: 155 mg/dL — ABNORMAL HIGH (ref 70–99)

## 2012-07-02 LAB — URINE CULTURE
Colony Count: NO GROWTH
Culture: NO GROWTH

## 2012-07-02 LAB — COMPREHENSIVE METABOLIC PANEL
ALT: 11 U/L (ref 0–53)
BUN: 14 mg/dL (ref 6–23)
CO2: 28 mEq/L (ref 19–32)
Calcium: 9.4 mg/dL (ref 8.4–10.5)
GFR calc Af Amer: >90 mL/min (ref >90)
GFR calc non Af Amer: >90 mL/min (ref >90)
Glucose, Bld: 121 mg/dL — ABNORMAL HIGH (ref 70–99)
Sodium: 142 mEq/L (ref 135–145)

## 2012-07-02 MED ORDER — LISINOPRIL 20 MG PO TABS
20.0000 mg | ORAL_TABLET | Freq: Every day | ORAL | Status: DC
  Administered 2012-07-03: 20 mg via ORAL
  Filled 2012-07-02 (×3): qty 1

## 2012-07-02 MED ORDER — LISINOPRIL 5 MG PO TABS
5.0000 mg | ORAL_TABLET | Freq: Every day | ORAL | Status: DC
  Administered 2012-07-02: 5 mg via ORAL
  Filled 2012-07-02 (×2): qty 1

## 2012-07-02 MED ORDER — NICOTINE 14 MG/24HR TD PT24
14.0000 mg | MEDICATED_PATCH | Freq: Every day | TRANSDERMAL | Status: DC
  Administered 2012-07-02: 14 mg via TRANSDERMAL
  Filled 2012-07-02 (×3): qty 1

## 2012-07-02 NOTE — Evaluation (Signed)
Occupational Therapy Assessment and Plan  Patient Details  Name: Glenn Maldonado MRN: [id number] Date of Birth: 10/17/1961  OT Diagnosis: hemiplegia affecting non-dominant side and muscle weakness (generalized) Rehab Potential: Rehab Potential: Good ELOS: 2-3 weeks   Today's Date: 07/02/2012 Time: 7846-9629 Time Calculation (min): 75 min  Problem List:  Patient Active Problem List  Diagnosis  . 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 HTN (hypertension), uncontrolled  . Leg weakness, bilateral  . Tobacco abuse  . Weight loss, non-intentional  . OSA (obstructive sleep apnea), history of, but with recent weight loss less symptoms  . NICM, EF 35% at cardiac cath, 55% by echo  . CVA (cerebral infarction)    Past Medical History:  Past Medical History  Diagnosis Date  . Hypertension   . Borderline diabetic   . STEMI (ST elevation myocardial infarction) 06/28/2012  . HTN (hypertension), uncontrolled 06/28/2012  . Leg weakness, bilateral 06/28/2012  . Tobacco abuse 06/28/2012  . Weight loss, non-intentional 06/28/2012  . OSA (obstructive sleep apnea), history of, but with recent weight loss less symptoms 06/28/2012  . Hepatitis C 2001    treated with interferon  . NICM (nonischemic cardiomyopathy), EF 35% at cardiac cath 06/28/2012   Past Surgical History: No past surgical history on file.  Assessment & Plan Clinical Impression: Patient is a 51 y.o. year old male withhistory of HTN, Hep C, admitted on 06/28/12 pm with complaints of chest pressure and difficulty walking as well as left arm heaviness with tingling sensation . BP elevated at 178/118 EKG with ST changes and patient started on IV heparin and CT head done without evidence of bleed. Patient underwent cardiac cath revealing global hypokinesis with EF 40-45% and non obstructive CAD. Patient with hypotension with BP drop to 97/49 despite fluid bolus. Started on dopamine for stabilization. He  developed facial droop with dysarthria and worsening of left sided weakness. Neurology consulted and felt that patient likely has thrombotic infarct and workup initiated. Carotid dopplers done revealing 40-59% left distal ICA stenosis. MRI brain done revealing acute infarcts posterior limb of right IC and parietal white matter bilaterally. Neurology recommends ASA for CVA prophylaxis of thrombotic/embolic CVA. Patient transferred to CIR on 07/01/2012 .    Patient currently requires Min Assist to Total Assist with basic self-care skills secondary to muscle weakness, unbalanced muscle activation, decreased coordination and decreased motor planning and decreased attention to left.  Prior to hospitalization, patient could complete ADL and IADL with independence.  Patient will benefit from skilled intervention to decrease level of assist with basic self-care skills prior to discharge home with care partner.  Anticipate patient will require 24 hour supervision and follow up home health.  OT - End of Session Activity Tolerance: Decreased this session Endurance Deficit: Yes OT Assessment Rehab Potential: Good OT Plan OT Frequency: 1-2 X/day, 60-90 minutes Estimated Length of Stay: 2-3 weeks OT Treatment/Interventions: Balance/vestibular training;Cognitive remediation/compensation;Discharge planning;DME/adaptive equipment instruction;Neuromuscular re-education;Self Care/advanced ADL retraining;Therapeutic Exercise;Therapeutic Activities;Functional mobility training;Functional electrical stimulation;Patient/family education;UE/LE Strength taining/ROM;UE/LE Coordination activities OT Recommendation Follow Up Recommendations: 24 hour supervision/assistance Equipment Recommended:  (TBD)  OT Evaluation Precautions/Restrictions  Precautions Precautions: Fall Restrictions Weight Bearing Restrictions: No Pain Pain Assessment Pain Assessment: No/denies pain Pain Score:   4 Pain Type: Acute pain Pain  Location: Arm Pain Orientation: Right Pain Descriptors: Sore Pain Frequency: Intermittent Pain Onset: With Activity Patients Stated Pain Goal: 3 Pain Intervention(s): RN made aware Home Living/Prior Functioning Home Living Lives With:  Spouse Available Help at Discharge: Family;Other (Comment) (2 children in college) Type of Home: House Home Access: Level entry Home Layout: One level Bathroom Shower/Tub: Tub/shower unit;Curtain Firefighter: Standard Bathroom Accessibility: Yes How Accessible: Accessible via walker Home Adaptive Equipment: Hand-held shower hose Additional Comments: wife's father had RW, w/c, canes  Prior Function Level of Independence: Needs assistance with gait;Independent with basic ADLs;Independent with transfers Able to Take Stairs?: Yes Driving: Yes 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) Leisure: Hobbies-yes (Comment) (riding motorcycle) ADL ADL Eating: Not assessed Grooming: Maximal assistance Where Assessed-Grooming: Sitting at sink Upper Body Bathing: Minimal assistance Where Assessed-Upper Body Bathing: Sitting at sink Lower Body Bathing: Maximal assistance Where Assessed-Lower Body Bathing: Sitting at sink;Standing at sink Upper Body Dressing: Moderate assistance Where Assessed-Upper Body Dressing: Sitting at sink Lower Body Dressing: Dependent Where Assessed-Lower Body Dressing: Standing at sink;Sitting at sink Toileting: Not assessed ADL Comments: Patient needed assist with static sitting balance, vc's for technique, patient became frustrated several times with performance during ADL. Vision/Perception  Vision - History Baseline Vision: Wears glasses all the time Patient Visual Report: No change from baseline  Cognition Overall Cognitive Status: Impaired Arousal/Alertness: Lethargic Orientation Level: Oriented X4 Focused Attention: Impaired Sustained Attention: Impaired Selective Attention: Impaired Memory: Appears  intact Awareness: Impaired Problem Solving: Impaired Reasoning: Impaired Decision Making: Impaired Behaviors: Impulsive;Restless;Poor frustration tolerance Safety/Judgment: Impaired Sensation Sensation Light Touch: Appears Intact Stereognosis: Impaired Detail Stereognosis Impaired Details: Impaired LUE Hot/Cold: Appears Intact Proprioception: Impaired Detail Proprioception Impaired Details: Impaired LUE Coordination Gross Motor Movements are Fluid and Coordinated: No Fine Motor Movements are Fluid and Coordinated: No Coordination and Movement Description: impaired Left UE fine and gross motor coordination Motor  Motor Motor: Hemiplegia Motor - Skilled Clinical Observations: strong extensor synergy; minimal movement out of synergy Mobility   Transfers Sit to Stand: 2: Max assist;From chair/3-in-1;With upper extremity assist Sit to Stand Details: Verbal cues for precautions/safety;Verbal cues for technique Stand to Sit: 2: Max assist;To chair/3-in-1;With upper extremity assist Stand to Sit Details (indicate cue type and reason): Verbal cues for sequencing;Verbal cues for technique;Verbal cues for precautions/safety  Trunk/Postural Assessment  Cervical Assessment Cervical Assessment: Within Functional Limits Thoracic Assessment Thoracic Assessment: Within Functional Limits Lumbar Assessment Lumbar Assessment: Within Functional Limits Postural Control Postural Control: Deficits on evaluation (limited righting reactions)  Balance Balance Balance Assessed: Yes Static Sitting Balance Static Sitting - Balance Support: Right upper extremity supported;Feet supported Static Sitting - Level of Assistance: 3: Mod assist Static Sitting - Comment/# of Minutes: pt sat on EOB x 5 minutes, occasionally pushing toward L with R hand Static Standing Balance Static Standing - Balance Support: Right upper extremity supported;During functional activity Static Standing - Level of Assistance:  2: Max assist Extremity/Trunk Assessment RUE Assessment RUE Assessment: Within Functional Limits (MMT: 4/5) LUE Assessment LUE Assessment: Exceptions to WFL LUE AROM (degrees) Overall AROM Left Upper Extremity: Other (comment);Deficits (slight shoulder elevation (1/5); 0/5 in all other ranges) LUE PROM (degrees) Overall PROM Left Upper Extremity: Within functional limits for tasks assessed  See FIM for current functional status Refer to Care Plan for Long Term Goals  Skilled Therapeutic Interventions/Progress Updates: ADL re-training session completed this AM at sink with wife present and assisting. Session with focus on hemi-dressing techniques, attention to task, direction following, sit-to-stands, static sitting balance, and attention to left side. Patient became frustrated several times with his performance during ADL. Therapist and wife provided encouragement for motivation frequently.  Recommendations for other services: None  Discharge Criteria: Patient will be  discharged from OT if patient refuses treatment 3 consecutive times without medical reason, if treatment goals not met, if there is a change in medical status, if patient makes no progress towards goals or if patient is discharged from hospital.  The above assessment, treatment plan, treatment alternatives and goals were discussed and mutually agreed upon: by patient and by family  Limmie Patricia, OTR/L 07/02/2012, 11:22 AM

## 2012-07-02 NOTE — Care Management Note (Signed)
[facility]patient Rehabilitation Center Individual Statement of Services  Patient Name:  Glenn Maldonado  Date:  07/02/2012  Welcome to the Inpatient Rehabilitation Center.  Our goal is to provide you with an individualized program based on your diagnosis and situation, designed to meet your specific needs.  With this comprehensive rehabilitation program, you will be expected to participate in at least 3 hours of rehabilitation therapies Monday-Friday, with modified therapy programming on the weekends.  Your rehabilitation program will include the following services:  Physical Therapy (PT), Occupational Therapy (OT), Speech Therapy (ST), 24 hour per day rehabilitation nursing, Therapeutic Recreaction (TR), Neuropsychology, Case Management (RN and Social Worker), Rehabilitation Medicine, Nutrition Services and Pharmacy Services  Weekly team conferences will be held on Wednesday to discuss your progress.  Your RN Case Designer, television/film set will talk with you frequently to get your input and to update you on team discussions.  Team conferences with you and your family in attendance may also be held.  Expected length of stay: 3 weeks Overall anticipated outcome: Min Assist  Depending on your progress and recovery, your program may change.  Your RN Case Estate agent will coordinate services and will keep you informed of any changes.  Your RN Sports coach and SW names and contact numbers are listed  below.  The following services may also be recommended but are not provided by the Inpatient Rehabilitation Center:   Driving Evaluations  Home Health Rehabiltiation Services  Outpatient [facility]  Vocational Rehabilitation   Arrangements will be made to provide these services after discharge if needed.  Arrangements include referral to agencies that provide these services.  Your insurance has been verified to be:  Pending medicaid Your primary doctor is:  None  Pertinent  information will be shared with your doctor and your insurance company.    Social Worker:  Dossie Der, [place] [phone]  Information discussed with and copy given to patient by: Lucy Chris, 07/02/2012, 1:55 PM

## 2012-07-02 NOTE — Progress Notes (Signed)
Patient information reviewed and entered into [facility] system by Aviva Wolfer, RN, CRRN, PPS Coordinator.  Information including medical coding and functional independence measure will be reviewed and updated through discharge.    

## 2012-07-02 NOTE — Evaluation (Signed)
Speech Language Pathology Assessment and Plan  Patient Details  Name: Glenn Maldonado MRN: [id number] Date of Birth: 1961/05/11  SLP Diagnosis: Dysarthria;Cognitive Impairments;Dysphagia  Rehab Potential: Good ELOS: 3 weeks   Today's Date: 07/02/2012 Time: 1400-1500 Time Calculation (min): 60 min  Problem List:  Patient Active Problem List  Diagnosis  . 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 HTN (hypertension), uncontrolled  . Leg weakness, bilateral  . Tobacco abuse  . Weight loss, non-intentional  . OSA (obstructive sleep apnea), history of, but with recent weight loss less symptoms  . NICM, EF 35% at cardiac cath, 55% by echo  . CVA (cerebral infarction)   Past Medical History:  Past Medical History  Diagnosis Date  . Hypertension   . Borderline diabetic   . STEMI (ST elevation myocardial infarction) 06/28/2012  . HTN (hypertension), uncontrolled 06/28/2012  . Leg weakness, bilateral 06/28/2012  . Tobacco abuse 06/28/2012  . Weight loss, non-intentional 06/28/2012  . OSA (obstructive sleep apnea), history of, but with recent weight loss less symptoms 06/28/2012  . Hepatitis C 2001    treated with interferon  . NICM (nonischemic cardiomyopathy), EF 35% at cardiac cath 06/28/2012   Past Surgical History: No past surgical history on file.  Assessment / Plan / Recommendation Clinical Impression  Glenn Maldonado is a 51 y.o. RH-male with history of HTN, Hep C, admitted on 06/28/12 pm with complaints of chest pressure and difficulty walking as well as left arm heaviness with tingling sensation. BP elevated at 178/118 EKG with ST changes and patient started on IV heparin and CT head done without evidence of bleed. Patient with hypotension with BP drop to 97/49 despite fluid bolus; started on dopamine for stabilization. He developed facial droop with dysarthria and worsening of left sided weakness. Neurology consulted and felt that patient likely  has thrombotic infarct and workup initiated. Carotid dopplers done revealing 40-59% left distal ICA stenosis. MRI brain done revealing acute infarcts posterior limb of right IC and parietal white matter bilaterally. Neurology recommends ASA for CVA prophylaxis of thrombotic/embolic CVA. Patient completed MBSS 06/30/12 revealing moderate-severe oral and pharyngeal based dysphagia with recommendation for Dys.2 and Honey-thick liquids diet with full supervision.  Patient transferred to [facility] 07/01/12 and upon evaluation today presents with moderate cognitive deficits, moderate-severe dysphagia and moderate dysarthria due to respiration deficits.  As a result, this patient would benefit from skilled SLP services to address above deficits, reduce burden of care upon discharge and maximize safe/functional independence with basic ADLs.    SLP Assessment  Patient will need skilled Speech Lanaguage Pathology Services during CIR admission    Recommendations  Follow up Recommendations: Outpatient SLP;24 hour supervision/assistance Equipment Recommended: None recommended by SLP    SLP Frequency 1-2 X/day, 30-60 minutes;5 out of 7 days   SLP Treatment/Interventions Cognitive remediation/compensation;Cueing hierarchy;Dysphagia/aspiration precaution training;Environmental controls;Functional tasks;Internal/external aids;Oral motor exercises;Patient/family education;Speech/Language facilitation;Therapeutic Activities;Therapeutic Exercise;Other (comment) (group treatments)    Pain Pain Assessment Pain Assessment: No/denies pain Pain Score: Asleep Prior Functioning Cognitive/Linguistic Baseline: Within functional limits Type of Home: House Lives With: Spouse Available Help at Discharge: Family (2 children in college) 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 SLP Short Term Goal 3 (Week 1): Patient will perform oral motor and diaphragmatic breahting exercises with mod assist verbal and visual cues. SLP Short Term Goal 4 (Week 1): Patient will utilize speech intelligibility strategies during conversation with min assist verbal cues  SLP Short Term Goal 5 (Week 1): Patient will request help as needed during completion of basic ADLs with min assist verbal/question cues.  See FIM for current functional status Refer to Care Plan for Long Term Goals  Recommendations for other services: None  Discharge Criteria: Patient will be discharged from SLP if patient refuses treatment 3 consecutive times without medical reason, if treatment goals not met, if there is a change in medical status, if patient makes no progress towards goals or if patient is discharged from hospital.  The above assessment, treatment plan, treatment alternatives and goals were discussed and mutually agreed upon: by patient and by family  Charlane Ferretti., CCC-SLP [phone]  Glenn Maldonado 07/02/2012, 5:10 PM

## 2012-07-02 NOTE — Evaluation (Signed)
Recreational Therapy Assessment and Plan  Patient Details  Name: Joshau Code MRN: [id number] Date of Birth: June 28, 1961 Today's Date: 07/02/2012  Rehab Potential: Good ELOS: 2-3 weeks  Assessment Clinical Impression: Problem List:  Patient Active Problem List   Diagnosis   .  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  HTN (hypertension), uncontrolled   .  Leg weakness, bilateral   .  Tobacco abuse   .  Weight loss, non-intentional   .  OSA (obstructive sleep apnea), history of, but with recent weight loss less symptoms   .  NICM, EF 35% at cardiac cath, 55% by echo   .  CVA (cerebral infarction)    Past Medical History:  Past Medical History   Diagnosis  Date   .  Hypertension    .  Borderline diabetic    .  STEMI (ST elevation myocardial infarction)  06/28/2012   .  HTN (hypertension), uncontrolled  06/28/2012   .  Leg weakness, bilateral  06/28/2012   .  Tobacco abuse  06/28/2012   .  Weight loss, non-intentional  06/28/2012   .  OSA (obstructive sleep apnea), history of, but with recent weight loss less symptoms  06/28/2012   .  Hepatitis C  2001     treated with interferon   .  NICM (nonischemic cardiomyopathy), EF 35% at cardiac cath  06/28/2012    Past Surgical History: No past surgical history on file.  Assessment & Plan  Clinical Impression: Ann Groeneveld is a 51 y.o. RH-male with history of HTN, Hep C, admitted on 06/28/12 pm with complaints of chest pressure and difficulty walking as well as left arm heaviness with tingling sensation . BP elevated at 178/118 EKG with ST changes and patient started on IV heparin and CT head done without evidence of bleed. Patient underwent cardiac cath revealing global hypokinesis with EF 40-45% and non obstructive CAD. Patient with hypotension with BP drop to 97/49 despite fluid bolus. Started on dopamine for stabilization. He developed facial droop with dysarthria and worsening of left sided weakness.  Neurology consulted and felt that patient likely has thrombotic infarct and workup initiated. Carotid dopplers done revealing 40-59% left distal ICA stenosis. MRI brain done revealing acute infarcts posterior limb of right IC and parietal white matter bilaterally. Patient transferred to CIR on 07/01/2012.   Patient presents with decreased activity tolerance, decreased functional mobility, decreased balance, left sided weakness, decreased cognition, left inattention, dysarthria, dysphagia limiting pt's independence with leisure/community pursuit.  ,Leisure History/Participation Premorbid leisure interest/current participation: Sports Designer, jewellery;[employer];Harold Barban care;Nature - Vegetable gardening;Nature - Fishing;Nature - Flower gardening;Crafts - Woodworking;Community - Travel (Comment);Community - Journalist, newspaper - Warden/ranger - Cards (carpentry, built houses) Expression Interests: Music (Comment);Dance (rock n roll, country, john boy and Therapist, nutritional) Other Leisure Interests: Television;Cooking/Baking Leisure Participation Style: With Family/Friends Awareness of Community Resources: Good-identify 3 post discharge leisure resources Psychosocial / Spiritual Patient agreeable to Pet Therapy: Yes Does patient have pets?: Yes (2 dogs, 1 cat) Social interaction - Mood/Behavior: Cooperative Film/video editor for Education?: Yes Recreational Therapy Orientation Orientation -Reviewed with patient: Available activity resources Strengths/Weaknesses Patient Strengths/Abilities: Willingness to participate;Active premorbidly Patient weaknesses: Physical limitations  Plan Min 1 time per week >20 minutes  Recommendations for other services: None  Discharge Criteria: Patient will be discharged from TR if patient refuses treatment 3 consecutive times without medical reason.  If treatment goals not met, if there  is a change in medical status, if patient makes no progress  towards goals or if patient is discharged from hospital.  The above assessment, treatment plan, treatment alternatives and goals were discussed and mutually agreed upon: by patient  Elyon Zoll 07/02/2012, 4:23 PM

## 2012-07-02 NOTE — Progress Notes (Signed)
Patient ID: Glenn Maldonado, male   DOB: [birth year], 51 y.o.   MRN: [id number]  Subjective/Complaints:  Tired but no c/os  Objective: Vital Signs: Blood pressure 167/83, pulse 66, temperature 97.8 F (36.6 C), temperature source Oral, resp. rate 18, height 5\' 10"  (1.778 m), weight 80 kg (176 lb 5.9 oz), SpO2 97.00%. Mr Brain Wo Contrast  06/30/2012  *RADIOLOGY REPORT*  Clinical Data: Stroke  MRI HEAD WITHOUT CONTRAST  Technique:  Multiplanar, multiecho pulse sequences of the brain and surrounding structures were obtained according to standard protocol without intravenous contrast.  Comparison: CT 06/29/2012  Findings: The patient was not able to complete the study.  The patient was moving and uncooperative.  Sagittal T1 and axial diffusion weighted imaging only were obtained and  these are degraded by motion.  Acute infarct posterior limb internal capsule on the right.  Small areas of acute infarct in the parietal white matter bilaterally.  IMPRESSION: Incomplete study, degraded by motion.  Acute infarcts are present bilaterally, involving the posterior limb internal capsule on the right in the parietal white matter bilaterally.   Original Report Authenticated By: Camelia Phenes, M.D.    Dg Swallowing Func-no Report  06/30/2012  CLINICAL DATA: stroke   FLUOROSCOPY FOR SWALLOWING FUNCTION STUDY:  Fluoroscopy was provided for swallowing function study, which was  administered by a speech pathologist.  Final results and recommendations  from this study are contained within the speech pathology report.     Results for orders placed during the hospital encounter of 07/01/12 (from the past 72 hour(s))  URINALYSIS, ROUTINE W REFLEX MICROSCOPIC     Status: Abnormal   Collection Time   07/01/12  4:23 PM      Component Value Range Comment   Color, Urine YELLOW  YELLOW    APPearance CLEAR  CLEAR    Specific Gravity, Urine 1.013  1.005 - 1.030    pH 8.5 (*) 5.0 - 8.0    Glucose, UA 100 (*) NEGATIVE mg/dL      Hgb urine dipstick NEGATIVE  NEGATIVE    Bilirubin Urine NEGATIVE  NEGATIVE    Ketones, ur 15 (*) NEGATIVE mg/dL    Protein, ur NEGATIVE  NEGATIVE mg/dL    Urobilinogen, UA 2.0 (*) 0.0 - 1.0 mg/dL    Nitrite NEGATIVE  NEGATIVE    Leukocytes, UA NEGATIVE  NEGATIVE MICROSCOPIC NOT DONE ON URINES WITH NEGATIVE PROTEIN, BLOOD, LEUKOCYTES, NITRITE, OR GLUCOSE <1000 mg/dL.  GLUCOSE, CAPILLARY     Status: Abnormal   Collection Time   07/01/12  4:37 PM      Component Value Range Comment   Glucose-Capillary 134 (*) 70 - 99 mg/dL    Comment 1 Notify RN     GLUCOSE, CAPILLARY     Status: Normal   Collection Time   07/01/12 10:08 PM      Component Value Range Comment   Glucose-Capillary 88  70 - 99 mg/dL    Comment 1 Notify RN     CBC WITH DIFFERENTIAL     Status: Abnormal   Collection Time   07/02/12  7:20 AM      Component Value Range Comment   WBC 11.1 (*) 4.0 - 10.5 K/uL    RBC 5.27  4.22 - 5.81 MIL/uL    Hemoglobin 15.1  13.0 - 17.0 g/dL    HCT 40.9  81.1 - 91.4 %    MCV 83.3  78.0 - 100.0 fL    MCH 28.7  26.0 - 34.0 pg  MCHC 34.4  30.0 - 36.0 g/dL    RDW 16.1  09.6 - 04.5 %    Platelets 240  150 - 400 K/uL    Neutrophils Relative 56  43 - 77 %    Neutro Abs 6.2  1.7 - 7.7 K/uL    Lymphocytes Relative 29  12 - 46 %    Lymphs Abs 3.2  0.7 - 4.0 K/uL    Monocytes Relative 10  3 - 12 %    Monocytes Absolute 1.2 (*) 0.1 - 1.0 K/uL    Eosinophils Relative 4  0 - 5 %    Eosinophils Absolute 0.4  0.0 - 0.7 K/uL    Basophils Relative 1  0 - 1 %    Basophils Absolute 0.1  0.0 - 0.1 K/uL   GLUCOSE, CAPILLARY     Status: Abnormal   Collection Time   07/02/12  7:44 AM      Component Value Range Comment   Glucose-Capillary 109 (*) 70 - 99 mg/dL    Comment 1 Notify RN        HEENT: poor dentition Cardio: RRR Resp: CTA B/L GI: BS positive Extremity:  Edema mild L dorsum hand Skin:   Intact Neuro: Lethargic, Cranial Nerve Abnormalities R central 7, Abnormal Motor 1/5 L FF,2/5 L  deltoid, biceps and triceps, Abnormal FMC Ataxic/ dec FMC and Tone  increased extensor tone in LUE, Tone:  Hypertonia and Dysarthric Musc/Skel:  Normal   Assessment/Plan: 1. 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 Physiatrist is providing close team supervision and 24 hour management of active medical problems listed below. Physiatrist and rehab team continue to assess barriers to discharge/monitor patient progress toward functional and medical goals. FIM:             FIM - Bed/Chair Transfer Bed/Chair Transfer: 1: Two helpers     Comprehension Comprehension Mode: Auditory Comprehension: 4-Understands basic 75 - 89% of the time/requires cueing 10 - 24% of the time  Expression Expression Mode: Verbal Expression: 4-Expresses basic 75 - 89% of the time/requires cueing 10 - 24% of the time. Needs helper to occlude trach/needs to repeat words.  Social Interaction Social Interaction: 4-Interacts appropriately 75 - 89% of the time - Needs redirection for appropriate language or to initiate interaction.  Problem Solving Problem Solving: 4-Solves basic 75 - 89% of the time/requires cueing 10 - 24% of the time  Memory Memory: 4-Recognizes or recalls 75 - 89% of the time/requires cueing 10 - 24% of the time  Medical Problem List and Plan:  1. DVT Prophylaxis/Anticoagulation: Pharmaceutical: Lovenox  2. Pain Management: Prn ultram for headaches.  3. Mood: will need a lot of ego support as high levels of frustration noted. Continue Klonopin at bedtime. neuropsych assessment and recs would be helpful as well.  4. Neuropsych: This patient is capable of making decisions on his/her own behalf.  5. STEMI/Non-obstructiveCAD: Treated medically. Ace initiated for BP control.  6. Non-ischemic CM: HTN v/s CVA related.  7. Borderline DM :Hgb A1C at 6.8. Continue CBG  checks AC/HS with SSI. May need oral agent.  8. HTN: Will monitor with bid checks. Avoid hypotension to prevent hypoperfusion.  9. Dyslipidemia: continue lipitor.  10. Leucocytosis: likely reactive. Will check UA/UCS    LOS (Days) 1 A FACE TO FACE EVALUATION WAS PERFORMED  Barnie Sopko E 07/02/2012, 7:55 AM

## 2012-07-02 NOTE — Evaluation (Signed)
Physical Therapy Assessment and Plan  Patient Details  Name: Glenn Maldonado MRN: [id number] Date of Birth: November 06, 1960  PT Diagnosis: Difficulty walking, Dizziness and giddiness, Hemiplegia non-dominant, Hypertonia and Muscle weakness Rehab Potential: Good ELOS: 3 weeks     Today's Date: 07/02/2012 Time: 0802-0902 Time Calculation (min): 60 min  Problem List:  Patient Active Problem List  Diagnosis  . 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 HTN (hypertension), uncontrolled  . Leg weakness, bilateral  . Tobacco abuse  . Weight loss, non-intentional  . OSA (obstructive sleep apnea), history of, but with recent weight loss less symptoms  . NICM, EF 35% at cardiac cath, 55% by echo  . CVA (cerebral infarction)    Past Medical History:  Past Medical History  Diagnosis Date  . Hypertension   . Borderline diabetic   . STEMI (ST elevation myocardial infarction) 06/28/2012  . HTN (hypertension), uncontrolled 06/28/2012  . Leg weakness, bilateral 06/28/2012  . Tobacco abuse 06/28/2012  . Weight loss, non-intentional 06/28/2012  . OSA (obstructive sleep apnea), history of, but with recent weight loss less symptoms 06/28/2012  . Hepatitis C 2001    treated with interferon  . NICM (nonischemic cardiomyopathy), EF 35% at cardiac cath 06/28/2012   Past Surgical History: No past surgical history on file.  Assessment & Plan Clinical Impression: Glenn Maldonado is a 51 y.o. RH-male with history of HTN, Hep C, admitted on 06/28/12 pm with complaints of chest pressure and difficulty walking as well as left arm heaviness with tingling sensation . BP elevated at 178/118 EKG with ST changes and patient started on IV heparin and CT head done without evidence of bleed. Patient underwent cardiac cath revealing global hypokinesis with EF 40-45% and non obstructive CAD. Patient with hypotension with BP drop to 97/49 despite fluid bolus. Started on dopamine for  stabilization. He developed facial droop with dysarthria and worsening of left sided weakness. Neurology consulted and felt that patient likely has thrombotic infarct and workup initiated. Carotid dopplers done revealing 40-59% left distal ICA stenosis. MRI brain done revealing acute infarcts posterior limb of right IC and parietal white matter bilaterally.  Patient transferred to CIR on 07/01/2012 .   Patient currently requires max with mobility secondary to decreased cardiorespiratoy endurance and impaired timing and sequencing, abnormal tone, unbalanced muscle activation, decreased coordination and decreased motor planning.  Prior to hospitalization, patient was independent with mobility and lived with Spouse in a House home.  Home access is  Level entry (threshold); threshold to enter. 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  Patient will benefit from skilled PT intervention to maximize safe functional mobility, minimize fall risk and decrease caregiver burden for planned discharge home with 24 hour assist.  Anticipate patient will benefit from follow up [facility] at discharge.  PT - End of Session Activity Tolerance: Tolerates < 10 min activity with changes in vital signs Endurance Deficit: Yes PT Assessment Rehab Potential: Good PT Plan PT Frequency: 1-2 X/day, 60-90 minutes;5 out of 7 days PT Treatment/Interventions: Ambulation/gait training;Balance/vestibular training;Functional electrical stimulation;DME/adaptive equipment instruction;Discharge planning;Functional mobility training;Neuromuscular re-education;Patient/family education;Therapeutic Exercise;Therapeutic Activities;Stair training;Splinting/orthotics;UE/LE Strength taining/ROM;UE/LE Coordination activities;Wheelchair propulsion/positioning PT Recommendation Follow Up Recommendations: Home health PT Equipment Recommended: Wheelchair (measurements);Wheelchair cushion (measurements);Rolling walker with 5"  wheels  PT Evaluation Precautions/Restrictions Precautions Precautions: Fall Precaution Comments: hemiplegia LLE, hypertonus LLE Restrictions Weight Bearing Restrictions: No  Vital Signs Therapy Vitals BP:  150/110 mmHg  Pain Pain Assessment Pain  Assessment: No/denies pain Pain Score: Asleep Home Living/Prior Functioning Home Living Lives With: Spouse Available Help at Discharge: Family (2 children in college) Type of Home: House Home Access: Level entry (threshold) Home Layout: One level Additional Comments: wife's father had RW, w/c, canes Prior Function Level of Independence: Independent with gait Able to Take Stairs?: Yes Driving: Yes 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) Leisure:  (riding motorcycle) Vision/Perception     Cognition Arousal/Alertness: Lethargic Orientation Level: Oriented X4 Sensation Sensation Light Touch: Appears Intact Proprioception: Appears Intact Motor  Motor Motor - Skilled Clinical Observations: strong extensor synergy; minimal movement out of synergy  Mobility Bed Mobility Bed Mobility: Rolling Right;Rolling Left;Left Sidelying to Sit Rolling Right: 2: Max assist Rolling Left: 5: Supervision Left Sidelying to Sit: 2: Max assist Transfers Sit to Stand: 2: Max assist Stand Pivot Transfers: 2: Max assist Locomotion  Ambulation Ambulation: No Stairs / Additional Locomotion Stairs: No Wheelchair Mobility Wheelchair Mobility: No  Trunk/Postural Assessment  Cervical Assessment Cervical Assessment: Within Film/video editor Assessment: Within Functional Limits Lumbar Assessment Lumbar Assessment: Within Functional Limits Postural Control Postural Control: Deficits on evaluation (limited righting reactions)  Balance Static Sitting Balance Static Sitting - Balance Support: Feet supported;Right upper extremity supported Static Sitting - Level of Assistance: 3: Mod assist Extremity  Assessment      RLE Assessment RLE Assessment: Within Functional Limits LLE Assessment LLE Assessment: Exceptions to [facility] (hip  and knee flex/ ext 2-/5; ankle PF 3/5; DF 1/5)  See FIM for current functional status Refer to Care Plan for Long Term Goals  Recommendations for other services: None  Discharge Criteria: Patient will be discharged from PT if patient refuses treatment 3 consecutive times without medical reason, if treatment goals not met, if there is a change in medical status, if patient makes no progress towards goals or if patient is discharged from hospital.  The above assessment, treatment plan, treatment alternatives and goals were discussed and mutually agreed upon: by patient and by family  Treatment today:  Static sitting balance on EOB with mod assist, x 5 minutes.  R lateral leans returning to midline, x 3.  Pt c/o dizziness, some nausea.  After transfer to recliner, pt's LEs elevated, back reclined, cold washcloth applied to head.  Pt felt this helped, but was still nauseated.  Gait not attempted due to dizzinesss.  RN informed.    Davetta Olliff 07/02/2012, 4:17 PM

## 2012-07-03 ENCOUNTER — Inpatient Hospital Stay (HOSPITAL_COMMUNITY): Payer: Medicaid - Out of State

## 2012-07-03 ENCOUNTER — Inpatient Hospital Stay (HOSPITAL_COMMUNITY): Payer: Medicaid - Out of State | Admitting: Speech Pathology

## 2012-07-03 LAB — BASIC METABOLIC PANEL
CO2: 29 mEq/L (ref 19–32)
CO2: 26 mEq/L (ref 19–32)
Calcium: 9.5 mg/dL (ref 8.4–10.5)
Chloride: 103 mEq/L (ref 96–112)
Creatinine, Ser: 0.94 mg/dL (ref 0.50–1.35)
GFR calc Af Amer: >90 mL/min (ref >90)
Glucose, Bld: 128 mg/dL — ABNORMAL HIGH (ref 70–99)
Potassium: 3.7 mEq/L (ref 3.5–5.1)
Sodium: 137 mEq/L (ref 135–145)

## 2012-07-03 LAB — PRO B NATRIURETIC PEPTIDE: Pro B Natriuretic peptide (BNP): 1445 pg/mL — ABNORMAL HIGH (ref 0–125)

## 2012-07-03 LAB — GLUCOSE, CAPILLARY
Glucose-Capillary: 118 mg/dL — ABNORMAL HIGH (ref 70–99)
Glucose-Capillary: 101 mg/dL — ABNORMAL HIGH (ref 70–99)

## 2012-07-03 LAB — EXPECTORATED SPUTUM ASSESSMENT W GRAM STAIN, RFLX TO RESP C

## 2012-07-03 LAB — D-DIMER, QUANTITATIVE: D-Dimer, Quant: 0.66 ug/mL-FEU — ABNORMAL HIGH (ref 0.00–0.48)

## 2012-07-03 LAB — CK TOTAL AND CKMB (NOT AT ARMC)
CK, MB: 2.5 ng/mL (ref 0.3–4.0)
Total CK: 69 U/L (ref 7–232)

## 2012-07-03 MED ORDER — SODIUM CHLORIDE 0.45 % IV SOLN
INTRAVENOUS | Status: DC
  Administered 2012-07-03: 10:00:00 via INTRAVENOUS

## 2012-07-03 MED ORDER — BACLOFEN 5 MG HALF TABLET
5.0000 mg | ORAL_TABLET | Freq: Three times a day (TID) | ORAL | Status: DC | PRN
  Filled 2012-07-03: qty 1

## 2012-07-03 MED ORDER — SODIUM CHLORIDE 0.45 % IV SOLN: INTRAVENOUS | Status: DC

## 2012-07-03 MED ORDER — LOSARTAN POTASSIUM 50 MG PO TABS
100.0000 mg | ORAL_TABLET | Freq: Once | ORAL | Status: AC
  Administered 2012-07-03: 100 mg via ORAL
  Filled 2012-07-03: qty 2

## 2012-07-03 MED ORDER — FUROSEMIDE 10 MG/ML IJ SOLN
40.0000 mg | Freq: Once | INTRAMUSCULAR | Status: AC
  Administered 2012-07-04: 40 mg via INTRAVENOUS
  Filled 2012-07-03: qty 4

## 2012-07-03 MED ORDER — LOSARTAN POTASSIUM 50 MG PO TABS
100.0000 mg | ORAL_TABLET | Freq: Every day | ORAL | Status: DC
  Administered 2012-07-04 – 2012-07-08 (×5): 100 mg via ORAL
  Filled 2012-07-03 (×8): qty 2

## 2012-07-03 MED ORDER — BACLOFEN 5 MG HALF TABLET
5.0000 mg | ORAL_TABLET | Freq: Every day | ORAL | Status: DC
  Administered 2012-07-03 – 2012-07-06 (×3): 5 mg via ORAL
  Filled 2012-07-03 (×5): qty 1

## 2012-07-03 MED ORDER — NICOTINE 7 MG/24HR TD PT24
7.0000 mg | MEDICATED_PATCH | Freq: Every day | TRANSDERMAL | Status: DC
  Filled 2012-07-03: qty 1

## 2012-07-03 MED ORDER — BACLOFEN 5 MG HALF TABLET
5.0000 mg | ORAL_TABLET | Freq: Two times a day (BID) | ORAL | Status: DC | PRN
  Administered 2012-07-04 – 2012-07-21 (×10): 5 mg via ORAL
  Filled 2012-07-03 (×5): qty 1

## 2012-07-03 MED ORDER — ALBUTEROL SULFATE (5 MG/ML) 0.5% IN NEBU
2.5000 mg | INHALATION_SOLUTION | Freq: Four times a day (QID) | RESPIRATORY_TRACT | Status: DC
  Administered 2012-07-03: 2.5 mg via RESPIRATORY_TRACT
  Filled 2012-07-03: qty 0.5

## 2012-07-03 MED ORDER — ONDANSETRON HCL 4 MG/2ML IJ SOLN: 4.0000 mg | Freq: Three times a day (TID) | INTRAMUSCULAR | Status: DC | PRN

## 2012-07-03 MED ORDER — NICOTINE 7 MG/24HR TD PT24
7.0000 mg | MEDICATED_PATCH | Freq: Every day | TRANSDERMAL | Status: DC
  Administered 2012-07-03 – 2012-07-21 (×19): 7 mg via TRANSDERMAL
  Filled 2012-07-03 (×25): qty 1

## 2012-07-03 NOTE — Progress Notes (Signed)
Nursing Note: Result of EKG called to Dr.Plotnikov.Added troponin to labs and results to be called.wbb

## 2012-07-03 NOTE — Progress Notes (Signed)
Physical Therapy Session Note  Patient Details  Name: Glenn Maldonado MRN: [id number] Date of Birth: Feb 04, 1961  Today's Date: 07/03/2012 Time: 4132-4401 Time Calculation (min): 25 min  Short Term Goals: Week 1:  PT Short Term Goal 1 (Week 1): Pt will roll R with min assist. PT Short Term Goal 2 (Week 1): Pt will transfer bed>< w/c with mod assist. PT Short Term Goal 3 (Week 1): Pt will stand x 3 minutes during functional activity. PT Short Term Goal 4 (Week 1): Pt will propel w/c x 50' with min assist.  Skilled Therapeutic Interventions/Progress Updates:  Planned AM treatment cancelled due to pt off floor for chest Xray due to chest tightness, fatigue, wet sounding voice.  Wife and pt instructed in HEP to be done in room, 1-2x/day.  neuromuscular re-education for LLE muscle activation and timing, via tactile cues, visual feedback, VCs, forced use.  Pt performed10 x 1each active assist:  bil bridging for LLE position, focusing on = elevation of hips, eccentric control; bil lower trunk rotation with assist for adduction LLE; L hip and knee flexion , focusing on neutral hip  [name] trained in above, including timing of exs: well after meal so that [facility] could be lowered somewhat.  She agreed to help pt with exs over weekend.     Therapy Documentation Precautions:  Precautions Precautions: Fall Precaution Comments: hemiplegia LLE, hypertonus LLE Restrictions Weight Bearing Restrictions: No General:   Vital Signs: Therapy Vitals Temp: 98.4 F (36.9 C) Temp src: Oral Pulse Rate: 80  BP: 170/104 mmHg Patient Position, if appropriate: Lying Oxygen Therapy SpO2: 94 % O2 Device: None (Room air) Pain: Pain Assessment Pain Assessment: No/denies pain     See FIM for current functional status  Therapy/Group: Individual Therapy  Glenn Maldonado 07/03/2012, 4:57 PM

## 2012-07-03 NOTE — Progress Notes (Signed)
Social Work Assessment and Plan Social Work Assessment and Plan  Patient Details  Name: Glenn Maldonado MRN: [id number] Date of Birth: [birth year]  Today's Date: 07/03/2012  Problem List:  Patient Active Problem List  Diagnosis  . 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 HTN (hypertension), uncontrolled  . Leg weakness, bilateral  . Tobacco abuse  . Weight loss, non-intentional  . OSA (obstructive sleep apnea), history of, but with recent weight loss less symptoms  . NICM, EF 35% at cardiac cath, 55% by echo  . CVA (cerebral infarction)   Past Medical History:  Past Medical History  Diagnosis Date  . Hypertension   . Borderline diabetic   . STEMI (ST elevation myocardial infarction) 06/28/2012  . HTN (hypertension), uncontrolled 06/28/2012  . Leg weakness, bilateral 06/28/2012  . Tobacco abuse 06/28/2012  . Weight loss, non-intentional 06/28/2012  . OSA (obstructive sleep apnea), history of, but with recent weight loss less symptoms 06/28/2012  . Hepatitis C 2001    treated with interferon  . NICM (nonischemic cardiomyopathy), EF 35% at cardiac cath 06/28/2012   Past Surgical History: No past surgical history on file. Social History:  reports that he has been smoking.  He has never used smokeless tobacco. He reports that he does not drink alcohol or use illicit drugs.  Family / Support Systems Marital Status: Married Patient Roles: Spouse;Parent;Other (Comment) (Employee) Spouse/Significant Other: Lupita Leash  [phone]   [phone] Children: Son and Daughter in [facility] Other Supports: Kathie Rhodes Aldridge-M-I-L  [phone]  [phone] Anticipated Caregiver: Wife and family Ability/Limitations of Caregiver: Between family will have 24 hr care- main caregiver will be wife Caregiver Availability: 24/7 Family Dynamics: Very close-knit family who are there for one another.  Most look up to pt due to how he has taken them in and been a  role model for them.  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  She reports pt is very independent and wants to do for himself.  He is proud and doesn't like to ask others for help. Both have been under a great deal of stress with finances, etc. Recent Psychosocial Issues: Other health issues-BP and wife's cancer last year, along with finances-being owed money. Pyschiatric History: History of anxiety takes meds, but seems to be worst now he has had a stroke, according to wife.  She reports: " He doesn't want me to leave him here alone, so I will figure something out to be here."  Would benefit from neuropsych eval Substance Abuse History: No Etoh-tobacco-does smoke a pack  a day.  Patient / Family Perceptions, Expectations & Goals Pt/Family understanding of illness & functional limitations: Wife is able to expalin pt's medical issues.  She reports she was told he had five strokes due to blood pressure issues.  She had a  father who was paralyzed and helped with him.  She is very committed to pt and will do whatever is needed to assist. Premorbid pt/family roles/activities: Husband, Father, Self Employed, Mining engineer, etc Anticipated changes in  roles/activities/participation: Resume at discharge Pt/family expectations/goals: Wife states: " He is afraid I will leave him because he is paralyzed, no way. "  She states: " They have been together too long and through too much."  She is calming to pt when she is here.  Community Resources [employer]: None Premorbid Home Care/DME Agencies: None Transportation available at discharge: Wife Resource referrals recommended: Support group (specify);Neuropsychology (CVA Support Group)  Discharge Planning Living Arrangements: Spouse/significant other Support Systems: Spouse/significant other;Children;Other relatives;Friends/neighbors;Church/faith community Type of Residence: Private residence Insurance Resources: Customer service manager Resources: Employment;Other (Comment) (Apply for SSD and Medicaid) Financial Screen Referred: Yes Living Expenses: Lives with family Money Management: Patient;Spouse Do you have any problems obtaining your medications?: Yes (Describe) (No insurance-cost if high would be an issue) Home Management: Wife and Pt Patient/Family Preliminary Plans: Return home with wife providing the majority of his care, but will make sure someone is with him 24 hours a day.  Wife realistic regarding his progress and his care.   Social Work Anticipated Follow Up Needs: HH/OP;Support Group DC Planning Additional Notes/Comments: Pt will need to apply for SSD and Medicaid, since such a stressor for him and wife-finances.  Will get the ball rolling for both.  Clinical Impression Wife is very pleasant and realistic wants him to get as high level as possible but aware he will require care at discharge.  Pt would benefit from neuro-psych eval and assistance with coping. Will work with wife on SSD and [employer].  PA aware meds cost would be an issue so be aware of what is prescribed.  Provide support to both pt and wife while here.  Lucy Chris 07/03/2012, 1:58 PM

## 2012-07-03 NOTE — Progress Notes (Signed)
Nursing Note:A: Called result of BNP, and Bmet and CK.R: Obtained order for lasix 40 mg IV x1 and IV fluids capped per orders.Pt resting quietly after HHN tx and oxygen.Lasix to given once available.wbb

## 2012-07-03 NOTE — Progress Notes (Signed)
Occupational Therapy Session Note  Patient Details  Name: Glenn Maldonado MRN: [id number] Date of Birth: 1961/09/17  Today's Date: 07/03/2012 Time: 0730-0830 Time Calculation (min): 60 min  Short Term Goals: Week 1:  OT Short Term Goal 1 (Week 1): Patient will increase grooming to Min Assist sitting at [employer]. OT Short Term Goal 2 (Week 1): Patient will increase UB dressing to Min Assist with hemi-dressing techniques. OT Short Term Goal 3 (Week 1): Patient will increase LB bathing to Mod Assist with long handled sponge. OT Short Term Goal 4 (Week 1): Patient will increase LB Dressing to Max Assist with hemi dressing techniques. OT Short Term Goal 5 (Week 1): Patient will increase toilet transfer to Max Assist with grab bar and elevated toilet seat.  Skilled Therapeutic Interventions/Progress Updates:  ADL re-training session completed at sink this AM. Shower not safe at this time due to poor sitting balance. Session with focus on hemi-dressing techniques, functional transfers, standing balance, memory, attention to task, and direction following. Patient needed Mod vc's to remain on task at times during ADL. Patient expressed being extremely tired and not sleeping well. Patient also complained of nausea. Patient needed frequent rest breaks during bathing and dressing. Pt's wife did not arrive until the end of ADL which made patient anxious at times. When patient stood at sink, therapist blocked left knee while nurse tech pulled up pants and switch recliner out of w/c. After standing patient complained of SOB. Nurse and PA were notified and patient was left in room with nursing, wife, and PA.   Therapy Documentation Precautions:  Precautions Precautions: Fall Precaution Comments: hemiplegia LLE, hypertonus LLE Restrictions Weight Bearing Restrictions: No Pain: Pain Assessment Pain Assessment: No/denies pain Pain Score: 0-No pain Faces Pain Scale: No hurt  See FIM for current functional  status  Therapy/Group: Individual Therapy  Limmie Patricia, OTR/L 07/03/2012, 10:01 AM

## 2012-07-03 NOTE — Progress Notes (Signed)
Nursing Note: D: Pt c/o feeling short of breathe.States he is struggling to get his breath.Pt c/o same early am and had CXR that was within nl limits.Lungs decreased in L U Q w mild rhonci.T-98.5 p-72 r 20 but shallow breaths BP 185/ 103  PO2 97% on r/a.Paged rapid response nurse,unable to come at this time,instructed to page resp.therapy.Paged resp. and they came  in to see pt and assessed the same as I did.A:Paged on-call and gave pt c/o  SOB,sx ,vitals dx and hx.R: Obtained orders for EKG now,BET,BNP,CKMB,D-dimer,nebs albuterol Q6 hrs and d/c lisinopril and add losartan 100 mg daily.Oxygen 2L n/c applied.Resp. stillon unit and made aware of orders for [facility] Tx.wbb

## 2012-07-03 NOTE — Progress Notes (Signed)
Speech Language Pathology Daily Session Note  Patient Details  Name: Glenn Maldonado MRN: [id number] Date of Birth: 1961/04/24  Today's Date: 07/03/2012 Time: 1130-1230 Time Calculation (min): 60 min  Short Term Goals: Week 1: SLP Short Term Goal 1 (Week 1): Patient will consume Dys.2 textures and honey-thick liquids with min assist verbal cues to utilize compensatory strategies which prevent overt s/s of aspiraiton SLP Short Term Goal 2 (Week 1): Patient will sustain attention to basic, familiar task for 5 minutes with min assist verbal cues. SLP Short Term Goal 3 (Week 1): Patient will perform oral motor and diaphragmatic breahting exercises with mod assist verbal and visual cues. SLP Short Term Goal 4 (Week 1): Patient will utilize speech intelligibility strategies during conversation with min assist verbal cues  SLP Short Term Goal 5 (Week 1): Patient will request help as needed during completion of basic ADLs with min assist verbal/question cues.  Skilled Therapeutic Interventions: Treatment session focused on addressing dysphagia and cognition goals during a self-feeding task.  Physician assistant requested SLP consider if patient is a good candidate for the water protocol.  SLP facilitated session with max assist to arouse and mod assist to sustain attention to tasks for 1 minute increments.  SLP also set up of bedside suctioning with oral care attachment; patient completed oral care with min assist verbal cues for thoroughness.  SLP provided patient with 2 cup sips of water which resulted in audible pharyngeal wheeze/congestion which patient was unable to clear with throat clear or cough despite SLP's cues to utilize right upper extremity to assist in coordination of cough response.  Due to the patient's continued lethargy, absent cough response, limited mobility and pharyngeal congestion which patient is unable to clear this SLP does not recommend this patient for the Free Water Protocol.   SLP thickened 2% milk to a honey-thick consistency for patient and he consumed 1 cup and 25% of Dys.1 meal without observed s/s of aspiration.  SLP educated wife regarding strategy for feeding patient small frequent meals and thickening 2% milk to a honey-thick consistency and wife verbalized understanding as well as continued want for puree consistencies despite patient refusing to eat pureed meats.  SLP recommends that patient consume Dys.1 textures per wife's wishes over the weekend to allow tongue to heal and SLP will reassess upgrade to Dys.2 during trial meal on Monday September 2nd.    FIM:  Comprehension Comprehension Mode: Auditory Comprehension: 5-Understands basic 90% of the time/requires cueing < 10% of the time Expression Expression Mode: Verbal Expression: 3-Expresses basic 50 - 74% of the time/requires cueing 25 - 50% of the time. Needs to repeat parts of sentences. Social Interaction Social Interaction: 5-Interacts appropriately 90% of the time - Needs monitoring or encouragement for participation or interaction. Problem Solving Problem Solving: 2-Solves basic 25 - 49% of the time - needs direction more than half the time to initiate, plan or complete simple activities Memory Memory: 4-Recognizes or recalls 75 - 89% of the time/requires cueing 10 - 24% of the time FIM - Eating Eating Activity: 4: Helper checks for pocketed food  Pain Pain Assessment Pain Assessment: No/denies pain Pain Score: 0-No pain  Therapy/Group: Individual Therapy  Charlane Ferretti., CCC-SLP [phone]  Abree Romick 07/03/2012, 1:33 PM

## 2012-07-03 NOTE — Progress Notes (Signed)
Patient ID: Glenn Maldonado, male   DOB: May 31, 1961, 51 y.o.   MRN: [id number]  Subjective/Complaints: Nausea and epigastric discomfort, no abd pain or SOB, no arm or jaw pain no diaphoresis Slept poorly due to "dreams"  Objective: Vital Signs: Blood pressure 188/93, pulse 69, temperature 97.8 F (36.6 C), temperature source Oral, resp. rate 18, height 5\' 10"  (1.778 m), weight 80 kg (176 lb 5.9 oz), SpO2 100.00%. No results found. Results for orders placed during the hospital encounter of 07/01/12 (from the past 72 hour(s))  URINALYSIS, ROUTINE W REFLEX MICROSCOPIC     Status: Abnormal   Collection Time   07/01/12  4:23 PM      Component Value Range Comment   Color, Urine YELLOW  YELLOW    APPearance CLEAR  CLEAR    Specific Gravity, Urine 1.013  1.005 - 1.030    pH 8.5 (*) 5.0 - 8.0    Glucose, UA 100 (*) NEGATIVE mg/dL    Hgb urine dipstick NEGATIVE  NEGATIVE    Bilirubin Urine NEGATIVE  NEGATIVE    Ketones, ur 15 (*) NEGATIVE mg/dL    Protein, ur NEGATIVE  NEGATIVE mg/dL    Urobilinogen, UA 2.0 (*) 0.0 - 1.0 mg/dL    Nitrite NEGATIVE  NEGATIVE    Leukocytes, UA NEGATIVE  NEGATIVE MICROSCOPIC NOT DONE ON URINES WITH NEGATIVE PROTEIN, BLOOD, LEUKOCYTES, NITRITE, OR GLUCOSE <1000 mg/dL.  URINE CULTURE     Status: Normal   Collection Time   07/01/12  4:23 PM      Component Value Range Comment   Specimen Description URINE, CLEAN CATCH      Special Requests NONE      Culture  Setup Time 07/01/2012 16:48      Colony Count NO GROWTH      Culture NO GROWTH      Report Status 07/02/2012 FINAL     GLUCOSE, CAPILLARY     Status: Abnormal   Collection Time   07/01/12  4:37 PM      Component Value Range Comment   Glucose-Capillary 134 (*) 70 - 99 mg/dL    Comment 1 Notify RN     GLUCOSE, CAPILLARY     Status: Normal   Collection Time   07/01/12 10:08 PM      Component Value Range Comment   Glucose-Capillary 88  70 - 99 mg/dL    Comment 1 Notify RN     CBC WITH DIFFERENTIAL     Status:  Abnormal   Collection Time   07/02/12  7:20 AM      Component Value Range Comment   WBC 11.1 (*) 4.0 - 10.5 K/uL    RBC 5.27  4.22 - 5.81 MIL/uL    Hemoglobin 15.1  13.0 - 17.0 g/dL    HCT 40.9  81.1 - 91.4 %    MCV 83.3  78.0 - 100.0 fL    MCH 28.7  26.0 - 34.0 pg    MCHC 34.4  30.0 - 36.0 g/dL    RDW 78.2  95.6 - 21.3 %    Platelets 240  150 - 400 K/uL    Neutrophils Relative 56  43 - 77 %    Neutro Abs 6.2  1.7 - 7.7 K/uL    Lymphocytes Relative 29  12 - 46 %    Lymphs Abs 3.2  0.7 - 4.0 K/uL    Monocytes Relative 10  3 - 12 %    Monocytes Absolute 1.2 (*) 0.1 - 1.0  K/uL    Eosinophils Relative 4  0 - 5 %    Eosinophils Absolute 0.4  0.0 - 0.7 K/uL    Basophils Relative 1  0 - 1 %    Basophils Absolute 0.1  0.0 - 0.1 K/uL   COMPREHENSIVE METABOLIC PANEL     Status: Abnormal   Collection Time   07/02/12  7:20 AM      Component Value Range Comment   Sodium 142  135 - 145 mEq/L    Potassium 3.5  3.5 - 5.1 mEq/L    Chloride 104  96 - 112 mEq/L    CO2 28  19 - 32 mEq/L    Glucose, Bld 121 (*) 70 - 99 mg/dL    BUN 14  6 - 23 mg/dL    Creatinine, Ser 1.61  0.50 - 1.35 mg/dL    Calcium 9.4  8.4 - 09.6 mg/dL    Total Protein 7.0  6.0 - 8.3 g/dL    Albumin 3.3 (*) 3.5 - 5.2 g/dL    AST 17  0 - 37 U/L    ALT 11  0 - 53 U/L    Alkaline Phosphatase 81  39 - 117 U/L    Total Bilirubin 0.5  0.3 - 1.2 mg/dL    GFR calc non Af Amer >90  >90 mL/min    GFR calc Af Amer >90  >90 mL/min   GLUCOSE, CAPILLARY     Status: Abnormal   Collection Time   07/02/12  7:44 AM      Component Value Range Comment   Glucose-Capillary 109 (*) 70 - 99 mg/dL    Comment 1 Notify RN     GLUCOSE, CAPILLARY     Status: Abnormal   Collection Time   07/02/12 11:54 AM      Component Value Range Comment   Glucose-Capillary 140 (*) 70 - 99 mg/dL    Comment 1 Notify RN     GLUCOSE, CAPILLARY     Status: Abnormal   Collection Time   07/02/12  4:28 PM      Component Value Range Comment   Glucose-Capillary 170  (*) 70 - 99 mg/dL   GLUCOSE, CAPILLARY     Status: Abnormal   Collection Time   07/02/12  8:57 PM      Component Value Range Comment   Glucose-Capillary 155 (*) 70 - 99 mg/dL    Comment 1 Notify RN     GLUCOSE, CAPILLARY     Status: Abnormal   Collection Time   07/03/12  7:22 AM      Component Value Range Comment   Glucose-Capillary 118 (*) 70 - 99 mg/dL      HEENT: poor dentition Cardio: RRR Resp: CTA B/L after clears with cough otherwise upper airway sounds GI: BS positive Extremity:  Edema mild L dorsum hand Skin:   Intact Neuro: Lethargic, Cranial Nerve Abnormalities R central 7, Abnormal Motor 1/5 L FF,2/5 L deltoid, biceps and triceps, Abnormal FMC Ataxic/ dec FMC and Tone  increased extensor tone in LUE, Tone:  Hypertonia and Dysarthric Musc/Skel:  Normal   Assessment/Plan: 1. 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 Physiatrist is providing close team supervision and 24 hour management of active medical problems listed below. Physiatrist and rehab team continue to assess barriers to discharge/monitor patient progress toward functional and medical goals. FIM: FIM - Bathing Bathing Steps Patient  Completed: Chest;Right Arm;Left Arm;Abdomen;Front perineal area;Buttocks;Right upper leg;Left upper leg;Right lower leg (including foot);Left lower leg (including foot) Bathing: 3: Mod-Patient completes 5-7 67f 10 parts or 50-74%  FIM - Upper Body Dressing/Undressing Upper body dressing/undressing steps patient completed: Thread/unthread right sleeve of pullover shirt/dresss;Thread/unthread left sleeve of pullover shirt/dress;Put head through opening of pull over shirt/dress;Pull shirt over trunk Upper body dressing/undressing: 3: Mod-Patient completed 50-74% of tasks FIM - Lower Body Dressing/Undressing Lower body dressing/undressing steps patient completed:  Thread/unthread right underwear leg;Thread/unthread left underwear leg;Pull underwear up/down;Thread/unthread right pants leg;Thread/unthread left pants leg;Pull pants up/down;Don/Doff right sock;Don/Doff left sock Lower body dressing/undressing: 1: Two helpers  FIM - Toileting Toileting: 0: Activity did not occur  FIM - Archivist Transfers: 0-Activity did not occur  FIM - Games developer Transfer: 2: Bed > Chair or W/C: Max A (lift and lower assist)  FIM - Locomotion: Wheelchair Locomotion: Wheelchair: 0: Activity did not occur (pt too dizzy to attempt) FIM - Locomotion: Ambulation Locomotion: Ambulation: 0: Activity did not occur (pt is pre-gait)  Comprehension Comprehension Mode: Auditory Comprehension: 5-Understands basic 90% of the time/requires cueing < 10% of the time  Expression Expression Mode: Verbal Expression: 3-Expresses basic 50 - 74% of the time/requires cueing 25 - 50% of the time. Needs to repeat parts of sentences.  Social Interaction Social Interaction: 5-Interacts appropriately 90% of the time - Needs monitoring or encouragement for participation or interaction.  Problem Solving Problem Solving: 2-Solves basic 25 - 49% of the time - needs direction more than half the time to initiate, plan or complete simple activities  Memory Memory: 4-Recognizes or recalls 75 - 89% of the time/requires cueing 10 - 24% of the time  Medical Problem List and Plan:  1. DVT Prophylaxis/Anticoagulation: Pharmaceutical: Lovenox  2. Pain Management: Prn ultram for headaches.  3. Mood: will need a lot of ego support as high levels of frustration noted. Continue Klonopin at bedtime. neuropsych assessment and recs would be helpful as well.  4. Neuropsych: This patient is capable of making decisions on his/her own behalf.  5. STEMI/Non-obstructiveCAD: Treated medically. Ace initiated for BP control.  6. Non-ischemic CM: HTN v/s CVA related.  7. Borderline  DM :Hgb A1C at 6.8. Continue CBG checks AC/HS with SSI. May need oral agent.  8. HTN: Will monitor with bid checks. Avoid hypotension to prevent hypoperfusion.  9. Dyslipidemia: continue lipitor.  10. Leucocytosis: likely reactive. Will check UA/UCS 11. Poor po intake supplement with IVF, only if po <1013ml   LOS (Days) 2 A FACE TO FACE EVALUATION WAS PERFORMED  Halie Gass E 07/03/2012, 7:59 AM

## 2012-07-03 NOTE — Progress Notes (Signed)
Social Work Patient ID: Glenn Maldonado, male   DOB: 05-May-1961, 51 y.o.   MRN: [id number] Met with pt and wife to inform referral has been made to [facility] to assist them with the SSD application.  Will contact wife to begin application.   This worker is checking into the durable POA for wife and trying to find a free form and than work on notoray of this form.  Pt's children walked in while worker was  In room, he teared up and was so glad to see them.  They had just gotten here from [facility] to see him.  Continue to work on discharge plans.

## 2012-07-03 NOTE — Progress Notes (Signed)
RT called to room due to pt having difficulty taking a deep breath. SpO2 95% on RA. Pt is very anxious at this time. BBS-rhonchi. Pt placed on 2L [place] at this time. RN notifying MD.

## 2012-07-04 ENCOUNTER — Inpatient Hospital Stay (HOSPITAL_COMMUNITY): Payer: Medicaid - Out of State | Admitting: Occupational Therapy

## 2012-07-04 ENCOUNTER — Inpatient Hospital Stay (HOSPITAL_COMMUNITY): Payer: Medicaid - Out of State | Admitting: Physical Therapy

## 2012-07-04 ENCOUNTER — Inpatient Hospital Stay (HOSPITAL_COMMUNITY): Payer: Medicaid - Out of State | Admitting: Speech Pathology

## 2012-07-04 LAB — GLUCOSE, CAPILLARY
Glucose-Capillary: 139 mg/dL — ABNORMAL HIGH (ref 70–99)
Glucose-Capillary: 142 mg/dL — ABNORMAL HIGH (ref 70–99)

## 2012-07-04 MED ORDER — ALBUTEROL SULFATE (5 MG/ML) 0.5% IN NEBU
2.5000 mg | INHALATION_SOLUTION | Freq: Three times a day (TID) | RESPIRATORY_TRACT | Status: DC
  Administered 2012-07-04 – 2012-07-14 (×25): 2.5 mg via RESPIRATORY_TRACT
  Filled 2012-07-04 (×26): qty 0.5

## 2012-07-04 NOTE — Progress Notes (Signed)
Nursing Note: Results of troponin and D-dimer called to Dr>Plotnikov.Pt asleep.Resting quietly.wbb

## 2012-07-04 NOTE — Progress Notes (Signed)
Nursing Note: Pt had large ,thick,brown,liquid stool w/ moderate formed stool too.Pt cleaned and complete bed change.wbb

## 2012-07-04 NOTE — Progress Notes (Signed)
Nursing Note: Pt had incont. Void and wet bed,floor and attempting to get up w/o staff assist.Wife at bedside .States that she called for help x3 but no one came .Appologized and made her aware that I was not informed that she had called.Pt up to w/c.Bed cleaned,pillows cleaned ,pt cleaned and back to bed and BED ALARM on. And rails at bottom up as pt 's legs can come off the bed at times.T-97.5 P-74 R-18 BP-155/84 PO@ 98% on 2L n/c.Pt resting quietly w/o any distress.wbb

## 2012-07-04 NOTE — Progress Notes (Signed)
Occupational Therapy Session Note  Patient Details  Name: Glenn Maldonado MRN: [id number] Date of Birth: 1960/12/08  Today's Date: 07/04/2012 Time: 1330-1415 and 0900-1010 Time Calculation (min): 45 min nand 80=125 Skilled Therapeutic Interventions/Progress Updates:   AM Session:  ADL in w/c with patient education with wife as she was supportive and able to demonstrate safe assistance while offering opportunity for patient to gain more safety awareness and independence with self care  PM session:  L UE neuromuscular therapy      Therapy Documentation Precautions:  Precautions Precautions: Fall Precaution Comments: hemiplegia LLE, hypertonus LLE Restrictions Weight Bearing Restrictions: No Pain:bilateral shoulders but unable to rate   See FIM for current functional status  Therapy/Group: Individual Therapy  Rozelle Logan 07/04/2012, 7:32 PM

## 2012-07-04 NOTE — Progress Notes (Signed)
Subjective/Complaints: C/o SOB last night. He got bettrer after IV Lasix. IV NS was capped. No nausea and epigastric discomfort, no abd pain or SOB, no arm or jaw pain no diaphoresis Slept poorly due to "dreams" the night before...  Objective: Vital Signs: Blood pressure 155/84, pulse 74, temperature 97.5 F (36.4 C), temperature source Oral, resp. rate 18, height 5\' 10"  (1.778 m), weight 176 lb 5.9 oz (80 kg), SpO2 98.00%. Dg Chest 2 View  07/03/2012  *RADIOLOGY REPORT*  Clinical Data: Cough.  Congestion.  Question aspiration.  CHEST - 2 VIEW  Comparison: Portable chest 06/29/2012.  Findings: Heart size is normal.  The lungs are clear.  No focal airspace disease is evident.  Degenerative changes are present in the thoracic spine.  IMPRESSION:  1.  No acute cardiopulmonary disease.   Original Report Authenticated By: Jamesetta Orleans. MATTERN, M.D.    Results for orders placed during the hospital encounter of 07/01/12 (from the past 72 hour(s))  URINALYSIS, ROUTINE W REFLEX MICROSCOPIC     Status: Abnormal   Collection Time   07/01/12  4:23 PM      Component Value Range Comment   Color, Urine YELLOW  YELLOW    APPearance CLEAR  CLEAR    Specific Gravity, Urine 1.013  1.005 - 1.030    pH 8.5 (*) 5.0 - 8.0    Glucose, UA 100 (*) NEGATIVE mg/dL    Hgb urine dipstick NEGATIVE  NEGATIVE    Bilirubin Urine NEGATIVE  NEGATIVE    Ketones, ur 15 (*) NEGATIVE mg/dL    Protein, ur NEGATIVE  NEGATIVE mg/dL    Urobilinogen, UA 2.0 (*) 0.0 - 1.0 mg/dL    Nitrite NEGATIVE  NEGATIVE    Leukocytes, UA NEGATIVE  NEGATIVE MICROSCOPIC NOT DONE ON URINES WITH NEGATIVE PROTEIN, BLOOD, LEUKOCYTES, NITRITE, OR GLUCOSE <1000 mg/dL.  URINE CULTURE     Status: Normal   Collection Time   07/01/12  4:23 PM      Component Value Range Comment   Specimen Description URINE, CLEAN CATCH      Special Requests NONE      Culture  Setup Time 07/01/2012 16:48      Colony Count NO GROWTH      Culture NO GROWTH      Report  Status 07/02/2012 FINAL     GLUCOSE, CAPILLARY     Status: Abnormal   Collection Time   07/01/12  4:37 PM      Component Value Range Comment   Glucose-Capillary 134 (*) 70 - 99 mg/dL    Comment 1 Notify RN     GLUCOSE, CAPILLARY     Status: Normal   Collection Time   07/01/12 10:08 PM      Component Value Range Comment   Glucose-Capillary 88  70 - 99 mg/dL    Comment 1 Notify RN     CBC WITH DIFFERENTIAL     Status: Abnormal   Collection Time   07/02/12  7:20 AM      Component Value Range Comment   WBC 11.1 (*) 4.0 - 10.5 K/uL    RBC 5.27  4.22 - 5.81 MIL/uL    Hemoglobin 15.1  13.0 - 17.0 g/dL    HCT 16.1  09.6 - 04.5 %    MCV 83.3  78.0 - 100.0 fL    MCH 28.7  26.0 - 34.0 pg    MCHC 34.4  30.0 - 36.0 g/dL    RDW 40.9  81.1 - 91.4 %  Platelets 240  150 - 400 K/uL    Neutrophils Relative 56  43 - 77 %    Neutro Abs 6.2  1.7 - 7.7 K/uL    Lymphocytes Relative 29  12 - 46 %    Lymphs Abs 3.2  0.7 - 4.0 K/uL    Monocytes Relative 10  3 - 12 %    Monocytes Absolute 1.2 (*) 0.1 - 1.0 K/uL    Eosinophils Relative 4  0 - 5 %    Eosinophils Absolute 0.4  0.0 - 0.7 K/uL    Basophils Relative 1  0 - 1 %    Basophils Absolute 0.1  0.0 - 0.1 K/uL   COMPREHENSIVE METABOLIC PANEL     Status: Abnormal   Collection Time   07/02/12  7:20 AM      Component Value Range Comment   Sodium 142  135 - 145 mEq/L    Potassium 3.5  3.5 - 5.1 mEq/L    Chloride 104  96 - 112 mEq/L    CO2 28  19 - 32 mEq/L    Glucose, Bld 121 (*) 70 - 99 mg/dL    BUN 14  6 - 23 mg/dL    Creatinine, Ser 5.62  0.50 - 1.35 mg/dL    Calcium 9.4  8.4 - 13.0 mg/dL    Total Protein 7.0  6.0 - 8.3 g/dL    Albumin 3.3 (*) 3.5 - 5.2 g/dL    AST 17  0 - 37 U/L    ALT 11  0 - 53 U/L    Alkaline Phosphatase 81  39 - 117 U/L    Total Bilirubin 0.5  0.3 - 1.2 mg/dL    GFR calc non Af Amer >90  >90 mL/min    GFR calc Af Amer >90  >90 mL/min   GLUCOSE, CAPILLARY     Status: Abnormal   Collection Time   07/02/12  7:44 AM       Component Value Range Comment   Glucose-Capillary 109 (*) 70 - 99 mg/dL    Comment 1 Notify RN     GLUCOSE, CAPILLARY     Status: Abnormal   Collection Time   07/02/12 11:54 AM      Component Value Range Comment   Glucose-Capillary 140 (*) 70 - 99 mg/dL    Comment 1 Notify RN     GLUCOSE, CAPILLARY     Status: Abnormal   Collection Time   07/02/12  4:28 PM      Component Value Range Comment   Glucose-Capillary 170 (*) 70 - 99 mg/dL   GLUCOSE, CAPILLARY     Status: Abnormal   Collection Time   07/02/12  8:57 PM      Component Value Range Comment   Glucose-Capillary 155 (*) 70 - 99 mg/dL    Comment 1 Notify RN     GLUCOSE, CAPILLARY     Status: Abnormal   Collection Time   07/03/12  7:22 AM      Component Value Range Comment   Glucose-Capillary 118 (*) 70 - 99 mg/dL   BASIC METABOLIC PANEL     Status: Abnormal   Collection Time   07/03/12  9:12 AM      Component Value Range Comment   Sodium 141  135 - 145 mEq/L    Potassium 3.8  3.5 - 5.1 mEq/L    Chloride 102  96 - 112 mEq/L    CO2 29  19 - 32 mEq/L  Glucose, Bld 128 (*) 70 - 99 mg/dL    BUN 18  6 - 23 mg/dL    Creatinine, Ser 7.84  0.50 - 1.35 mg/dL    Calcium 9.5  8.4 - 69.6 mg/dL    GFR calc non Af Amer >90  >90 mL/min    GFR calc Af Amer >90  >90 mL/min   CULTURE, EXPECTORATED SPUTUM-ASSESSMENT     Status: Normal   Collection Time   07/03/12 10:26 AM      Component Value Range Comment   Specimen Description SPUTUM      Special Requests Normal      Sputum evaluation        Value: THIS SPECIMEN IS ACCEPTABLE. RESPIRATORY CULTURE REPORT TO FOLLOW.   Report Status 07/03/2012 FINAL     CULTURE, RESPIRATORY     Status: Normal (Preliminary result)   Collection Time   07/03/12 10:26 AM      Component Value Range Comment   Specimen Description SPUTUM      Special Requests NONE      Gram Stain        Value: ABUNDANT WBC PRESENT,BOTH PMN AND MONONUCLEAR     FEW SQUAMOUS EPITHELIAL CELLS PRESENT     MODERATE GRAM POSITIVE  COCCI     IN PAIRS   Culture PENDING      Report Status PENDING     GLUCOSE, CAPILLARY     Status: Abnormal   Collection Time   07/03/12 11:12 AM      Component Value Range Comment   Glucose-Capillary 101 (*) 70 - 99 mg/dL   GLUCOSE, CAPILLARY     Status: Abnormal   Collection Time   07/03/12  4:14 PM      Component Value Range Comment   Glucose-Capillary 121 (*) 70 - 99 mg/dL   GLUCOSE, CAPILLARY     Status: Abnormal   Collection Time   07/03/12  9:03 PM      Component Value Range Comment   Glucose-Capillary 113 (*) 70 - 99 mg/dL    Comment 1 Notify RN     BASIC METABOLIC PANEL     Status: Abnormal   Collection Time   07/03/12 10:40 PM      Component Value Range Comment   Sodium 137  135 - 145 mEq/L    Potassium 3.7  3.5 - 5.1 mEq/L    Chloride 103  96 - 112 mEq/L    CO2 26  19 - 32 mEq/L    Glucose, Bld 126 (*) 70 - 99 mg/dL    BUN 18  6 - 23 mg/dL    Creatinine, Ser 2.95  0.50 - 1.35 mg/dL    Calcium 9.2  8.4 - 28.4 mg/dL    GFR calc non Af Amer >90  >90 mL/min    GFR calc Af Amer >90  >90 mL/min   CK TOTAL AND CKMB     Status: Normal   Collection Time   07/03/12 10:40 PM      Component Value Range Comment   Total CK 69  7 - 232 U/L    CK, MB 2.5  0.3 - 4.0 ng/mL    Relative Index RELATIVE INDEX IS INVALID  0.0 - 2.5   D-DIMER, QUANTITATIVE     Status: Abnormal   Collection Time   07/03/12 10:40 PM      Component Value Range Comment   D-Dimer, Quant 0.66 (*) 0.00 - 0.48 ug/mL-FEU   PRO B NATRIURETIC  PEPTIDE     Status: Abnormal   Collection Time   07/03/12 10:40 PM      Component Value Range Comment   Pro B Natriuretic peptide (BNP) 1445.0 (*) 0 - 125 pg/mL   TROPONIN I     Status: Normal   Collection Time   07/03/12 10:40 PM      Component Value Range Comment   Troponin I <0.30  <0.30 ng/mL   GLUCOSE, CAPILLARY     Status: Abnormal   Collection Time   07/04/12  7:48 AM      Component Value Range Comment   Glucose-Capillary 121 (*) 70 - 99 mg/dL    Comment 1  Documented in Chart        HEENT: poor dentition Cardio: RRR Resp: CTA B/L after clears with cough otherwise upper airway sounds. No wheezes GI: BS positive Extremity:  Edema mild L dorsum hand Skin:   Intact Neuro: Lethargic, Cranial Nerve Abnormalities R central 7, Abnormal Motor 1/5 L FF,2/5 L deltoid, biceps and triceps, Abnormal FMC Ataxic/ dec FMC and Tone  increased extensor tone in LUE, Tone:  Hypertonia and Dysarthric Musc/Skel:  Normal   Assessment/Plan: 1. 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 Physiatrist is providing close team supervision and 24 hour management of active medical problems listed below. Physiatrist and rehab team continue to assess barriers to discharge/monitor patient progress toward functional and medical goals. FIM: FIM - Bathing Bathing Steps Patient Completed: Chest;Right Arm;Left Arm;Abdomen;Front perineal area;Buttocks;Right upper leg;Left upper leg;Right lower leg (including foot);Left lower leg (including foot) Bathing: 3: Mod-Patient completes 5-7 28f 10 parts or 50-74%  FIM - Upper Body Dressing/Undressing Upper body dressing/undressing steps patient completed: Thread/unthread right sleeve of pullover shirt/dresss;Thread/unthread left sleeve of pullover shirt/dress;Put head through opening of pull over shirt/dress;Pull shirt over trunk Upper body dressing/undressing: 3: Mod-Patient completed 50-74% of tasks FIM - Lower Body Dressing/Undressing Lower body dressing/undressing steps patient completed: Thread/unthread right underwear leg;Thread/unthread left underwear leg;Pull underwear up/down;Thread/unthread right pants leg;Thread/unthread left pants leg;Pull pants up/down;Don/Doff right sock;Don/Doff left sock Lower body dressing/undressing: 1: Two helpers  FIM - Toileting Toileting: 0: Activity did not occur  FIM -  Archivist Transfers: 0-Activity did not occur  FIM - Banker Devices: Bed rails Bed/Chair Transfer: 0: Activity did not occur  FIM - Locomotion: Wheelchair Locomotion: Wheelchair: 0: Activity did not occur FIM - Locomotion: Ambulation Locomotion: Ambulation: 0: Activity did not occur  Comprehension Comprehension Mode: Auditory Comprehension: 4-Understands basic 75 - 89% of the time/requires cueing 10 - 24% of the time  Expression Expression Mode: Verbal Expression: 5-Expresses complex 90% of the time/cues < 10% of the time  Social Interaction Social Interaction: 4-Interacts appropriately 75 - 89% of the time - Needs redirection for appropriate language or to initiate interaction.  Problem Solving Problem Solving: 2-Solves basic 25 - 49% of the time - needs direction more than half the time to initiate, plan or complete simple activities  Memory Memory: 3-Recognizes or recalls 50 - 74% of the time/requires cueing 25 - 49% of the time  Medical Problem List and Plan:  1. DVT Prophylaxis/Anticoagulation: Pharmaceutical: Lovenox  2. Pain Management: Prn ultram for headaches.  3. Mood: will need a lot of ego support as high levels of frustration noted. Continue Klonopin at bedtime. neuropsych assessment and recs would be helpful as well.  4. Neuropsych: This patient is  capable of making decisions on his/her own behalf.  5. STEMI/Non-obstructiveCAD: Treated medically. Ace initiated for BP control.  6. Non-ischemic CM: HTN v/s CVA related.  7. Borderline DM :Hgb A1C at 6.8. Continue CBG checks AC/HS with SSI. May need oral agent.  8. HTN: Will monitor with bid checks. Avoid hypotension to prevent hypoperfusion.  9. Dyslipidemia: continue lipitor.  10. Leucocytosis: likely reactive. Will check UA/UCS 11. Poor po intake supplement with IVF, only if po <1083ml 12. Dyspnea due to CHF last night, 8/30, (volume overload), resolved with  IV Lasix. NSL IV NS. Will watch.   LOS (Days) 3 A FACE TO FACE EVALUATION WAS PERFORMED  Glenn Maldonado 07/04/2012, 8:25 AM

## 2012-07-04 NOTE — Progress Notes (Signed)
Speech Language Pathology Daily Session Note  Patient Details  Name: Glenn Maldonado MRN: [id number] Date of Birth: 10/18/1961  Today's Date: 07/04/2012 Time: 1120-1205 Time Calculation (min): 45 min  Short Term Goals: Week 1: SLP Short Term Goal 1 (Week 1): Patient will consume Dys.2 textures and honey-thick liquids with min assist verbal cues to utilize compensatory strategies which prevent overt s/s of aspiraiton SLP Short Term Goal 1 - Progress (Week 1): Progressing toward goal SLP Short Term Goal 2 (Week 1): Patient will sustain attention to basic, familiar task for 5 minutes with min assist verbal cues. SLP Short Term Goal 2 - Progress (Week 1): Progressing toward goal SLP Short Term Goal 3 (Week 1): Patient will perform oral motor and diaphragmatic breahting exercises with mod assist verbal and visual cues. SLP Short Term Goal 3 - Progress (Week 1): Progressing toward goal SLP Short Term Goal 4 (Week 1): Patient will utilize speech intelligibility strategies during conversation with min assist verbal cues  SLP Short Term Goal 5 (Week 1): Patient will request help as needed during completion of basic ADLs with min assist verbal/question cues.  Skilled Therapeutic Interventions: Treatment session focused on review of causes of dysphagia and goal of treatment.  Pt provided with instruction/demonstration/exercises to target posterior pharyngeal wall strengthening (masako maneuver), hyolaryngeal elevation (towel tuck), and glottal closure.  Able to complete exercises with min overall verbal/tactile cues and modeling.  Consumed 3 oz honey-thickened Coke with mod verbal cues to reduce bolus size,  f/u dry swallow, and intermittently clear throat.  Pt with weak vocal fold adduction during cough.  Intermittent coughing with secretions with mod cues needed to increase frequency of dry swallows to prevent accumulation of secretions in pharynx.  Instructed/demonstrated to pt's wife again  method for  thickening liquids. Pt motivated to participate, reporting fatigue as session progressed.  FIM:  Comprehension Comprehension Mode: Auditory Comprehension: 5-Follows basic conversation/direction: With no assist Expression Expression Mode: Verbal Expression: 3-Expresses basic 50 - 74% of the time/requires cueing 25 - 50% of the time. Needs to repeat parts of sentences. Social Interaction Social Interaction: 5-Interacts appropriately 90% of the time - Needs monitoring or encouragement for participation or interaction. Problem Solving Problem Solving: 2-Solves basic 25 - 49% of the time - needs direction more than half the time to initiate, plan or complete simple activities Memory Memory: 4-Recognizes or recalls 75 - 89% of the time/requires cueing 10 - 24% of the time FIM - Eating Eating Activity: 4: Helper checks for pocketed food  Pain Pain Assessment Pain Assessment: No/denies pain Pain Score: 0-No pain  Therapy/Group: Individual Therapy  Bonnell Placzek L. Samson Frederic, [place] CCC/SLP Pager [phone]  Blenda Mounts Laurice 07/04/2012, 12:22 PM

## 2012-07-04 NOTE — Progress Notes (Signed)
Physical Therapy Note  Patient Details  Name: Glenn Maldonado MRN: [id number] Date of Birth: [birth year] Today's Date: 07/04/2012  1345-1440 (55 minutes) individual Pain: 4/10 low back/meds given Focus of treatment: Neuro re-ed LT LE; pre-gait/gait training with appropriate assistive device Treatment: wc mobility- 100 feet min assist using RT extremities (pt fatigues quickly);  Transfers -stand/pivot mod/max assist with vcs or min assist for LT terminal knee extension; Neuro-re ed- LT LE hip flexion/extension using powderboard gravity eliminated ( 50% active hip flexion gravity eliminated/90% hip extension/ 30 % knee extension/ 0% knee flexion); gait 5 steps X 2 with LT PFRW + ace wrap LT ankle mod/max assist with vcs for weight shift to right to allow partial swing on left and terminal knee extension in stance (hyperextension).     1HOUT,JIM 07/04/2012, 8:24 AM

## 2012-07-05 ENCOUNTER — Inpatient Hospital Stay (HOSPITAL_COMMUNITY): Payer: Medicaid - Out of State | Admitting: Physical Therapy

## 2012-07-05 LAB — CULTURE, RESPIRATORY W GRAM STAIN: Culture: NORMAL

## 2012-07-05 LAB — GLUCOSE, CAPILLARY
Glucose-Capillary: 111 mg/dL — ABNORMAL HIGH (ref 70–99)
Glucose-Capillary: 194 mg/dL — ABNORMAL HIGH (ref 70–99)

## 2012-07-05 NOTE — Progress Notes (Signed)
Patient ID: Glenn Maldonado, male   DOB: February 01, 1961, 51 y.o.   MRN: [id number]  Subjective/Complaints: No c/o SOB last night. He got bettrer after IV Lasix on Fri night. IV NS was capped on Fri night as well. No nausea and epigastric discomfort, no abd pain or SOB, no arm or jaw pain no diaphoresis   Objective: Vital Signs: Blood pressure 158/81, pulse 63, temperature 97.9 F (36.6 C), temperature source Oral, resp. rate 16, height 5\' 10"  (1.778 m), weight 176 lb 5.9 oz (80 kg), SpO2 93.00%. Dg Chest 2 View  07/03/2012  *RADIOLOGY REPORT*  Clinical Data: Cough.  Congestion.  Question aspiration.  CHEST - 2 VIEW  Comparison: Portable chest 06/29/2012.  Findings: Heart size is normal.  The lungs are clear.  No focal airspace disease is evident.  Degenerative changes are present in the thoracic spine.  IMPRESSION:  1.  No acute cardiopulmonary disease.   Original Report Authenticated By: Jamesetta Orleans. MATTERN, M.D.    Results for orders placed during the hospital encounter of 07/01/12 (from the past 72 hour(s))  GLUCOSE, CAPILLARY     Status: Abnormal   Collection Time   07/02/12 11:54 AM      Component Value Range Comment   Glucose-Capillary 140 (*) 70 - 99 mg/dL    Comment 1 Notify RN     GLUCOSE, CAPILLARY     Status: Abnormal   Collection Time   07/02/12  4:28 PM      Component Value Range Comment   Glucose-Capillary 170 (*) 70 - 99 mg/dL   GLUCOSE, CAPILLARY     Status: Abnormal   Collection Time   07/02/12  8:57 PM      Component Value Range Comment   Glucose-Capillary 155 (*) 70 - 99 mg/dL    Comment 1 Notify RN     GLUCOSE, CAPILLARY     Status: Abnormal   Collection Time   07/03/12  7:22 AM      Component Value Range Comment   Glucose-Capillary 118 (*) 70 - 99 mg/dL   BASIC METABOLIC PANEL     Status: Abnormal   Collection Time   07/03/12  9:12 AM      Component Value Range Comment   Sodium 141  135 - 145 mEq/L    Potassium 3.8  3.5 - 5.1 mEq/L    Chloride 102  96 - 112 mEq/L     CO2 29  19 - 32 mEq/L    Glucose, Bld 128 (*) 70 - 99 mg/dL    BUN 18  6 - 23 mg/dL    Creatinine, Ser 2.95  0.50 - 1.35 mg/dL    Calcium 9.5  8.4 - 28.4 mg/dL    GFR calc non Af Amer >90  >90 mL/min    GFR calc Af Amer >90  >90 mL/min   CULTURE, EXPECTORATED SPUTUM-ASSESSMENT     Status: Normal   Collection Time   07/03/12 10:26 AM      Component Value Range Comment   Specimen Description SPUTUM      Special Requests Normal      Sputum evaluation        Value: THIS SPECIMEN IS ACCEPTABLE. RESPIRATORY CULTURE REPORT TO FOLLOW.   Report Status 07/03/2012 FINAL     CULTURE, RESPIRATORY     Status: Normal (Preliminary result)   Collection Time   07/03/12 10:26 AM      Component Value Range Comment   Specimen Description SPUTUM  Special Requests NONE      Gram Stain        Value: ABUNDANT WBC PRESENT,BOTH PMN AND MONONUCLEAR     FEW SQUAMOUS EPITHELIAL CELLS PRESENT     MODERATE GRAM POSITIVE COCCI     IN PAIRS   Culture Culture reincubated for better growth      Report Status PENDING     GLUCOSE, CAPILLARY     Status: Abnormal   Collection Time   07/03/12 11:12 AM      Component Value Range Comment   Glucose-Capillary 101 (*) 70 - 99 mg/dL   GLUCOSE, CAPILLARY     Status: Abnormal   Collection Time   07/03/12  4:14 PM      Component Value Range Comment   Glucose-Capillary 121 (*) 70 - 99 mg/dL   GLUCOSE, CAPILLARY     Status: Abnormal   Collection Time   07/03/12  9:03 PM      Component Value Range Comment   Glucose-Capillary 113 (*) 70 - 99 mg/dL    Comment 1 Notify RN     BASIC METABOLIC PANEL     Status: Abnormal   Collection Time   07/03/12 10:40 PM      Component Value Range Comment   Sodium 137  135 - 145 mEq/L    Potassium 3.7  3.5 - 5.1 mEq/L    Chloride 103  96 - 112 mEq/L    CO2 26  19 - 32 mEq/L    Glucose, Bld 126 (*) 70 - 99 mg/dL    BUN 18  6 - 23 mg/dL    Creatinine, Ser 4.09  0.50 - 1.35 mg/dL    Calcium 9.2  8.4 - 81.1 mg/dL    GFR calc non Af  Amer >90  >90 mL/min    GFR calc Af Amer >90  >90 mL/min   CK TOTAL AND CKMB     Status: Normal   Collection Time   07/03/12 10:40 PM      Component Value Range Comment   Total CK 69  7 - 232 U/L    CK, MB 2.5  0.3 - 4.0 ng/mL    Relative Index RELATIVE INDEX IS INVALID  0.0 - 2.5   D-DIMER, QUANTITATIVE     Status: Abnormal   Collection Time   07/03/12 10:40 PM      Component Value Range Comment   D-Dimer, Quant 0.66 (*) 0.00 - 0.48 ug/mL-FEU   PRO B NATRIURETIC PEPTIDE     Status: Abnormal   Collection Time   07/03/12 10:40 PM      Component Value Range Comment   Pro B Natriuretic peptide (BNP) 1445.0 (*) 0 - 125 pg/mL   TROPONIN I     Status: Normal   Collection Time   07/03/12 10:40 PM      Component Value Range Comment   Troponin I <0.30  <0.30 ng/mL   GLUCOSE, CAPILLARY     Status: Abnormal   Collection Time   07/04/12  7:48 AM      Component Value Range Comment   Glucose-Capillary 121 (*) 70 - 99 mg/dL    Comment 1 Documented in Chart     GLUCOSE, CAPILLARY     Status: Abnormal   Collection Time   07/04/12 12:25 PM      Component Value Range Comment   Glucose-Capillary 171 (*) 70 - 99 mg/dL    Comment 1 Notify RN     GLUCOSE, CAPILLARY  Status: Abnormal   Collection Time   07/04/12  5:03 PM      Component Value Range Comment   Glucose-Capillary 139 (*) 70 - 99 mg/dL   GLUCOSE, CAPILLARY     Status: Abnormal   Collection Time   07/04/12  9:20 PM      Component Value Range Comment   Glucose-Capillary 142 (*) 70 - 99 mg/dL   GLUCOSE, CAPILLARY     Status: Abnormal   Collection Time   07/05/12  7:30 AM      Component Value Range Comment   Glucose-Capillary 111 (*) 70 - 99 mg/dL    Comment 1 Notify RN        HEENT: poor dentition Cardio: RRR Resp: CTA B/L after clears with cough otherwise upper airway sounds. No wheezes GI: BS positive Extremity:  Edema mild L dorsum hand Skin:   Intact Neuro: Lethargic, Cranial Nerve Abnormalities R central 7, Abnormal Motor  1/5 L FF,2/5 L deltoid, biceps and triceps, Abnormal FMC Ataxic/ dec FMC and Tone  increased extensor tone in LUE, Tone:  Hypertonia and Dysarthric Musc/Skel:  Normal   Assessment/Plan: 1. 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 Physiatrist is providing close team supervision and 24 hour management of active medical problems listed below. Physiatrist and rehab team continue to assess barriers to discharge/monitor patient progress toward functional and medical goals. FIM: FIM - Bathing Bathing Steps Patient Completed: Chest;Left Arm;Right upper leg;Left upper leg Bathing: 2: Max-Patient completes 3-4 57f 10 parts or 25-49% (wife assisted with periare while cliniican stabilized LEs )  FIM - Upper Body Dressing/Undressing Upper body dressing/undressing steps patient completed: Put head through opening of pull over shirt/dress;Thread/unthread right sleeve of pullover shirt/dresss Upper body dressing/undressing: 3: Mod-Patient completed 50-74% of tasks FIM - Lower Body Dressing/Undressing Lower body dressing/undressing steps patient completed: Thread/unthread right underwear leg;Thread/unthread left underwear leg;Pull underwear up/down;Thread/unthread right pants leg;Thread/unthread left pants leg;Pull pants up/down;Don/Doff right sock;Don/Doff left sock Lower body dressing/undressing: 1: Two helpers  FIM - Toileting Toileting: 0: Activity did not occur  FIM - Archivist Transfers: 0-Activity did not occur  FIM - Banker Devices: Bed rails Bed/Chair Transfer: 3: Supine > Sit: Mod A (lifting assist/Pt. 50-74%/lift 2 legs;2: Bed > Chair or W/C: Max A (lift and lower assist)  FIM - Locomotion: Wheelchair Locomotion: Wheelchair: 0: Activity did not occur FIM - Locomotion: Ambulation Locomotion: Ambulation: 0:  Activity did not occur  Comprehension Comprehension Mode: Auditory Comprehension: 5-Follows basic conversation/direction: With no assist  Expression Expression Mode: Verbal Expression: 3-Expresses basic 50 - 74% of the time/requires cueing 25 - 50% of the time. Needs to repeat parts of sentences.  Social Interaction Social Interaction: 5-Interacts appropriately 90% of the time - Needs monitoring or encouragement for participation or interaction.  Problem Solving Problem Solving: 2-Solves basic 25 - 49% of the time - needs direction more than half the time to initiate, plan or complete simple activities  Memory Memory: 4-Recognizes or recalls 75 - 89% of the time/requires cueing 10 - 24% of the time  Medical Problem List and Plan:  1. DVT Prophylaxis/Anticoagulation: Pharmaceutical: Lovenox  2. Pain Management: Prn ultram for headaches.  3. Mood: will need a lot of ego support as high levels of frustration noted. Continue Klonopin at bedtime. neuropsych assessment and recs would be helpful as well.  4. Neuropsych: This patient is capable of  making decisions on his/her own behalf.  5. STEMI/Non-obstructiveCAD: Treated medically. Ace initiated for BP control.  6. Non-ischemic CM: HTN v/s CVA related.  7. Borderline DM :Hgb A1C at 6.8. Continue CBG checks AC/HS with SSI. May need oral agent.  8. HTN: Will monitor with bid checks. Avoid hypotension to prevent hypoperfusion.  9. Dyslipidemia: continue lipitor.  10. Leucocytosis: likely reactive. Will check UA/UCS 11. Poor po intake supplement with IVF, only if po <1034ml 12. Dyspnea due to CHF on 8/30, (volume overload), resolved with IV Lasix. NSL IV NS. Will watch. BMET tomorrow.   LOS (Days) 4 A FACE TO FACE EVALUATION WAS PERFORMED  Alex Plotnikov 07/05/2012, 8:44 AM

## 2012-07-05 NOTE — Progress Notes (Signed)
Physical Therapy Note  Patient Details  Name: Glenn Maldonado MRN: [id number] Date of Birth: [birth year] Today's Date: 07/05/2012  1330-1415 (45 minutes) group Pain: no complaint of pain  Pt impulsive with decreased safety awareness Focus of treatment: Neuro re-ed LT LE; gait training Treatment: Neuro re-ed - sidelying (gravity eliminated) LT LE hip flexion /extension using powder board with improving active hip flexion range (approximately 75% ), knee flexion/extension (pt now initiating active knee flexion in gravity eliminated position) , pt initiating SLR on left with min quad lag; sit to supine (mat) min assist ; supine to side to sit min assist from left ; gait - Lt PFRW + ace wrap LT ankle 10 feet X 2 with mod/max assist for balance with pt pushing to left /Lt knee hyperextension in stance.Pt required mod vcs to weight shift to right to allow step on left.  Poppy Mcafee,JIM 07/05/2012, 8:06 AM

## 2012-07-06 ENCOUNTER — Inpatient Hospital Stay (HOSPITAL_COMMUNITY): Payer: Medicaid - Out of State | Admitting: Physical Therapy

## 2012-07-06 ENCOUNTER — Inpatient Hospital Stay (HOSPITAL_COMMUNITY): Payer: Medicaid - Out of State | Admitting: Speech Pathology

## 2012-07-06 ENCOUNTER — Inpatient Hospital Stay (HOSPITAL_COMMUNITY): Payer: Medicaid - Out of State

## 2012-07-06 DIAGNOSIS — I6330 Cerebral infarction due to thrombosis of unspecified cerebral artery: Secondary | ICD-10-CM

## 2012-07-06 DIAGNOSIS — Z5189 Encounter for other specified aftercare: Secondary | ICD-10-CM

## 2012-07-06 DIAGNOSIS — I6789 Other cerebrovascular disease: Secondary | ICD-10-CM

## 2012-07-06 DIAGNOSIS — I10 Essential (primary) hypertension: Secondary | ICD-10-CM

## 2012-07-06 LAB — BASIC METABOLIC PANEL
BUN: 25 mg/dL — ABNORMAL HIGH (ref 6–23)
CO2: 31 mEq/L (ref 19–32)
Chloride: 101 mEq/L (ref 96–112)
Creatinine, Ser: 1.16 mg/dL (ref 0.50–1.35)
Potassium: 3.9 mEq/L (ref 3.5–5.1)

## 2012-07-06 LAB — GLUCOSE, CAPILLARY
Glucose-Capillary: 140 mg/dL — ABNORMAL HIGH (ref 70–99)
Glucose-Capillary: 135 mg/dL — ABNORMAL HIGH (ref 70–99)

## 2012-07-06 NOTE — Progress Notes (Signed)
Physical Therapy Session Note  Patient Details  Name: Glenn Maldonado MRN: [id number] Date of Birth: [birth year]  Today's Date: 07/06/2012 Time: 4098-1191 Time Calculation (min): 28 min  Short Term Goals: Week 1:  PT Short Term Goal 1 (Week 1): Pt will roll R with min assist. PT Short Term Goal 2 (Week 1): Pt will transfer bed>< w/c with mod assist. PT Short Term Goal 3 (Week 1): Pt will stand x 3 minutes during functional activity. PT Short Term Goal 4 (Week 1): Pt will propel w/c x 50' with min assist.  Skilled Therapeutic Interventions/Progress Updates:    One on one treatment session focused on bed mobility and transfers as well as WC education and WC mobility this morning.  Pt was able to roll left modI with railing and then struggled to get to sitting on his own.  Left sidelying to sit was mod assist.  Once sitting he was able to support himself with his right arm and both feet supported with supervision.  He is quite impulsive, though and would let go and reach down for something and lose his balance.  Transfers bed to [facility] towards his right side mod assist with verbal cues for hand placement and safe sequencing and technique.  Once in the [facility] I reviewed with the pt and his wife how to operate the [facility] including breaks, leg rests, etc.  He needed help with the left break and leg rest, but was able to operate the right side without difficutly.  WC mobility 100 ft, verbal cues for how to steer the [facility] and mod assist around obstacles in the hallway.    Therapy Documentation Precautions:  Precautions Precautions: Fall Precaution Comments: hemiplegia LLE, hypertonus LLE Restrictions Weight Bearing Restrictions: No   Pain: Pain Assessment Pain Assessment: No/denies pain Pain Score: 0-No pain Faces Pain Scale: No hurt Pain Type: Acute pain Pain Location: Back Pain Orientation: Lower;Right Pain Intervention(s): Repositioned;Ambulation/increased activity Mobility: Transfers Sit to Stand: 3:  Mod assist Sit to Stand Details: Visual cues/gestures for precautions/safety;Verbal cues for technique;Tactile cues for weight shifting;Tactile cues for posture Stand to Sit: 3: Mod assist Stand to Sit Details (indicate cue type and reason): Verbal cues for sequencing;Visual cues/gestures for precautions/safety;Verbal cues for technique Stand Pivot Transfers: 3: Mod assist Stand Pivot Transfer Details: Verbal cues for technique;Verbal cues for precautions/safety;Tactile cues for weight shifting Locomotion : Corporate treasurer: Yes Wheelchair Assistance: 3: Armed forces technical officer Details: Verbal cues for precautions/safety;Verbal cues for technique;Verbal cues for sequencing Wheelchair Propulsion: Right upper extremity;Right lower extremity Wheelchair Parts Management: Needs assistance Distance: 100   Balance: Static Sitting Balance Static Sitting - Balance Support: Feet supported;Right upper extremity supported Static Sitting - Level of Assistance: 5: Stand by assistance Static Sitting - Comment/# of Minutes: 5 mins EOB  See FIM for current functional status  Therapy/Group: Individual Therapy  Lurena Joiner B. Jago Carton, PT, DPT [phone]   07/06/2012, 12:42 PM

## 2012-07-06 NOTE — Progress Notes (Signed)
Pt. And significant other state Melissa (SLP) gave clearance for patient to have approved pureed "roast beef and gravy".  Family and patient angry that meal was not ordered correctly and stated SLP gave medical clearance.  No order found. However, RN approved order with Tobi Bastos w/ [employer].

## 2012-07-06 NOTE — Progress Notes (Signed)
Patient ID: Glenn Maldonado, male   DOB: April 20, 1961, 51 y.o.   MRN: [id number]  Subjective/Complaints: Walked in therapy, breathing well   Objective: Vital Signs: Blood pressure 159/64, pulse 68, temperature 97.4 F (36.3 C), temperature source Oral, resp. rate 18, height 5\' 10"  (1.778 m), weight 80 kg (176 lb 5.9 oz), SpO2 98.00%. No results found. Results for orders placed during the hospital encounter of 07/01/12 (from the past 72 hour(s))  CULTURE, EXPECTORATED SPUTUM-ASSESSMENT     Status: Normal   Collection Time   07/03/12 10:26 AM      Component Value Range Comment   Specimen Description SPUTUM      Special Requests Normal      Sputum evaluation        Value: THIS SPECIMEN IS ACCEPTABLE. RESPIRATORY CULTURE REPORT TO FOLLOW.   Report Status 07/03/2012 FINAL     CULTURE, RESPIRATORY     Status: Normal   Collection Time   07/03/12 10:26 AM      Component Value Range Comment   Specimen Description SPUTUM      Special Requests NONE      Gram Stain        Value: ABUNDANT WBC PRESENT,BOTH PMN AND MONONUCLEAR     FEW SQUAMOUS EPITHELIAL CELLS PRESENT     MODERATE GRAM POSITIVE COCCI     IN PAIRS   Culture NORMAL OROPHARYNGEAL FLORA      Report Status 07/05/2012 FINAL     GLUCOSE, CAPILLARY     Status: Abnormal   Collection Time   07/03/12 11:12 AM      Component Value Range Comment   Glucose-Capillary 101 (*) 70 - 99 mg/dL   GLUCOSE, CAPILLARY     Status: Abnormal   Collection Time   07/03/12  4:14 PM      Component Value Range Comment   Glucose-Capillary 121 (*) 70 - 99 mg/dL   GLUCOSE, CAPILLARY     Status: Abnormal   Collection Time   07/03/12  9:03 PM      Component Value Range Comment   Glucose-Capillary 113 (*) 70 - 99 mg/dL    Comment 1 Notify RN     BASIC METABOLIC PANEL     Status: Abnormal   Collection Time   07/03/12 10:40 PM      Component Value Range Comment   Sodium 137  135 - 145 mEq/L    Potassium 3.7  3.5 - 5.1 mEq/L    Chloride 103  96 - 112 mEq/L    CO2 26  19 - 32 mEq/L    Glucose, Bld 126 (*) 70 - 99 mg/dL    BUN 18  6 - 23 mg/dL    Creatinine, Ser 3.08  0.50 - 1.35 mg/dL    Calcium 9.2  8.4 - 65.7 mg/dL    GFR calc non Af Amer >90  >90 mL/min    GFR calc Af Amer >90  >90 mL/min   CK TOTAL AND CKMB     Status: Normal   Collection Time   07/03/12 10:40 PM      Component Value Range Comment   Total CK 69  7 - 232 U/L    CK, MB 2.5  0.3 - 4.0 ng/mL    Relative Index RELATIVE INDEX IS INVALID  0.0 - 2.5   D-DIMER, QUANTITATIVE     Status: Abnormal   Collection Time   07/03/12 10:40 PM      Component Value Range Comment   D-Dimer,  Quant 0.66 (*) 0.00 - 0.48 ug/mL-FEU   PRO B NATRIURETIC PEPTIDE     Status: Abnormal   Collection Time   07/03/12 10:40 PM      Component Value Range Comment   Pro B Natriuretic peptide (BNP) 1445.0 (*) 0 - 125 pg/mL   TROPONIN I     Status: Normal   Collection Time   07/03/12 10:40 PM      Component Value Range Comment   Troponin I <0.30  <0.30 ng/mL   GLUCOSE, CAPILLARY     Status: Abnormal   Collection Time   07/04/12  7:48 AM      Component Value Range Comment   Glucose-Capillary 121 (*) 70 - 99 mg/dL    Comment 1 Documented in Chart     GLUCOSE, CAPILLARY     Status: Abnormal   Collection Time   07/04/12 12:25 PM      Component Value Range Comment   Glucose-Capillary 171 (*) 70 - 99 mg/dL    Comment 1 Notify RN     GLUCOSE, CAPILLARY     Status: Abnormal   Collection Time   07/04/12  5:03 PM      Component Value Range Comment   Glucose-Capillary 139 (*) 70 - 99 mg/dL   GLUCOSE, CAPILLARY     Status: Abnormal   Collection Time   07/04/12  9:20 PM      Component Value Range Comment   Glucose-Capillary 142 (*) 70 - 99 mg/dL   GLUCOSE, CAPILLARY     Status: Abnormal   Collection Time   07/05/12  7:30 AM      Component Value Range Comment   Glucose-Capillary 111 (*) 70 - 99 mg/dL    Comment 1 Notify RN     GLUCOSE, CAPILLARY     Status: Abnormal   Collection Time   07/05/12 11:59 AM       Component Value Range Comment   Glucose-Capillary 199 (*) 70 - 99 mg/dL    Comment 1 Notify RN     GLUCOSE, CAPILLARY     Status: Abnormal   Collection Time   07/05/12  4:51 PM      Component Value Range Comment   Glucose-Capillary 120 (*) 70 - 99 mg/dL    Comment 1 Notify RN     GLUCOSE, CAPILLARY     Status: Abnormal   Collection Time   07/05/12  9:19 PM      Component Value Range Comment   Glucose-Capillary 194 (*) 70 - 99 mg/dL      HEENT: poor dentition Cardio: RRR Resp: CTA B/L after clears with cough otherwise upper airway sounds GI: BS positive Extremity:  Edema mild L dorsum hand Skin:   Intact Neuro: Lethargic, Cranial Nerve Abnormalities R central 7, Abnormal Motor 1/5 L FF,2/5 L deltoid, biceps and triceps, Abnormal FMC Ataxic/ dec FMC and Tone  increased extensor tone in LUE, Tone:  Hypertonia and Dysarthric Musc/Skel:  Normal   Assessment/Plan: 1. 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 Physiatrist is providing close team supervision and 24 hour management of active medical problems listed below. Physiatrist and rehab team continue to assess barriers to discharge/monitor patient progress toward functional and medical goals. FIM: FIM - Bathing Bathing Steps Patient Completed: Chest;Left Arm;Right upper leg;Left upper leg Bathing: 2: Max-Patient completes 3-4 68f 10 parts or 25-49% (wife assisted with periare while cliniican stabilized  LEs )  FIM - Upper Body Dressing/Undressing Upper body dressing/undressing steps patient completed: Put head through opening of pull over shirt/dress;Thread/unthread right sleeve of pullover shirt/dresss Upper body dressing/undressing: 3: Mod-Patient completed 50-74% of tasks FIM - Lower Body Dressing/Undressing Lower body dressing/undressing steps patient completed: Thread/unthread right underwear  leg;Thread/unthread left underwear leg;Pull underwear up/down;Thread/unthread right pants leg;Thread/unthread left pants leg;Pull pants up/down;Don/Doff right sock;Don/Doff left sock Lower body dressing/undressing: 1: Two helpers  FIM - Toileting Toileting: 0: Activity did not occur  FIM - Archivist Transfers: 0-Activity did not occur  FIM - Banker Devices: Bed rails Bed/Chair Transfer: 3: Supine > Sit: Mod A (lifting assist/Pt. 50-74%/lift 2 legs;2: Bed > Chair or W/C: Max A (lift and lower assist)  FIM - Locomotion: Wheelchair Locomotion: Wheelchair: 0: Activity did not occur FIM - Locomotion: Ambulation Locomotion: Ambulation: 0: Activity did not occur  Comprehension Comprehension Mode: Auditory Comprehension: 5-Follows basic conversation/direction: With no assist  Expression Expression Mode: Verbal Expression: 3-Expresses basic 50 - 74% of the time/requires cueing 25 - 50% of the time. Needs to repeat parts of sentences.  Social Interaction Social Interaction: 5-Interacts appropriately 90% of the time - Needs monitoring or encouragement for participation or interaction.  Problem Solving Problem Solving: 2-Solves basic 25 - 49% of the time - needs direction more than half the time to initiate, plan or complete simple activities  Memory Memory: 4-Recognizes or recalls 75 - 89% of the time/requires cueing 10 - 24% of the time  Medical Problem List and Plan:  1. DVT Prophylaxis/Anticoagulation: Pharmaceutical: Lovenox  2. Pain Management: Prn ultram for headaches.  3. Mood: will need a lot of ego support as high levels of frustration noted. Continue Klonopin at bedtime. neuropsych assessment and recs would be helpful as well.  4. Neuropsych: This patient is capable of making decisions on his/her own behalf.  5. STEMI/Non-obstructiveCAD: Treated medically. Ace initiated for BP control.  6. Non-ischemic CM: HTN v/s CVA  related.  7. Borderline DM :Hgb A1C at 6.8. Continue CBG checks AC/HS with SSI. May need oral agent.  8. HTN: Will monitor with bid checks. Avoid hypotension to prevent hypoperfusion.  9. Dyslipidemia: continue lipitor.  10. Leucocytosis: likely reactive. Will check UA/UCS 11. Poor po intake enc 12.  Fluid overload will d/c IVF  LOS (Days) 5 A FACE TO FACE EVALUATION WAS PERFORMED  Malesha Suliman E 07/06/2012, 9:26 AM

## 2012-07-06 NOTE — Progress Notes (Signed)
Occupational Therapy Session Note  Patient Details  Name: Glenn Maldonado MRN: [id number] Date of Birth: [birth year]  Today's Date: 07/06/2012 Time: 4010-2725 Time Calculation (min): 60 min  Short Term Goals: Week 1:  OT Short Term Goal 1 (Week 1): Patient will increase grooming to Min Assist sitting at [employer]. OT Short Term Goal 2 (Week 1): Patient will increase UB dressing to Min Assist with hemi-dressing techniques. OT Short Term Goal 3 (Week 1): Patient will increase LB bathing to Mod Assist with long handled sponge. OT Short Term Goal 4 (Week 1): Patient will increase LB Dressing to Max Assist with hemi dressing techniques. OT Short Term Goal 5 (Week 1): Patient will increase toilet transfer to Max Assist with grab bar and elevated toilet seat.  Skilled Therapeutic Interventions/Progress Updates:  ADL re-training performed in shower this AM for the first time!! Patient's sitting balance has improved to Fair and was finally safe enough to take a shower. 3-in-1 commode seat used in shower to provide lateral support when needed. Patient did very well following directions, attending to task, and bathing all body parts that he could reach. Wife assist with washing of the buttocks while patient was standing. Patient is steadily making progress towards LTGs.   Therapy Documentation Precautions:  Precautions Precautions: Fall Precaution Comments: hemiplegia LLE, hypertonus LLE Restrictions Weight Bearing Restrictions: No Pain: Pain Assessment Pain Assessment: No/denies pain Pain Score: 0-No pain Faces Pain Scale: No hurt Pain Type: Acute pain Pain Location: Back Pain Orientation: Lower;Right Pain Intervention(s): Repositioned;Ambulation/increased activity  See FIM for current functional status  Therapy/Group: Individual Therapy  Limmie Patricia, OTR/L 07/06/2012, 12:30 PM

## 2012-07-06 NOTE — Progress Notes (Signed)
Physical Therapy Note  Patient Details  Name: Glenn Maldonado MRN: [id number] Date of Birth: [birth year] Today's Date: 07/06/2012  13:45-14:30 individual therapy pt denied pain  Performed squatting and standing activity to activate rt leg x 15 including squats with elbows on table and reaching to rt to acitvate rt hip   Julian Reil 07/06/2012, 5:00 PM

## 2012-07-06 NOTE — Progress Notes (Signed)
Speech Language Pathology Daily Session Note  Patient Details  Name: Glenn Maldonado MRN: [id number] Date of Birth: December 12, 1960  Today's Date: 07/06/2012 Time: 1130-1230 Time Calculation (min): 60 min  Short Term Goals: Week 1: SLP Short Term Goal 1 (Week 1): Patient will consume Dys.2 textures and honey-thick liquids with min assist verbal cues to utilize compensatory strategies which prevent overt s/s of aspiraiton SLP Short Term Goal 1 - Progress (Week 1): Progressing toward goal SLP Short Term Goal 2 (Week 1): Patient will sustain attention to basic, familiar task for 5 minutes with min assist verbal cues. SLP Short Term Goal 2 - Progress (Week 1): Progressing toward goal SLP Short Term Goal 3 (Week 1): Patient will perform oral motor and diaphragmatic breahting exercises with mod assist verbal and visual cues. SLP Short Term Goal 3 - Progress (Week 1): Progressing toward goal SLP Short Term Goal 4 (Week 1): Patient will utilize speech intelligibility strategies during conversation with min assist verbal cues  SLP Short Term Goal 5 (Week 1): Patient will request help as needed during completion of basic ADLs with min assist verbal/question cues.  Skilled Therapeutic Interventions: Treatment session focused on addressing dysphagia and cognition goals during a self-feeding task.  SLP facilitated session with supervision verbal cues to alternate attention between conversation and self-feeding task.  SLP also facilitated session with min assist verbal cues to utilize safe swallow compensatory strategies which prevented any overt s/s of aspiration during p.o. given Dys.1 and Dys.2 textures.  As a result it is recommended that his diet be upgraded to Dys.2 textures starting 07/07/12.  Patient with limited p.o. inatek during today's session and SLP suspects that a texture upgrade will also allow for more diet choices.     FIM:  Comprehension Comprehension Mode: Auditory Comprehension: 5-Follows basic  conversation/direction: With no assist Expression Expression Mode: Verbal Expression: 4-Expresses basic 75 - 89% of the time/requires cueing 10 - 24% of the time. Needs helper to occlude trach/needs to repeat words. Social Interaction Social Interaction: 6-Interacts appropriately with others with medication or extra time (anti-anxiety, antidepressant). Problem Solving Problem Solving: 3-Solves basic 50 - 74% of the time/requires cueing 25 - 49% of the time Memory Memory: 4-Recognizes or recalls 75 - 89% of the time/requires cueing 10 - 24% of the time FIM - Eating Eating Activity: 5: Set-up assist for open containers;5: Supervision/cues  Pain Pain Assessment Pain Assessment: No/denies pain Pain Score: 0-No pain  Therapy/Group: Individual Therapy  Charlane Ferretti., CCC-SLP [phone]  [name] 07/06/2012, 1:23 PM

## 2012-07-07 ENCOUNTER — Inpatient Hospital Stay (HOSPITAL_COMMUNITY): Payer: Medicaid - Out of State

## 2012-07-07 ENCOUNTER — Inpatient Hospital Stay (HOSPITAL_COMMUNITY): Payer: Medicaid - Out of State | Admitting: *Deleted

## 2012-07-07 ENCOUNTER — Inpatient Hospital Stay (HOSPITAL_COMMUNITY): Payer: Medicaid - Out of State | Admitting: Speech Pathology

## 2012-07-07 ENCOUNTER — Inpatient Hospital Stay (HOSPITAL_COMMUNITY): Payer: Medicaid - Out of State | Admitting: Occupational Therapy

## 2012-07-07 DIAGNOSIS — I6330 Cerebral infarction due to thrombosis of unspecified cerebral artery: Secondary | ICD-10-CM

## 2012-07-07 DIAGNOSIS — Z5189 Encounter for other specified aftercare: Secondary | ICD-10-CM

## 2012-07-07 DIAGNOSIS — I10 Essential (primary) hypertension: Secondary | ICD-10-CM

## 2012-07-07 LAB — GLUCOSE, CAPILLARY
Glucose-Capillary: 134 mg/dL — ABNORMAL HIGH (ref 70–99)
Glucose-Capillary: 108 mg/dL — ABNORMAL HIGH (ref 70–99)
Glucose-Capillary: 141 mg/dL — ABNORMAL HIGH (ref 70–99)

## 2012-07-07 MED ORDER — BACLOFEN 10 MG PO TABS
10.0000 mg | ORAL_TABLET | Freq: Every day | ORAL | Status: DC
  Administered 2012-07-07 – 2012-07-09 (×3): 10 mg via ORAL
  Filled 2012-07-07 (×5): qty 1

## 2012-07-07 MED ORDER — CLOTRIMAZOLE-BETAMETHASONE 1-0.05 % EX CREA
TOPICAL_CREAM | Freq: Two times a day (BID) | CUTANEOUS | Status: DC
  Administered 2012-07-08: 1 via TOPICAL
  Administered 2012-07-09 – 2012-07-21 (×23): via TOPICAL
  Filled 2012-07-07 (×3): qty 15

## 2012-07-07 NOTE — Progress Notes (Signed)
Occupational Therapy Session Note  Patient Details  Name: Glenn Maldonado MRN: [id number] Date of Birth: 1961-05-10  Today's Date: 07/07/2012 Time: 0900-1000 Time Calculation (min): 60 min  Short Term Goals: Week 1:  OT Short Term Goal 1 (Week 1): Patient will increase grooming to Min Assist sitting at [employer]. OT Short Term Goal 2 (Week 1): Patient will increase UB dressing to Min Assist with hemi-dressing techniques. OT Short Term Goal 3 (Week 1): Patient will increase LB bathing to Mod Assist with long handled sponge. OT Short Term Goal 4 (Week 1): Patient will increase LB Dressing to Max Assist with hemi dressing techniques. OT Short Term Goal 5 (Week 1): Patient will increase toilet transfer to Max Assist with grab bar and elevated toilet seat.  Skilled Therapeutic Interventions/Progress Updates:  ADL re-training performed in shower this AM. 3-in-1 commode seat used in shower to provide lateral support when needed. Session with focus on hemi-dressing technique, standing balance, sitting balance, functional transfers, bathing and dressing. Patient did very well following directions, attending to task, and bathing all body parts that he could reach. Patient increased functional performance during LB dressing with vc's for hemi-dressing technique. Patient is steadily making progress towards LTGs.   Therapy Documentation Precautions:  Precautions Precautions: Fall Precaution Comments: hemiplegia LLE, hypertonus LLE Restrictions Weight Bearing Restrictions: No Pain: Pain Assessment Pain Assessment: No/denies pain  See FIM for current functional status  Therapy/Group: Individual Therapy  Limmie Patricia, OTR/L 07/07/2012, 10:21 AM

## 2012-07-07 NOTE — Progress Notes (Signed)
Patient ID: Glenn Maldonado, male   DOB: 07/15/1961, 51 y.o.   MRN: [id number]  Subjective/Complaints: Walked in therapy, breathing well Spasms last noc Agitated yesterday.  Didn't get dinner on time Objective: Vital Signs: Blood pressure 156/68, pulse 90, temperature 97.5 F (36.4 C), temperature source Oral, resp. rate 18, height 5\' 10"  (1.778 m), weight 83.3 kg (183 lb 10.3 oz), SpO2 97.00%. No results found. Results for orders placed during the hospital encounter of 07/01/12 (from the past 72 hour(s))  GLUCOSE, CAPILLARY     Status: Abnormal   Collection Time   07/04/12 12:25 PM      Component Value Range Comment   Glucose-Capillary 171 (*) 70 - 99 mg/dL    Comment 1 Notify RN     GLUCOSE, CAPILLARY     Status: Abnormal   Collection Time   07/04/12  5:03 PM      Component Value Range Comment   Glucose-Capillary 139 (*) 70 - 99 mg/dL   GLUCOSE, CAPILLARY     Status: Abnormal   Collection Time   07/04/12  9:20 PM      Component Value Range Comment   Glucose-Capillary 142 (*) 70 - 99 mg/dL   GLUCOSE, CAPILLARY     Status: Abnormal   Collection Time   07/05/12  7:30 AM      Component Value Range Comment   Glucose-Capillary 111 (*) 70 - 99 mg/dL    Comment 1 Notify RN     GLUCOSE, CAPILLARY     Status: Abnormal   Collection Time   07/05/12 11:59 AM      Component Value Range Comment   Glucose-Capillary 199 (*) 70 - 99 mg/dL    Comment 1 Notify RN     GLUCOSE, CAPILLARY     Status: Abnormal   Collection Time   07/05/12  4:51 PM      Component Value Range Comment   Glucose-Capillary 120 (*) 70 - 99 mg/dL    Comment 1 Notify RN     GLUCOSE, CAPILLARY     Status: Abnormal   Collection Time   07/05/12  9:19 PM      Component Value Range Comment   Glucose-Capillary 194 (*) 70 - 99 mg/dL   BASIC METABOLIC PANEL     Status: Abnormal   Collection Time   07/06/12  9:00 AM      Component Value Range Comment   Sodium 140  135 - 145 mEq/L    Potassium 3.9  3.5 - 5.1 mEq/L    Chloride 101   96 - 112 mEq/L    CO2 31  19 - 32 mEq/L    Glucose, Bld 131 (*) 70 - 99 mg/dL    BUN 25 (*) 6 - 23 mg/dL    Creatinine, Ser 2.44  0.50 - 1.35 mg/dL    Calcium 9.7  8.4 - 01.0 mg/dL    GFR calc non Af Amer 71 (*) >90 mL/min    GFR calc Af Amer 82 (*) >90 mL/min   GLUCOSE, CAPILLARY     Status: Abnormal   Collection Time   07/06/12 12:39 PM      Component Value Range Comment   Glucose-Capillary 167 (*) 70 - 99 mg/dL   GLUCOSE, CAPILLARY     Status: Abnormal   Collection Time   07/06/12  4:29 PM      Component Value Range Comment   Glucose-Capillary 140 (*) 70 - 99 mg/dL    Comment 1 Notify RN  GLUCOSE, CAPILLARY     Status: Abnormal   Collection Time   07/06/12  8:53 PM      Component Value Range Comment   Glucose-Capillary 135 (*) 70 - 99 mg/dL    Comment 1 Notify RN     GLUCOSE, CAPILLARY     Status: Abnormal   Collection Time   07/07/12  7:21 AM      Component Value Range Comment   Glucose-Capillary 114 (*) 70 - 99 mg/dL    Comment 1 Notify RN        HEENT: poor dentition Cardio: RRR Resp: CTA B/L after clears with cough otherwise upper airway sounds GI: BS positive Extremity:  Edema mild L dorsum hand Skin:   Intact Neuro: Lethargic, Cranial Nerve Abnormalities R central 7, Abnormal Motor 1/5 L FF,2/5 L deltoid, biceps and triceps, Abnormal FMC Ataxic/ dec FMC and Tone  increased extensor tone in LUE, Tone:  Hypertonia and Dysarthric Musc/Skel:  Normal   Assessment/Plan: 1. 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 Physiatrist is providing close team supervision and 24 hour management of active medical problems listed below. Physiatrist and rehab team continue to assess barriers to discharge/monitor patient progress toward functional and medical goals. FIM: FIM - Bathing Bathing Steps Patient Completed: Chest;Right Arm;Left Arm;Abdomen;Front  perineal area;Buttocks;Right upper leg;Left upper leg;Right lower leg (including foot);Left lower leg (including foot) Bathing: 3: Mod-Patient completes 5-7 73f 10 parts or 50-74%  FIM - Upper Body Dressing/Undressing Upper body dressing/undressing steps patient completed: Thread/unthread right sleeve of pullover shirt/dresss;Thread/unthread left sleeve of pullover shirt/dress;Put head through opening of pull over shirt/dress;Pull shirt over trunk Upper body dressing/undressing: 4: Min-Patient completed 75 plus % of tasks FIM - Lower Body Dressing/Undressing Lower body dressing/undressing steps patient completed: Thread/unthread right pants leg;Thread/unthread left pants leg;Pull pants up/down;Don/Doff right sock;Don/Doff left sock;Don/Doff right shoe;Don/Doff left shoe;Fasten/unfasten right shoe;Fasten/unfasten left shoe Lower body dressing/undressing: 1: Total-Patient completed less than 25% of tasks  FIM - Toileting Toileting: 0: Activity did not occur  FIM - Archivist Transfers: 0-Activity did not occur  FIM - Banker Devices: Bed rails Bed/Chair Transfer: 3: Supine > Sit: Mod A (lifting assist/Pt. 50-74%/lift 2 legs;3: Bed > Chair or W/C: Mod A (lift or lower assist)  FIM - Locomotion: Wheelchair Distance: 100 Locomotion: Wheelchair: 2: Travels 50 - 149 ft with moderate assistance (Pt: 50 - 74%) FIM - Locomotion: Ambulation Locomotion: Ambulation: 0: Activity did not occur  Comprehension Comprehension Mode: Auditory Comprehension: 5-Follows basic conversation/direction: With no assist  Expression Expression Mode: Verbal Expression: 5-Expresses complex 90% of the time/cues < 10% of the time  Social Interaction Social Interaction: 6-Interacts appropriately with others with medication or extra time (anti-anxiety, antidepressant).  Problem Solving Problem Solving: 5-Solves complex 90% of the time/cues < 10% of the  time  Memory Memory: 5-Recognizes or recalls 90% of the time/requires cueing < 10% of the time  Medical Problem List and Plan:  1. DVT Prophylaxis/Anticoagulation: Pharmaceutical: Lovenox  2. Pain Management: Prn ultram for headaches.  3. Mood: will need a lot of ego support as high levels of frustration noted. Xanax prn neuropsych assessment and recs would be helpful as well.  4. Neuropsych: This patient is capable of making decisions on his/her own behalf.  5. STEMI/Non-obstructiveCAD: Treated medically. Ace initiated for BP control.  6. Non-ischemic CM: HTN v/s CVA related.  7. Borderline DM :Hgb  A1C at 6.8. Continue CBG checks AC/HS with SSI. May need oral agent.  8. HTN: Will monitor with bid checks. Avoid hypotension to prevent hypoperfusion.  9. Dyslipidemia: continue lipitor.  10. Leucocytosis:  UA/UCS -, afeb monitor 11. Poor po intake enc  LOS (Days) 6 A FACE TO FACE EVALUATION WAS PERFORMED  Blayke Cordrey E 07/07/2012, 7:49 AM

## 2012-07-07 NOTE — Progress Notes (Signed)
Physical Therapy Session Note  Patient Details  Name: Glenn Maldonado MRN: [id number] Date of Birth: 1961/01/19  Today's Date: 07/07/2012 Time: 4098-1191 Time Calculation (min): 43 min  Short Term Goals: Week 1:  PT Short Term Goal 1 (Week 1): Pt will roll R with min assist. PT Short Term Goal 2 (Week 1): Pt will transfer bed>< w/c with mod assist. PT Short Term Goal 3 (Week 1): Pt will stand x 3 minutes during functional activity. PT Short Term Goal 4 (Week 1): Pt will propel w/c x 50' with min assist.  Skilled Therapeutic Interventions/Progress Updates:   AM-  W/c mobility x 50' with min assist using hemi technique; occasional cues for L attention.  Co-treatment with Rec therapist for +2 skilled therapeutic activities.  Sit> stand at heavy table with mod assist.  Stood with L UE supported, focusing on upright stance without leaning onto table, x 5 minutes while performing visual perceptual task of constructing PVC pipe sculpture.  Mod assist for balance overall.  Pt was skillful at task, requiring only 1 VC for accuracy for moderately difficult sculpture.  Gait training with LPFRW x 28' with ACe on L foot to control foot drop, +2 assist for RW control and balance.  Focused on sequencing, L stance phase with knee control, upright trunk and forward.  PM-  neuromuscular re-education during bed mobility for trunk activation: rolling L and R with supervision, VCs.  neuromuscular re-education for isolated LLE movement out of synergy: 5 x 1 bil bridging focusing on L hip rotational control.  L sidelying> sitting with mod assist for trunk elevation.  Bed> w/c stand/pivot transfer to R with mod assist.  neuromuscular re-education in sitting for trunk activation for shortening/lengthening to facilitate righting reactions.  Re-ed via demo, tactile and VCs.  Standing at rolling table x 10 min for R hand task requiring reaching across midline repeatedly, mod assist for balance.  Gait training  with EVA walker, x 24' , +2 and ACE as above.     Therapy Documentation Precautions:  Precautions Precautions: Fall Precaution Comments: hemiplegia LLE, hypertonus LLE Restrictions Weight Bearing Restrictions: No  Pain: Pain Assessment Pain Assessment: No/denies pain    Locomotion : Ambulation Ambulation/Gait Assistance: 1: +2 Total assist         See FIM for current functional status  Therapy/Group: Individual Therapy  Breean Nannini 07/07/2012, 5:26 PM

## 2012-07-07 NOTE — Progress Notes (Signed)
Occupational Therapy Session Note  Patient Details  Name: Glenn Maldonado MRN: [id number] Date of Birth: [birth year]  Today's Date: 07/07/2012 Time: 1130-1200 Time Calculation (min): 30 min  Short Term Goals: Week 1:  OT Short Term Goal 1 (Week 1): Patient will increase grooming to Min Assist sitting at [employer]. OT Short Term Goal 2 (Week 1): Patient will increase UB dressing to Min Assist with hemi-dressing techniques. OT Short Term Goal 3 (Week 1): Patient will increase LB bathing to Mod Assist with long handled sponge. OT Short Term Goal 4 (Week 1): Patient will increase LB Dressing to Max Assist with hemi dressing techniques. OT Short Term Goal 5 (Week 1): Patient will increase toilet transfer to Max Assist with grab bar and elevated toilet seat.  Skilled Therapeutic Interventions/Progress Updates:    [employer] group: focus on self feeding while managing safe swallowing techniques with questioning cues to check for oral pocketing on the left, use of left UE as stabilizer, and cues for pacing self and for bite size; min A for selective attention in a moderate distracting environment.  Therapy Documentation Precautions:  Precautions Precautions: Fall Precaution Comments: hemiplegia LLE, hypertonus LLE Restrictions Weight Bearing Restrictions: No   Pain: Pain Assessment Pain Assessment: No/denies pain  See FIM for current functional status  Therapy/Group: Group Therapy  [name] [facility] 07/07/2012, 3:35 PM

## 2012-07-07 NOTE — Consult Note (Signed)
NEUROCOGNITIVE TESTING - [facility]   [name] is a 51 year old, right-handed, married, Caucasian man who was seen for brief neuropsychological evaluation to assess cognitive functioning post stroke.  According to his medical record, [name] was admitted on 06/28/2012 with complaints of chest pressure and difficulty walking as well as left arm heaviness with tingling.  Despite initiation of treatments, he developed facial droop with dysarthria and worsening left sided weakness.  MRI of his brain revealed acute infarcts in the posterior limb of the right internal carotid artery and bilateral parietal white matter.    PROCEDURES: [3 units of 54098 on 07/06/12]  The following tests were performed during today's visit: Repeatable Battery for the Assessment of Neuropsychological Status (RBANS, form A), Beck Anxiety Inventory and Beck Depression Inventory-II.    Test results are as follows:   RBANS Indices Scaled Score Percentile Description  Immediate Memory  87 19 Below Average  Visuospatial/Constructional 92 30 Average  Language 97 42 Average  Attention 88 21 Below Average  Delayed Memory 108 70 Average  Total Score 92 30 Average    RBANS Subtests Raw Score Percentile Description  List Learning 27 45 Average  Story Memory 14 18 Below Average  Figure Copy 17 19 Below Average  Line Orientation 17 58 Average  Picture Naming 10 75 Average  Semantic Fluency 20 42 Average  Digit Span 10 39 Average  Coding 35 10 Below Average  List Recall 7 68 Average  List Recognition 19 32 Average  Story Recall 7 16 Below Average  Figure recall 19 95 Superior   Beck Depression Inventory-II Raw Score = 7 Description = WNL   Beck Anxiety Inventory Raw Score = 12 Description = Mild   Test results revealed predominantly intact scores.  There were no areas of impairment, relative to his same age peers, but [name] demonstrated weaknesses in memory for  contextually-related information and in complex attention.  This suggests that he may be having difficulty filtering out distractions in order to attend to important concepts in conversation and when being given instructions.  Notably, his memory for rote verbal and visual information was within normal limits.  He was also within normal limits in his simple attention and language skills (both confrontation naming and semantic fluency).    [name] responses to self-report measures of mood symptoms were suggestive of mild anxiety, but were not indicative of significant depression at this time.  Notably, [name] described significant worry over inability to perform sexually.  He described feelings of inadequacy and worry that this would drive his wife to leave him.  He did not appear to respond to reassurance from his wife in this regard.  He also disclosed significant worry over financial issues.  Although he definitely could be demonstrating cognitive weaknesses secondary to stroke, his cognitive difficulties are likely also being exacerbated by, if not caused by, significant anxiety as described above.  It is expected that if he is able to learn better coping strategies to manage his stressors, that he will notice improvement in cognitive functioning as well.    In light of these findings, the following recommendations are provided.   RECOMMENDATIONS:  Recommendations for treatment team:    When interacting with [name], directions and information should be provided in a simple, straight forward manner, and the treatment team should avoid giving multiple instructions simultaneously.    [name] may also benefit from being provided with multiple trials to learn new skills  given the noted memory inefficiencies.    Since emotional factors are likely adversely impacting the patient's daily life, brief counseling for social support seems warranted during this hospitalization. His physicians  may also consider whether initiation of an anti-anxiety medication seems medically indicated in this case to help improve mood.     To the extent possible, multitasking should be avoided.   Performance will generally be best in a structured, routine, and familiar environment, as opposed to situations involving complex problems.   Recommendations for discharge planning:    Engage in individual psychotherapy in order to help him learn more effective coping strategies to deal with life stressors.     Complete a comprehensive neuropsychological evaluation as an outpatient in 8-12 months to assess for interval change.   Maintain engagement in mentally, physically and cognitively stimulating activities.    Strive to maintain a healthy lifestyle (e.g., proper diet and exercise) in order to promote physical, cognitive and emotional health.   Leavy Cella, Psy.D.  Clinical Neuropsychologist

## 2012-07-07 NOTE — Progress Notes (Signed)
Speech Language Pathology Daily Session Note  Patient Details  Name: Glenn Maldonado MRN: [id number] Date of Birth: January 21, 1961  Today's Date: 07/07/2012 Time: 1435-1500 Time Calculation (min): 25 min  Short Term Goals: Week 1: SLP Short Term Goal 1 (Week 1): Patient will consume Dys.2 textures and honey-thick liquids with min assist verbal cues to utilize compensatory strategies which prevent overt s/s of aspiraiton SLP Short Term Goal 1 - Progress (Week 1): Progressing toward goal SLP Short Term Goal 2 (Week 1): Patient will sustain attention to basic, familiar task for 5 minutes with min assist verbal cues. SLP Short Term Goal 2 - Progress (Week 1): Progressing toward goal SLP Short Term Goal 3 (Week 1): Patient will perform oral motor and diaphragmatic breahting exercises with mod assist verbal and visual cues. SLP Short Term Goal 3 - Progress (Week 1): Progressing toward goal SLP Short Term Goal 4 (Week 1): Patient will utilize speech intelligibility strategies during conversation with min assist verbal cues  SLP Short Term Goal 5 (Week 1): Patient will request help as needed during completion of basic ADLs with min assist verbal/question cues.  Skilled Therapeutic Interventions: Treatment session focused on addressing dysphagia goals.  SLP facilitated oral care with set up and supervision verbal cues to maintain head down posture.  Patient then consumed water via cup with small sips with min assist verbal cues for pacing with weak cough response in 50% of opportunities.  SLP cued patient to utilize strategies for hard effortful with mod assist.     FIM:  Comprehension Comprehension Mode: Auditory Comprehension: 5-Follows basic conversation/direction: With no assist Expression Expression Mode: Verbal Expression: 4-Expresses basic 75 - 89% of the time/requires cueing 10 - 24% of the time. Needs helper to occlude trach/needs to repeat words. Social Interaction Social Interaction:  6-Interacts appropriately with others with medication or extra time (anti-anxiety, antidepressant). Problem Solving Problem Solving: 3-Solves basic 50 - 74% of the time/requires cueing 25 - 49% of the time Memory Memory: 4-Recognizes or recalls 75 - 89% of the time/requires cueing 10 - 24% of the time FIM - Eating Eating Activity: 5: Set-up assist for open containers;5: Supervision/cues  Pain Pain Assessment Pain Assessment: No/denies pain  Therapy/Group: Individual Therapy  Charlane Ferretti., CCC-SLP [phone]  Glenn Maldonado 07/07/2012, 4:32 PM

## 2012-07-07 NOTE — Progress Notes (Signed)
Speech Language Pathology Daily Session Note  Patient Details  Name: Glenn Maldonado MRN: [id number] Date of Birth: [birth year]  Today's Date: 07/07/2012 Time: 1200-1225 Time Calculation (min): 25 min  Short Term Goals: Week 1: SLP Short Term Goal 1 (Week 1): Patient will consume Dys.2 textures and honey-thick liquids with min assist verbal cues to utilize compensatory strategies which prevent overt s/s of aspiraiton SLP Short Term Goal 1 - Progress (Week 1): Progressing toward goal SLP Short Term Goal 2 (Week 1): Patient will sustain attention to basic, familiar task for 5 minutes with min assist verbal cues. SLP Short Term Goal 2 - Progress (Week 1): Progressing toward goal SLP Short Term Goal 3 (Week 1): Patient will perform oral motor and diaphragmatic breahting exercises with mod assist verbal and visual cues. SLP Short Term Goal 3 - Progress (Week 1): Progressing toward goal SLP Short Term Goal 4 (Week 1): Patient will utilize speech intelligibility strategies during conversation with min assist verbal cues  SLP Short Term Goal 5 (Week 1): Patient will request help as needed during completion of basic ADLs with min assist verbal/question cues.  Skilled Therapeutic Interventions: Co-treatment with OT; SLP facilitated session by targeting dysphagia goals during a self-feeding task.  SLP facilitated session with min assist verbal cues to recall and utilize safe swallow compensatory strategies while consuming Dys.2 textures and honey-thick liquids via cup sip.  SLP's cues to consume small bites/sips, clear throat, check for left pocketing and swallow an extra time prevented any overt s/s of aspiration during p.o. intake.  Patient with improved self-monitoring and correcting of wet vocal quality during today's session.    FIM:  Comprehension Comprehension Mode: Auditory Comprehension: 5-Follows basic conversation/direction: With no assist Expression Expression Mode: Verbal Expression:  4-Expresses basic 75 - 89% of the time/requires cueing 10 - 24% of the time. Needs helper to occlude trach/needs to repeat words. Social Interaction Social Interaction: 6-Interacts appropriately with others with medication or extra time (anti-anxiety, antidepressant). Problem Solving Problem Solving: 3-Solves basic 50 - 74% of the time/requires cueing 25 - 49% of the time Memory Memory: 4-Recognizes or recalls 75 - 89% of the time/requires cueing 10 - 24% of the time FIM - Eating Eating Activity: 5: Set-up assist for open containers;5: Supervision/cues  Pain Pain Assessment Pain Assessment: No/denies pain  Therapy/Group: Group Therapy  Charlane Ferretti., CCC-SLP [phone]  Glenn Maldonado 07/07/2012, 1:19 PM

## 2012-07-07 NOTE — Progress Notes (Signed)
Recreational Therapy Session Note  Patient Details  Name: Jaramie Bastos MRN: [id number] Date of Birth: July 25, 1961 Today's Date: 07/07/2012 Time:  09-1129 Pain: no c/o Skilled Therapeutic Interventions/Progress Updates: Pt stood EOT working on pipe tree with focus on left knee control, activity tolerance, standing balance, attention to the left.  Ambulated with PFRW ~28 feet +2 for safety walker control  Therapy/Group: Co-Treatment Level of assist: Mod Assist  Demitrious Mccannon 07/07/2012, 4:15 PM

## 2012-07-08 ENCOUNTER — Inpatient Hospital Stay (HOSPITAL_COMMUNITY): Payer: Medicaid - Out of State | Admitting: Occupational Therapy

## 2012-07-08 ENCOUNTER — Inpatient Hospital Stay (HOSPITAL_COMMUNITY): Payer: Medicaid - Out of State

## 2012-07-08 ENCOUNTER — Inpatient Hospital Stay (HOSPITAL_COMMUNITY): Payer: Medicaid - Out of State | Admitting: Speech Pathology

## 2012-07-08 ENCOUNTER — Inpatient Hospital Stay (HOSPITAL_COMMUNITY): Payer: Medicaid - Out of State | Admitting: Physical Therapy

## 2012-07-08 LAB — BASIC METABOLIC PANEL
BUN: 23 mg/dL (ref 6–23)
CO2: 31 mEq/L (ref 19–32)
Calcium: 9.4 mg/dL (ref 8.4–10.5)
Creatinine, Ser: 1.06 mg/dL (ref 0.50–1.35)

## 2012-07-08 LAB — CBC WITH DIFFERENTIAL/PLATELET
Eosinophils Relative: 4 % (ref 0–5)
HCT: 44.3 % (ref 39.0–52.0)
Hemoglobin: 14.9 g/dL (ref 13.0–17.0)
Lymphocytes Relative: 25 % (ref 12–46)
Lymphs Abs: 3 10*3/uL (ref 0.7–4.0)
MCV: 84.7 fL (ref 78.0–100.0)
Monocytes Absolute: 0.8 10*3/uL (ref 0.1–1.0)
Monocytes Relative: 7 % (ref 3–12)
Neutro Abs: 7.7 10*3/uL (ref 1.7–7.7)
RBC: 5.23 MIL/uL (ref 4.22–5.81)
RDW: 13.3 % (ref 11.5–15.5)
WBC: 12 10*3/uL — ABNORMAL HIGH (ref 4.0–10.5)

## 2012-07-08 LAB — GLUCOSE, CAPILLARY
Glucose-Capillary: 124 mg/dL — ABNORMAL HIGH (ref 70–99)
Glucose-Capillary: 142 mg/dL — ABNORMAL HIGH (ref 70–99)
Glucose-Capillary: 120 mg/dL — ABNORMAL HIGH (ref 70–99)
Glucose-Capillary: 88 mg/dL (ref 70–99)

## 2012-07-08 NOTE — Progress Notes (Signed)
Speech Language Pathology Daily Session Note  Patient Details  Name: Glenn Maldonado MRN: [id number] Date of Birth: November 24, 1960  Today's Date: 07/08/2012 Time: 1430-1500 Time Calculation (min): 30 min  Short Term Goals: Week 1: SLP Short Term Goal 1 (Week 1): Patient will consume Dys.2 textures and honey-thick liquids with min assist verbal cues to utilize compensatory strategies which prevent overt s/s of aspiraiton SLP Short Term Goal 1 - Progress (Week 1): Progressing toward goal SLP Short Term Goal 2 (Week 1): Patient will sustain attention to basic, familiar task for 5 minutes with min assist verbal cues. SLP Short Term Goal 2 - Progress (Week 1): Progressing toward goal SLP Short Term Goal 3 (Week 1): Patient will perform oral motor and diaphragmatic breahting exercises with mod assist verbal and visual cues. SLP Short Term Goal 3 - Progress (Week 1): Progressing toward goal SLP Short Term Goal 4 (Week 1): Patient will utilize speech intelligibility strategies during conversation with min assist verbal cues  SLP Short Term Goal 5 (Week 1): Patient will request help as needed during completion of basic ADLs with min assist verbal/question cues.  Skilled Therapeutic Interventions: Treatment focus on diaphragmatic breathing, speech intelligibility and intellectual awareness. Pt unable to identify cognitive changes since his stroke and required Mod verbal and semantic cues to recall current goals/focus on SLP intervention. Pt 100% intelligible during functional conversation but required supervision verbal cues to utilize over articulation at the conversation level. Pt also required Mod verbal, visual and demonstration cues to utilize diaphragmatic breathing at the sentence level to increase overall breath support and intelligibility.    FIM:  Comprehension Comprehension Mode: Auditory Comprehension: 4-Understands basic 75 - 89% of the time/requires cueing 10 - 24% of the  time Expression Expression Mode: Verbal Expression: 5-Expresses basic 90% of the time/requires cueing < 10% of the time. Social Interaction Social Interaction: 4-Interacts appropriately 75 - 89% of the time - Needs redirection for appropriate language or to initiate interaction. Problem Solving Problem Solving: 3-Solves basic 50 - 74% of the time/requires cueing 25 - 49% of the time Memory Memory: 3-Recognizes or recalls 50 - 74% of the time/requires cueing 25 - 49% of the time FIM - Eating Eating Activity: 5: Supervision/cues;5: Set-up assist for open containers  Pain No/Denies Pain   Therapy/Group: Individual Therapy  Mayte Diers 07/08/2012, 4:03 PM

## 2012-07-08 NOTE — Progress Notes (Signed)
Speech Language Pathology Daily Session Note  Patient Details  Name: Glenn Maldonado MRN: [id number] Date of Birth: [birth year]  Today's Date: 07/08/2012 Time: 1200-1210 Time Calculation (min): 10 min  Short Term Goals: Week 1: SLP Short Term Goal 1 (Week 1): Patient will consume Dys.2 textures and honey-thick liquids with min assist verbal cues to utilize compensatory strategies which prevent overt s/s of aspiraiton SLP Short Term Goal 1 - Progress (Week 1): Progressing toward goal SLP Short Term Goal 2 (Week 1): Patient will sustain attention to basic, familiar task for 5 minutes with min assist verbal cues. SLP Short Term Goal 2 - Progress (Week 1): Progressing toward goal SLP Short Term Goal 3 (Week 1): Patient will perform oral motor and diaphragmatic breahting exercises with mod assist verbal and visual cues. SLP Short Term Goal 3 - Progress (Week 1): Progressing toward goal SLP Short Term Goal 4 (Week 1): Patient will utilize speech intelligibility strategies during conversation with min assist verbal cues  SLP Short Term Goal 5 (Week 1): Patient will request help as needed during completion of basic ADLs with min assist verbal/question cues.  Skilled Therapeutic Interventions: Co-treatment session with OT; SLP facilitated session with min assist verbal cues to recall and utilize safe swallow compensatory strategies while consuming Dys.2 textures and honey-thick liquids.  Patient demonstrated improvement in monitoring and clearing wet vocal quality.  Patient exhibited coughing episode at end of session and he was able to orally expectorate secreations which then reduced wet vocal quality.     FIM:  Comprehension Comprehension Mode: Auditory Comprehension: 4-Understands basic 75 - 89% of the time/requires cueing 10 - 24% of the time Expression Expression Mode: Verbal Expression: 5-Expresses basic 90% of the time/requires cueing < 10% of the time. Social Interaction Social Interaction:  4-Interacts appropriately 75 - 89% of the time - Needs redirection for appropriate language or to initiate interaction. Problem Solving Problem Solving: 3-Solves basic 50 - 74% of the time/requires cueing 25 - 49% of the time Memory Memory: 3-Recognizes or recalls 50 - 74% of the time/requires cueing 25 - 49% of the time FIM - Eating Eating Activity: 5: Supervision/cues;5: Set-up assist for open containers  Pain  0  Therapy/Group: Group Therapy  Charlane Ferretti., CCC-SLP [phone]  Syon Tews 07/08/2012, 4:03 PM

## 2012-07-08 NOTE — Patient Care Conference (Signed)
Inpatient RehabilitationTeam Conference Note Date: 07/08/2012   Time: 11:20 AM    Patient Name: Glenn Maldonado      Medical Record Number: [id number]  Date of Birth: [birth year] Sex: Male         Room/Bed: 4031/4031-01 Payor Info: Payor: MEDICAID PENDING  Plan: MEDICAID PENDING  Product Type: *No Product type*     Admitting Diagnosis: RT CVA  Admit Date/Time:  07/01/2012  4:03 PM Admission Comments: No comment available   Primary Diagnosis:  CVA (cerebral infarction) Principal Problem: CVA (cerebral infarction)  Patient Active Problem List   Diagnosis Date Noted  . CVA (cerebral infarction) 06/29/2012  . 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 HTN (hypertension), uncontrolled 06/28/2012  . Leg weakness, bilateral 06/28/2012  . Tobacco abuse 06/28/2012  . Weight loss, non-intentional 06/28/2012  . OSA (obstructive sleep apnea), history of, but with recent weight loss less symptoms 06/28/2012  . NICM, EF 35% at cardiac cath, 55% by echo 06/28/2012    Expected Discharge Date: Expected Discharge Date: 07/22/12  Team Members Present: Physician: Dr. Claudette Laws Nurse Present: Other (comment) Josephina Gip) PT Present: Edman Circle, Judith Blonder, PTA OT Present: Leonette Monarch, Felipa Eth, OT SLP Present: Fae Pippin, SLP Other (Discipline and Name): Charolette Child Coordinator     Current Status/Progress Goal Weekly Team Focus  Medical   Left hemiparesis, left neglect appears to be improving, severe swallowing difficulties  Reassess swallowing  MBS this week   Bowel/Bladder   continent bowel and bladder, helper assist c urinal, lbm 9/2  remain continent   monitor patient and toilet q2hours   Swallow/Nutrition/ Hydration   Dys.2 textures and Honey-thick liquids   least restrictive p.o.  repeat objective study 9/7   ADL's   Patient is currently Min Assist for grooming/UB dressing; Mod Assist for bathing/LB  dressing/functional transfers.  Min Assist overall  family education, ADL performance, functional transfers, A/ROM Left UE, standing balance, sitting balance.   Mobility   max assist stand pivot trasnfers and gait , superivison wc, sitting balance min assist.  min assist overall  gait, safety awareness   Communication         mild dysarthria not being addressed at this time    Safety/Cognition/ Behavioral Observations  mod assist   min assist   increase self-monitoring and correcting with organizing and sequencing tasks   Pain   complaints of pain/spasms in ble and lower back  3 or less 1-10 scale  monitor for effectiveness of prn and scheduled pain medicines    Skin   no breakdown  no breakdown   no breakdown       *See Interdisciplinary Assessment and Plan and progress notes for long and short-term goals  Barriers to Discharge: Limited financial resources    Possible Resolutions to Barriers:  Wife applying for [place] state disability    Discharge Planning/Teaching Needs:  Home with wife who is here daily and learning his care.  Working on [employer] Discussion:  No signs of CHF-some return and progress made.  Fatigues easily, MBS 9/6. Meds for spasms are helping. Wife is here daily to attend therapies with pt.  Revisions to Treatment Plan:  None   Continued Need for Acute Rehabilitation Level of Care: The patient requires daily medical management by a physician with specialized training in physical medicine and rehabilitation for the following conditions: Daily direction of a  multidisciplinary physical rehabilitation program to ensure safe treatment while eliciting the highest outcome that is of practical value to the patient.: Yes Daily medical management of patient stability for increased activity during participation in an intensive rehabilitation regime.: Yes Daily analysis of laboratory values and/or radiology reports with any subsequent need  for medication adjustment of medical intervention for : Neurological problems  Tanashia Ciesla, Lemar Livings 07/08/2012, 3:45 PM

## 2012-07-08 NOTE — Progress Notes (Signed)
Physical Therapy Weekly Progress Note  Patient Details  Name: Glenn Maldonado MRN: [id number] Date of Birth: 04/03/1961  Today's Date: 07/08/2012 Time: 0830-0930 Time Calculation (min): 60 min  Patient has met 4 of 4 short term goals.  Pt currently requires mod assist for squat tranfers or stand pivot transfer to the strong side. Pt can ambulate short distances with PFRW  Max assist and supervision for wc mobility. Pt has poor muscular endurance and some inattention to left.  Patient continues to demonstrate the following deficits:  Weakness, balance, awareness, activity tolerance, and therefore will continue to benefit from skilled PT intervention to enhance overall performance with activity tolerance, balance, postural control, attention and awareness.  Patient progressing toward long term goals..  Continue plan of care.  PT Short Term Goals Week 1:  PT Short Term Goal 1 (Week 1): Pt will roll R with min assist. PT Short Term Goal 1 - Progress (Week 1): Met PT Short Term Goal 2 (Week 1): Pt will transfer bed>< w/c with mod assist. PT Short Term Goal 2 - Progress (Week 1): Met PT Short Term Goal 3 (Week 1): Pt will stand x 3 minutes during functional activity. PT Short Term Goal 3 - Progress (Week 1): Met PT Short Term Goal 4 (Week 1): Pt will propel w/c x 50' with min assist. PT Short Term Goal 4 - Progress (Week 1): Met  Skilled Therapeutic Interventions/Progress Updates:      Therapy Documentation Precautions:  Precautions Precautions: Fall Precaution Comments: hemiplegia LLE, hypertonus LLE Restrictions Weight Bearing Restrictions: No   Vital Signs: Oxygen Therapy SpO2: 94 % O2 Device: None (Room air) Pain: Pain Assessment Pain Assessment: 0-10 Pain Score:   1 Pain Type: Acute pain Pain Location: Back Pain Orientation: Lower Pain Descriptors: Aching Pain Frequency: Intermittent Pain Onset: Gradual Pain Intervention(s): Repositioned;Massage Mobility stand pivot  transfer to left max assist after session.       Trunk/Postural Assessment  Static sitting:  supervision          Other Treatments:  performed high kneel reaching, hip rotation and extension, or improved functional strength.  See FIM for current functional status  Therapy/Group: Individual Therapy  Julian Reil 07/08/2012, 11:23 AM

## 2012-07-08 NOTE — Progress Notes (Signed)
Occupational Therapy Session Note  Patient Details  Name: Glenn Maldonado MRN: [id number] Date of Birth: 09/06/1961  Today's Date: 07/08/2012 Time: 1130-1200 Time Calculation (min): 30 min   Skilled Therapeutic Interventions/Progress Updates: Patient participated in [employer] today.  Discussed goals of therapy and progress with his rehab program.  Patient able to safely pace self with feeding strategies, requiring minimal cueing intermittently for clearing throat, hard cough.  Patient indicated having no movement in left arm, but actually demonstrates active shoulder flexion / extension, elbow flexion / extension, wrist flexion with minimal facilitation.  Patient very social with members of the group.      Therapy Documentation Precautions:  Precautions Precautions: Fall Precaution Comments: hemiplegia LLE, hypertonus LLE Restrictions Weight Bearing Restrictions: No  Pain: Uncomfortable sitting position, repositioned.    See FIM for current functional status  Therapy/Group: Group Therapy  [name] 07/08/2012, 2:42 PM

## 2012-07-08 NOTE — Progress Notes (Signed)
Patient ID: Glenn Maldonado, male   DOB: 1961/03/04, 51 y.o.   MRN: [id number]  Subjective/Complaints: Walked in therapy, "Restless legs" improved  Objective: Vital Signs: Blood pressure 175/99, pulse 73, temperature 97.3 F (36.3 C), temperature source Oral, resp. rate 18, height 5\' 10"  (1.778 m), weight 83.3 kg (183 lb 10.3 oz), SpO2 94.00%. No results found. Results for orders placed during the hospital encounter of 07/01/12 (from the past 72 hour(s))  GLUCOSE, CAPILLARY     Status: Abnormal   Collection Time   07/05/12 11:59 AM      Component Value Range Comment   Glucose-Capillary 199 (*) 70 - 99 mg/dL    Comment 1 Notify RN     GLUCOSE, CAPILLARY     Status: Abnormal   Collection Time   07/05/12  4:51 PM      Component Value Range Comment   Glucose-Capillary 120 (*) 70 - 99 mg/dL    Comment 1 Notify RN     GLUCOSE, CAPILLARY     Status: Abnormal   Collection Time   07/05/12  9:19 PM      Component Value Range Comment   Glucose-Capillary 194 (*) 70 - 99 mg/dL   BASIC METABOLIC PANEL     Status: Abnormal   Collection Time   07/06/12  9:00 AM      Component Value Range Comment   Sodium 140  135 - 145 mEq/L    Potassium 3.9  3.5 - 5.1 mEq/L    Chloride 101  96 - 112 mEq/L    CO2 31  19 - 32 mEq/L    Glucose, Bld 131 (*) 70 - 99 mg/dL    BUN 25 (*) 6 - 23 mg/dL    Creatinine, Ser 7.82  0.50 - 1.35 mg/dL    Calcium 9.7  8.4 - 95.6 mg/dL    GFR calc non Af Amer 71 (*) >90 mL/min    GFR calc Af Amer 82 (*) >90 mL/min   GLUCOSE, CAPILLARY     Status: Abnormal   Collection Time   07/06/12 12:39 PM      Component Value Range Comment   Glucose-Capillary 167 (*) 70 - 99 mg/dL   GLUCOSE, CAPILLARY     Status: Abnormal   Collection Time   07/06/12  4:29 PM      Component Value Range Comment   Glucose-Capillary 140 (*) 70 - 99 mg/dL    Comment 1 Notify RN     GLUCOSE, CAPILLARY     Status: Abnormal   Collection Time   07/06/12  8:53 PM      Component Value Range Comment   Glucose-Capillary 135 (*) 70 - 99 mg/dL    Comment 1 Notify RN     GLUCOSE, CAPILLARY     Status: Abnormal   Collection Time   07/07/12  7:21 AM      Component Value Range Comment   Glucose-Capillary 114 (*) 70 - 99 mg/dL    Comment 1 Notify RN     GLUCOSE, CAPILLARY     Status: Abnormal   Collection Time   07/07/12 11:45 AM      Component Value Range Comment   Glucose-Capillary 134 (*) 70 - 99 mg/dL    Comment 1 Notify RN     GLUCOSE, CAPILLARY     Status: Abnormal   Collection Time   07/07/12  4:36 PM      Component Value Range Comment   Glucose-Capillary 108 (*) 70 - 99  mg/dL    Comment 1 Notify RN     GLUCOSE, CAPILLARY     Status: Abnormal   Collection Time   07/07/12  9:00 PM      Component Value Range Comment   Glucose-Capillary 141 (*) 70 - 99 mg/dL    Comment 1 Notify RN     BASIC METABOLIC PANEL     Status: Abnormal   Collection Time   07/08/12 12:11 AM      Component Value Range Comment   Sodium 143  135 - 145 mEq/L    Potassium 4.2  3.5 - 5.1 mEq/L    Chloride 105  96 - 112 mEq/L    CO2 31  19 - 32 mEq/L    Glucose, Bld 119 (*) 70 - 99 mg/dL    BUN 23  6 - 23 mg/dL    Creatinine, Ser 1.61  0.50 - 1.35 mg/dL    Calcium 9.4  8.4 - 09.6 mg/dL    GFR calc non Af Amer 79 (*) >90 mL/min    GFR calc Af Amer >90  >90 mL/min      HEENT: poor dentition Cardio: RRR Resp: CTA B/L after clears with cough otherwise upper airway sounds GI: BS positive Extremity:  Edema mild L dorsum hand Skin:   Intact Neuro: Lethargic, Cranial Nerve Abnormalities R central 7, Abnormal Motor 1/5 L FF,2/5 L deltoid, biceps and triceps, Abnormal FMC Ataxic/ dec FMC and Tone  increased extensor tone in LUE, Tone:  Hypertonia and Dysarthric Musc/Skel:  Normal   Assessment/Plan: 1. 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 Physiatrist is providing close team supervision and  24 hour management of active medical problems listed below. Physiatrist and rehab team continue to assess barriers to discharge/monitor patient progress toward functional and medical goals. FIM: FIM - Bathing Bathing Steps Patient Completed: Chest;Right Arm;Left Arm;Abdomen;Front perineal area;Buttocks;Right upper leg;Left upper leg;Right lower leg (including foot);Left lower leg (including foot) Bathing: 3: Mod-Patient completes 5-7 63f 10 parts or 50-74%  FIM - Upper Body Dressing/Undressing Upper body dressing/undressing steps patient completed: Thread/unthread right sleeve of pullover shirt/dresss;Thread/unthread left sleeve of pullover shirt/dress;Put head through opening of pull over shirt/dress;Pull shirt over trunk Upper body dressing/undressing: 4: Min-Patient completed 75 plus % of tasks FIM - Lower Body Dressing/Undressing Lower body dressing/undressing steps patient completed: Thread/unthread right pants leg;Thread/unthread left pants leg;Pull pants up/down;Don/Doff right sock;Don/Doff left sock;Don/Doff right shoe;Don/Doff left shoe Lower body dressing/undressing: 3: Mod-Patient completed 50-74% of tasks  FIM - Toileting Toileting: 0: Activity did not occur  FIM - Archivist Transfers: 0-Activity did not occur  FIM - Banker Devices: Bed rails Bed/Chair Transfer: 1: Two helpers  FIM - Locomotion: Wheelchair Distance: 100 Locomotion: Wheelchair: 2: Travels 50 - 149 ft with minimal assistance (Pt.>75%) FIM - Locomotion: Ambulation Locomotion: Ambulation Assistive Devices: Chief Executive Officer - Hemi;Walker - Platform Ambulation/Gait Assistance: 1: +2 Total assist Locomotion: Ambulation: 1: Two helpers  Comprehension Comprehension Mode: Auditory Comprehension: 4-Understands basic 75 - 89% of the time/requires cueing 10 - 24% of the time  Expression Expression Mode: Verbal Expression: 5-Expresses complex 90% of the  time/cues < 10% of the time  Social Interaction Social Interaction: 4-Interacts appropriately 75 - 89% of the time - Needs redirection for appropriate language or to initiate interaction.  Problem Solving Problem Solving: 3-Solves basic 50 - 74% of the time/requires cueing 25 -  49% of the time  Memory Memory: 4-Recognizes or recalls 75 - 89% of the time/requires cueing 10 - 24% of the time  Medical Problem List and Plan:  1. DVT Prophylaxis/Anticoagulation: Pharmaceutical: Lovenox  2. Pain Management: Prn ultram for headaches.  3. Mood: will need a lot of ego support as high levels of frustration noted. Xanax prn neuropsych assessment and recs would be helpful as well.  4. Neuropsych: This patient is capable of making decisions on his/her own behalf.  5. STEMI/Non-obstructiveCAD: Treated medically. Ace initiated for BP control.  6. Non-ischemic CM: HTN v/s CVA related.  7. Borderline DM :Hgb A1C at 6.8. Continue CBG checks AC/HS with SSI. May need oral agent.  8. HTN: Will monitor with bid checks. Avoid hypotension to prevent hypoperfusion.  9. Dyslipidemia: continue lipitor.  10. Leucocytosis:  UA/UCS -, afeb monitor 11. Poor po intake enc  LOS (Days) 7 A FACE TO FACE EVALUATION WAS PERFORMED  Ron Junco E 07/08/2012, 7:50 AM

## 2012-07-08 NOTE — Progress Notes (Signed)
Occupational Therapy Weekly Progress Note and Treatment Session  Patient Details  Name: Glenn Maldonado MRN: [id number] Date of Birth: [birth year]  Today's Date: 07/08/2012 Time: 0830-0930 Time Calculation (min): 60 min  Patient has met 4 of 5 short term goals.    Patient continues to demonstrate the following deficits: decreased LB dressing, standing/sitting balance, A/ROM left UE, functional transfers and therefore will continue to benefit from skilled OT intervention to enhance overall performance with BADL.  Patient progressing toward long term goals..  Continue plan of care.  OT Short Term Goals Week 1:  OT Short Term Goal 1 (Week 1): Patient will increase grooming to Min Assist sitting at [employer].  OT Short Term Goal 1 - Progress (Week 1): Met OT Short Term Goal 2 (Week 1): Patient will increase UB dressing to Min Assist with hemi-dressing techniques. OT Short Term Goal 2 - Progress (Week 1): Met OT Short Term Goal 3 (Week 1): Patient will increase LB bathing to Mod Assist with long handled sponge. OT Short Term Goal 3 - Progress (Week 1): Progressing toward goal OT Short Term Goal 4 (Week 1): Patient will increase LB Dressing to Max Assist with hemi dressing techniques.  OT Short Term Goal 4 - Progress (Week 1): Met OT Short Term Goal 5 (Week 1): Patient will increase toilet transfer to Max Assist with grab bar and elevated toilet seat. OT Short Term Goal 5 - Progress (Week 1): Met Week 2:  OT Short Term Goal 1 (Week 2): LTG= STG  Skilled Therapeutic Interventions/Progress Updates:  ADL re-training performed in shower this AM. 3-in-1 commode seat used in shower to provide lateral support when needed. Session with focus on hemi-dressing technique, standing balance, sitting balance, functional transfers, bathing and dressing. Patient was given long-handled sponge to increase functional performance during LB bathing. Patient required increased assistance during LB dressing due to fatigue.  Patient stated that he felt weak this morning.  Therapy Documentation Precautions:  Precautions Precautions: Fall Precaution Comments: hemiplegia LLE, hypertonus LLE Restrictions Weight Bearing Restrictions: No Pain: Pain Assessment Pain Assessment: 0-10 Pain Score:   1 Pain Type: Acute pain Pain Location: Back Pain Orientation: Lower Pain Descriptors: Aching Pain Frequency: Intermittent Pain Onset: Gradual Pain Intervention(s): Refused  See FIM for current functional status  Therapy/Group: Individual Therapy  Janese Radabaugh, Charisse March 07/08/2012, 10:27 AM

## 2012-07-08 NOTE — Progress Notes (Signed)
Social Work Patient ID: Glenn Maldonado, male   DOB: 1961/06/02, 51 y.o.   MRN: [id number] Met with pt, wife and sister in-law to inform of team conference goals-min level and discharge 9/18.  Pt hope she is making good progress, acknowledged he is doing very well. Wife is here and attends therapies with pt and is impressed with the progress he has made.  Her main goal is to work on [employer] and medicaid to [place] the stress it has caused pt. She feels all of the stress he has been under has caused his stroke.  Both agreeable to the plan.  Pt is hopeful he will pass his swallow test Friday.  Continue to work on discharge plan.

## 2012-07-09 ENCOUNTER — Inpatient Hospital Stay (HOSPITAL_COMMUNITY): Payer: Medicaid - Out of State

## 2012-07-09 ENCOUNTER — Inpatient Hospital Stay (HOSPITAL_COMMUNITY): Payer: Self-pay

## 2012-07-09 ENCOUNTER — Inpatient Hospital Stay (HOSPITAL_COMMUNITY): Payer: Medicaid - Out of State | Admitting: Occupational Therapy

## 2012-07-09 LAB — GLUCOSE, CAPILLARY: Glucose-Capillary: 163 mg/dL — ABNORMAL HIGH (ref 70–99)

## 2012-07-09 MED ORDER — TRAMADOL HCL 50 MG PO TABS
50.0000 mg | ORAL_TABLET | Freq: Four times a day (QID) | ORAL | Status: DC
  Administered 2012-07-09 – 2012-07-22 (×52): 50 mg via ORAL
  Filled 2012-07-09 (×52): qty 1

## 2012-07-09 MED ORDER — LISINOPRIL 20 MG PO TABS
20.0000 mg | ORAL_TABLET | Freq: Every day | ORAL | Status: DC
  Administered 2012-07-09 – 2012-07-10 (×2): 20 mg via ORAL
  Filled 2012-07-09 (×4): qty 1

## 2012-07-09 MED ORDER — HYDROCHLOROTHIAZIDE 12.5 MG PO CAPS
12.5000 mg | ORAL_CAPSULE | Freq: Every day | ORAL | Status: DC
  Filled 2012-07-09 (×2): qty 1

## 2012-07-09 NOTE — Progress Notes (Signed)
Speech Language Pathology Daily Session Note  Patient Details  Name: Glenn Maldonado MRN: [id number] Date of Birth: 01-Oct-1961  Today's Date: 07/09/2012 Time: 1340-1440 Time Calculation (min): 60 min  Short Term Goals: Week 1: SLP Short Term Goal 1 (Week 1): Patient will consume Dys.2 textures and honey-thick liquids with min assist verbal cues to utilize compensatory strategies which prevent overt s/s of aspiraiton SLP Short Term Goal 1 - Progress (Week 1): Progressing toward goal SLP Short Term Goal 2 (Week 1): Patient will sustain attention to basic, familiar task for 5 minutes with min assist verbal cues. SLP Short Term Goal 2 - Progress (Week 1): Progressing toward goal SLP Short Term Goal 3 (Week 1): Patient will perform oral motor and diaphragmatic breahting exercises with mod assist verbal and visual cues. SLP Short Term Goal 3 - Progress (Week 1): Progressing toward goal SLP Short Term Goal 4 (Week 1): Patient will utilize speech intelligibility strategies during conversation with min assist verbal cues  SLP Short Term Goal 5 (Week 1): Patient will request help as needed during completion of basic ADLs with min assist verbal/question cues.  Skilled Therapeutic Interventions: Co-treatment session with OT;  SLP facilitated session with min assist verbal cues to recall and utilize safe swallow compensatory strategies while consuming Dys.2 textures and honey-thick liquids.  Patient demonstrated improvement in monitoring and clearing wet vocal quality.  SLP recommends a repeat objective assessment 07/10/12 to assess for continued silent aspiration of liquid consistencies.     Pain Pain Assessment Pain Assessment: No/denies pain  Therapy/Group: Group Therapy  Charlane Ferretti., CCC-SLP [phone]  [name] 07/09/2012, 2:57 PM

## 2012-07-09 NOTE — Progress Notes (Signed)
Physical Therapy Session Note  Patient Details  Name: Glenn Maldonado MRN: [id number] Date of Birth: 03/16/1961  Today's Date: 07/09/2012 Time: 1340-1440 Time Calculation (min): 60 min  Short Term Goals: Week 2:  PT Short Term Goal 1 (Week 2): Pt will perform all bed mobility with supervision, VCs. PT Short Term Goal 2 (Week 2): Pt will transfer L and R with min assist consistently. PT Short Term Goal 3 (Week 2): Pt will propel w/c x 50' in congested home environment with supervision. PT Short Term Goal 4 (Week 2): Pt will perform gait x 25' with assistance of 1 person. PT Short Term Goal 5 (Week 2): Pt will ascend and descend 1 step with mod assist.  Skilled Therapeutic Interventions/Progress Updates:  Pt c/o fatigue, poor sleep last night.  neuromuscular re-education for bed mobility, via tactile cues, forced use, VCs: rolling R with supervision after set-up; rolling r with supervision; R sidelying > sit with min assist to initiate  Pt performed isolated movements in L sidelying for LLE motor control: 10 x 1 hipflexion/extension with quiet knee; knee flexion/extension with quiet hip; in supine: 10 x 1 abdominal activation by cervical flexion, bil knees flexed, and 5 x 1 bil bridging and upper trunk scooting to assist with lateral movements in bed.  Pt c/o nausea upon sitting up, gagged, but did not vomit; resolved somewhat after a couple of minutes.  PA aware.  She requested vestibular eval.  Bed> w/c to L stand/pivot with mod assist.  Sit>< stand from w/c x 3 with mod assist.  Gait training x 18' with L PRFW, Ace on L LE for foot drop, +2 assist, (one person guiding RW) pt . 50%.  Focus on L foot placement with neutral hip rotation, upright trunk, forward gaze, L stance control.  Pt had L LE clonus during gait training, interfering with foot placement.     Therapy Documentation Precautions:  Precautions Precautions: Fall Precaution Comments: LLE clonus during gait  training Restrictions Weight Bearing Restrictions: No   VOxygen Therapy SpO2: 94 % O2 Device: None (Room air) Pain: Pain Assessment Pain Assessment: No/denies pain, but had pain meds earlier for R hip, R quad soreness         See FIM for current functional status  Therapy/Group: Individual Therapy  [name] 07/09/2012, 3:21 PM

## 2012-07-09 NOTE — Progress Notes (Addendum)
Occupational Therapy Session Note  Patient Details  Name: Truitt Cruey MRN: [id number] Date of Birth: Mar 12, 1961  Today's Date: 07/09/2012 Time: 4098-1191 [phone]) Time Calculation (min): 75 min (30 min)  Short Term Goals: Week 2:  OT Short Term Goal 1 (Week 2): LTG= STG  Skilled Therapeutic Interventions/Progress Updates:  Session 1: ADL re-training performed in shower this AM. 3-in-1 commode seat used in shower to provide lateral support when needed. Session with focus on hemi-dressing technique, standing balance, sitting balance, functional transfers, bathing and dressing. Patient expressed pain in back and hip this AM which hindered his performance and required increased time during ADL.   Session 2:  Bioness initiated this AM. Left Medium device, D:9, Ext: 9 (A panel), Flex: 9 (B panel), Mode: Open/close exercise for 5 minutes. Patient tolerated well with good movement during flexion and extension of digits. AA/ROM on table top with left UE placed on towel; shoulder flexion/extension, internal/external rotation. P/ROM; Left UE; elbow flexion/extension, supination/pronation. Kinesiotape applied to left shoulder to provide tactile input and increase proprioception and awareness of muscles and to position the shoulder in proper alignment.    Therapy Documentation Precautions:  Precautions Precautions: Fall Precaution Comments: hemiplegia LLE, hypertonus LLE Restrictions Weight Bearing Restrictions: No Pain: Pain Assessment Pain Assessment: 0-10 Pain Score:   5 Pain Type: Acute pain Pain Location: Back Pain Orientation: Left;Lower Pain Descriptors: Aching Pain Frequency: Constant Pain Intervention(s): Medication (See eMAR);RN made aware  See FIM for current functional status  Therapy/Group: Individual Therapy  Limmie Patricia, OTR/L 07/09/2012, 12:06 PM

## 2012-07-09 NOTE — Progress Notes (Signed)
Patient ID: Glenn Maldonado, male   DOB: 05/10/1961, 51 y.o.   MRN: [id number]  Subjective/Complaints: Walked in therapy,  Objective: Vital Signs: Blood pressure 158/79, pulse 69, temperature 97.8 F (36.6 C), temperature source Oral, resp. rate 19, height 5\' 10"  (1.778 m), weight 79.1 kg (174 lb 6.1 oz), SpO2 97.00%. No results found. Results for orders placed during the hospital encounter of 07/01/12 (from the past 72 hour(s))  BASIC METABOLIC PANEL     Status: Abnormal   Collection Time   07/06/12  9:00 AM      Component Value Range Comment   Sodium 140  135 - 145 mEq/L    Potassium 3.9  3.5 - 5.1 mEq/L    Chloride 101  96 - 112 mEq/L    CO2 31  19 - 32 mEq/L    Glucose, Bld 131 (*) 70 - 99 mg/dL    BUN 25 (*) 6 - 23 mg/dL    Creatinine, Ser 2.13  0.50 - 1.35 mg/dL    Calcium 9.7  8.4 - 08.6 mg/dL    GFR calc non Af Amer 71 (*) >90 mL/min    GFR calc Af Amer 82 (*) >90 mL/min   GLUCOSE, CAPILLARY     Status: Abnormal   Collection Time   07/06/12 12:39 PM      Component Value Range Comment   Glucose-Capillary 167 (*) 70 - 99 mg/dL   GLUCOSE, CAPILLARY     Status: Abnormal   Collection Time   07/06/12  4:29 PM      Component Value Range Comment   Glucose-Capillary 140 (*) 70 - 99 mg/dL    Comment 1 Notify RN     GLUCOSE, CAPILLARY     Status: Abnormal   Collection Time   07/06/12  8:53 PM      Component Value Range Comment   Glucose-Capillary 135 (*) 70 - 99 mg/dL    Comment 1 Notify RN     GLUCOSE, CAPILLARY     Status: Abnormal   Collection Time   07/07/12  7:21 AM      Component Value Range Comment   Glucose-Capillary 114 (*) 70 - 99 mg/dL    Comment 1 Notify RN     GLUCOSE, CAPILLARY     Status: Abnormal   Collection Time   07/07/12 11:45 AM      Component Value Range Comment   Glucose-Capillary 134 (*) 70 - 99 mg/dL    Comment 1 Notify RN     GLUCOSE, CAPILLARY     Status: Abnormal   Collection Time   07/07/12  4:36 PM      Component Value Range Comment   Glucose-Capillary 108 (*) 70 - 99 mg/dL    Comment 1 Notify RN     GLUCOSE, CAPILLARY     Status: Abnormal   Collection Time   07/07/12  9:00 PM      Component Value Range Comment   Glucose-Capillary 141 (*) 70 - 99 mg/dL    Comment 1 Notify RN     BASIC METABOLIC PANEL     Status: Abnormal   Collection Time   07/08/12 12:11 AM      Component Value Range Comment   Sodium 143  135 - 145 mEq/L    Potassium 4.2  3.5 - 5.1 mEq/L    Chloride 105  96 - 112 mEq/L    CO2 31  19 - 32 mEq/L    Glucose, Bld 119 (*) 70 -  99 mg/dL    BUN 23  6 - 23 mg/dL    Creatinine, Ser 4.54  0.50 - 1.35 mg/dL    Calcium 9.4  8.4 - 09.8 mg/dL    GFR calc non Af Amer 79 (*) >90 mL/min    GFR calc Af Amer >90  >90 mL/min   GLUCOSE, CAPILLARY     Status: Abnormal   Collection Time   07/08/12  8:01 AM      Component Value Range Comment   Glucose-Capillary 124 (*) 70 - 99 mg/dL    Comment 1 Notify RN     CBC WITH DIFFERENTIAL     Status: Abnormal   Collection Time   07/08/12  8:38 AM      Component Value Range Comment   WBC 12.0 (*) 4.0 - 10.5 K/uL    RBC 5.23  4.22 - 5.81 MIL/uL    Hemoglobin 14.9  13.0 - 17.0 g/dL    HCT 11.9  14.7 - 82.9 %    MCV 84.7  78.0 - 100.0 fL    MCH 28.5  26.0 - 34.0 pg    MCHC 33.6  30.0 - 36.0 g/dL    RDW 56.2  13.0 - 86.5 %    Platelets 257  150 - 400 K/uL    Neutrophils Relative 64  43 - 77 %    Neutro Abs 7.7  1.7 - 7.7 K/uL    Lymphocytes Relative 25  12 - 46 %    Lymphs Abs 3.0  0.7 - 4.0 K/uL    Monocytes Relative 7  3 - 12 %    Monocytes Absolute 0.8  0.1 - 1.0 K/uL    Eosinophils Relative 4  0 - 5 %    Eosinophils Absolute 0.5  0.0 - 0.7 K/uL    Basophils Relative 1  0 - 1 %    Basophils Absolute 0.1  0.0 - 0.1 K/uL   GLUCOSE, CAPILLARY     Status: Abnormal   Collection Time   07/08/12 11:22 AM      Component Value Range Comment   Glucose-Capillary 142 (*) 70 - 99 mg/dL    Comment 1 Notify RN     GLUCOSE, CAPILLARY     Status: Abnormal   Collection Time    07/08/12  5:28 PM      Component Value Range Comment   Glucose-Capillary 120 (*) 70 - 99 mg/dL   GLUCOSE, CAPILLARY     Status: Normal   Collection Time   07/08/12  9:13 PM      Component Value Range Comment   Glucose-Capillary 88  70 - 99 mg/dL      HEENT: poor dentition Cardio: RRR Resp: CTA B/L after clears with cough otherwise upper airway sounds GI: BS positive Extremity:  Edema mild L dorsum hand Skin:   Intact Neuro: Lethargic, Cranial Nerve Abnormalities R central 7, Abnormal Motor 1/5 L FF,2/5 L deltoid, biceps and triceps, Abnormal FMC Ataxic/ dec FMC and Tone  increased extensor tone in LUE, Tone:  Hypertonia and Dysarthric Musc/Skel:  Normal   Assessment/Plan: 1. 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 Physiatrist is providing close team supervision and 24 hour management of active medical problems listed below. Physiatrist and rehab team continue to assess barriers to discharge/monitor patient progress toward functional and medical goals. FIM: FIM - Bathing Bathing Steps Patient Completed: Chest;Right Arm;Left Arm;Abdomen;Front  perineal area;Buttocks;Right upper leg;Left upper leg;Right lower leg (including foot);Left lower leg (including foot) Bathing: 4: Min-Patient completes 8-9 37f 10 parts or 75+ percent  FIM - Upper Body Dressing/Undressing Upper body dressing/undressing steps patient completed: Thread/unthread right sleeve of pullover shirt/dresss;Thread/unthread left sleeve of pullover shirt/dress;Put head through opening of pull over shirt/dress;Pull shirt over trunk Upper body dressing/undressing: 4: Min-Patient completed 75 plus % of tasks FIM - Lower Body Dressing/Undressing Lower body dressing/undressing steps patient completed: Thread/unthread right pants leg;Thread/unthread left pants leg;Pull pants up/down;Don/Doff right sock;Don/Doff  left sock;Don/Doff right shoe;Don/Doff left shoe Lower body dressing/undressing: 2: Max-Patient completed 25-49% of tasks  FIM - Toileting Toileting: 0: Activity did not occur  FIM - Archivist Transfers: 0-Activity did not occur  FIM - Banker Devices: Bed rails Bed/Chair Transfer: 3: Supine > Sit: Mod A (lifting assist/Pt. 50-74%/lift 2 legs;3: Sit > Supine: Mod A (lifting assist/Pt. 50-74%/lift 2 legs)  FIM - Locomotion: Wheelchair Distance: 100 Locomotion: Wheelchair: 2: Travels 50 - 149 ft with minimal assistance (Pt.>75%) FIM - Locomotion: Ambulation Locomotion: Ambulation Assistive Devices: Chief Executive Officer - Hemi;Walker - Platform Ambulation/Gait Assistance: 1: +2 Total assist Locomotion: Ambulation: 1: Two helpers  Comprehension Comprehension Mode: Auditory Comprehension: 4-Understands basic 75 - 89% of the time/requires cueing 10 - 24% of the time  Expression Expression Mode: Verbal Expression: 5-Expresses basic 90% of the time/requires cueing < 10% of the time.  Social Interaction Social Interaction: 4-Interacts appropriately 75 - 89% of the time - Needs redirection for appropriate language or to initiate interaction.  Problem Solving Problem Solving: 3-Solves basic 50 - 74% of the time/requires cueing 25 - 49% of the time  Memory Memory: 3-Recognizes or recalls 50 - 74% of the time/requires cueing 25 - 49% of the time  Medical Problem List and Plan:  1. DVT Prophylaxis/Anticoagulation: Pharmaceutical: Lovenox  2. Pain Management: Prn ultram for headaches.  3. Mood: will need a lot of ego support as high levels of frustration noted. Xanax prn neuropsych assessment and recs would be helpful as well.  4. Neuropsych: This patient is capable of making decisions on his/her own behalf.  5. STEMI/Non-obstructiveCAD: Treated medically. Ace initiated for BP control.  6. Non-ischemic CM: HTN v/s CVA related.  7.  Borderline DM :Hgb A1C at 6.8. Continue CBG checks AC/HS with SSI. May need oral agent.  8. HTN: Will monitor with bid checks. Cozaar not covered by $4 plan will trial Lisinopril may also need HCTZ  9. Dyslipidemia: continue lipitor.  10. Leucocytosis:  UA/UCS -, afeb monitor   LOS (Days) 8 A FACE TO FACE EVALUATION WAS PERFORMED  KIRSTEINS,ANDREW E 07/09/2012, 7:17 AM

## 2012-07-09 NOTE — Progress Notes (Signed)
Occupational Therapy Session Note  Patient Details  Name: Man Effertz MRN: [id number] Date of Birth: [birth year]  Today's Date: 07/09/2012 Time: 1130-1145 Time Calculation (min): 15 min  Skilled Therapeutic Interventions/Progress Updates: Patient participated in [employer] today.  Patient more lethargic today, and attributes this to recent medicine given for back pain.  Patient reports back is still causing him discomfort, despite medication. Patient less interactive with others, yet managing task of self feeding with only intermittent cues, for not talking with food in mouth, and clearing food from outside of his lips.  Patient indicates he is beginning to feel lips better which is helping.  Patient unable to utilize left upper extremity functionally, yet ensures that extremity is supported on table during meals.     Therapy Documentation Precautions:  Precautions Precautions: Fall Precaution Comments: LLE clonus during gait training Restrictions Weight Bearing Restrictions: No  Pain: Pain Assessment Pain Assessment: No/denies pain  See FIM for current functional status  Therapy/Group: Group Therapy  [name] 07/09/2012, 4:20 PM

## 2012-07-10 ENCOUNTER — Inpatient Hospital Stay (HOSPITAL_COMMUNITY): Payer: Medicaid - Out of State

## 2012-07-10 ENCOUNTER — Inpatient Hospital Stay (HOSPITAL_COMMUNITY): Payer: Self-pay

## 2012-07-10 ENCOUNTER — Inpatient Hospital Stay (HOSPITAL_COMMUNITY): Payer: Medicaid - Out of State | Admitting: Speech Pathology

## 2012-07-10 ENCOUNTER — Inpatient Hospital Stay (HOSPITAL_COMMUNITY): Payer: Medicaid - Out of State | Admitting: Occupational Therapy

## 2012-07-10 LAB — GLUCOSE, CAPILLARY
Glucose-Capillary: 99 mg/dL (ref 70–99)
Glucose-Capillary: 139 mg/dL — ABNORMAL HIGH (ref 70–99)
Glucose-Capillary: 149 mg/dL — ABNORMAL HIGH (ref 70–99)

## 2012-07-10 MED ORDER — BACLOFEN 10 MG PO TABS
10.0000 mg | ORAL_TABLET | Freq: Every day | ORAL | Status: DC
  Administered 2012-07-10 – 2012-07-21 (×12): 10 mg via ORAL
  Filled 2012-07-10 (×14): qty 1

## 2012-07-10 NOTE — Progress Notes (Signed)
Speech Language Pathology Weekly Progress Note  Patient Details  Name: Glenn Maldonado MRN: [id number] Date of Birth: 1961/08/13  Today's Date: 07/10/2012  Short Term Goals: Week 1: SLP Short Term Goal 1 (Week 1): Patient will consume Dys.2 textures and honey-thick liquids with min assist verbal cues to utilize compensatory strategies which prevent overt s/s of aspiraiton SLP Short Term Goal 1 - Progress (Week 1): Met SLP Short Term Goal 2 (Week 1): Patient will sustain attention to basic, familiar task for 5 minutes with min assist verbal cues. SLP Short Term Goal 2 - Progress (Week 1): Met SLP Short Term Goal 3 (Week 1): Patient will perform oral motor and diaphragmatic breahting exercises with mod assist verbal and visual cues. SLP Short Term Goal 3 - Progress (Week 1): Met SLP Short Term Goal 4 (Week 1): Patient will utilize speech intelligibility strategies during conversation with min assist verbal cues  SLP Short Term Goal 4 - Progress (Week 1): Met SLP Short Term Goal 5 (Week 1): Patient will request help as needed during completion of basic ADLs with min assist verbal/question cues. SLP Short Term Goal 5 - Progress (Week 1): Met Week 2: SLP Short Term Goal 1 (Week 2): Patient will consume Dys.2 textures and honey-thick liquids with supervision level verbal cues to utilize compensatory strategies which prevent overt s/s of aspiraiton  SLP Short Term Goal 2 (Week 2): Patient will perform pharyngeal strengthening and diaphragmatic breahting exercises with min assist verbal and visual cues. SLP Short Term Goal 3 (Week 2): Patient will monitor and self correct speech intelligibility during conversation with modified independence.  SLP Short Term Goal 4 (Week 2): Patient will solve moderately complex problems with supervision level verbal cues.  Weekly Progress Updates: Patient met 5 out of 5 short term objectives this reporting period with gains in swallow function, speech intelligibility,  attention and problem solving.  Patient progressed from a Dys.2 texture diet with honey-thick liquids to a regular texture diet with thin liquids.  Patient has also made gains in his abilities to attend by progressing from a sustained attention level to an alternating attention level.  During dysphagia group treatment he is able to alternate between self-feeding with use of safe swallow compensatory strategies and conversation with peers with supervision level cues.  His speech intelligibly during conversation is also marked with improvement.  Patient is requesting help as needed and solving basic problems with supervision level cues due to some mild, continued impulsivity.  As a result, it is recommended that this patient continue to receive skilled SLP services to address higher level cognitive tasks and facilitate continued functional independence to reduce burden of care upon discharge.     SLP Frequency: 1-2 X/day, 30-60 minutes;5 out of 7 days Estimated Length of Stay: anticipated discharge 9/18 SLP Treatment/Interventions: Cognitive remediation/compensation;Cueing hierarchy;Dysphagia/aspiration precaution training;Environmental controls;Functional tasks;Internal/external aids;Medication managment;Patient/family education;Speech/Language facilitation;Therapeutic Activities;Therapeutic Exercise;Other (comment) (pharyngeal strengthening exercises; group treatments)  Glenn Maldonado, M.A., CCC-SLP [phone]  Glenn Maldonado 07/10/2012, 10:55 AM

## 2012-07-10 NOTE — Progress Notes (Signed)
Speech Language Pathology Daily Session Note  Patient Details  Name: Glenn Maldonado MRN: [id number] Date of Birth: 07-Dec-1960  Today's Date: 07/10/2012 Time: 1200-1230 Time Calculation (min): 30 min  Short Term Goals: Week 2: SLP Short Term Goal 1 (Week 2): Patient will consume Dys.2 textures and honey-thick liquids with supervision level verbal cues to utilize compensatory strategies which prevent overt s/s of aspiraiton  SLP Short Term Goal 2 (Week 2): Patient will perform pharyngeal strengthening and diaphragmatic breahting exercises with min assist verbal and visual cues. SLP Short Term Goal 3 (Week 2): Patient will monitor and self correct speech intelligibility during conversation with modified independence.  SLP Short Term Goal 4 (Week 2): Patient will solve moderately complex problems with supervision level verbal cues.  Skilled Therapeutic Interventions: Co-treatment session with OT; SLP facilitated session with min assist verbal cues to utilize safe swallow compensatory strategies: 2 swallows intermittent hard throat clear/cough, while consuming Regular textures and thin liquids via cup. Patient demonstrated improvement in monitoring and clearing wet vocal quality with increased glottis closure as evidenced by a stronger cough.    FIM:  Comprehension Comprehension Mode: Auditory Comprehension: 4-Understands basic 75 - 89% of the time/requires cueing 10 - 24% of the time Expression Expression Mode: Verbal Expression: 5-Expresses basic needs/ideas: With extra time/assistive device Social Interaction Social Interaction: 6-Interacts appropriately with others with medication or extra time (anti-anxiety, antidepressant). Problem Solving Problem Solving: 4-Solves basic 75 - 89% of the time/requires cueing 10 - 24% of the time Memory Memory: 5-Recognizes or recalls 90% of the time/requires cueing < 10% of the time FIM - Eating Eating Activity: 5: Supervision/cues;5: Set-up assist for  open containers  Pain Pain Assessment Pain Assessment: No/denies pain  Therapy/Group: Group Therapy  Charlane Ferretti., CCC-SLP [phone]  [name] 07/10/2012, 1:21 PM

## 2012-07-10 NOTE — Progress Notes (Signed)
Occupational Therapy Session Note  Patient Details  Name: Glenn Maldonado MRN: [id number] Date of Birth: 05-17-1961  Today's Date: 07/10/2012 Time: 1130-1200 Time Calculation (min): 30 min   Skilled Therapeutic Interventions/Progress Updates: Patient participated in [employer] today to address self feeding skills.  Patient recently upgraded to Reg Diet and Thin liquids.  Patient very excited to eat a cheeseburger and french fries.  Patient assisted to utilize his left hand to eat finger foods.  Patient with active elbow flexion and extension, yet difficulty recruiting these movements during functional activity.  Patient needed cueing regarding double swallow, and hard cough.       Therapy Documentation Precautions:  Precautions Precautions: Fall Precaution Comments: LLE clonus Restrictions Weight Bearing Restrictions: No  Pain: Pain Assessment Pain Assessment: No/denies pain Pain Score:   2 Pain Type: Acute pain Pain Location: Hip Pain Orientation: Right Pain Intervention(s): Medication (See eMAR)    See FIM for current functional status  Therapy/Group: Group Therapy  [name] 07/10/2012, 4:04 PM

## 2012-07-10 NOTE — Progress Notes (Signed)
Patient has been complaining of R upper jaw pain since his stroke.  Oral mucosa appears normal.  Informed Marissa Nestle, PA.  No orders at this time.  Patient resting comfortably at this time.  Barrie Lyme 3:50 PM 07/10/2012

## 2012-07-10 NOTE — Progress Notes (Signed)
Physical Therapy Session Note  Patient Details  Name: Glenn Maldonado MRN: [id number] Date of Birth: [birth year]  Today's Date: 07/10/2012 Time: 1305-1350 Time Calculation (min): 45 min  Short Term Goals: Week 2:  PT Short Term Goal 1 (Week 2): Pt will perform all bed mobility with supervision, VCs. PT Short Term Goal 2 (Week 2): Pt will transfer L and R with min assist consistently. PT Short Term Goal 3 (Week 2): Pt will propel w/c x 50' in congested home environment with supervision. PT Short Term Goal 4 (Week 2): Pt will perform gait x 25' with assistance of 1 person. PT Short Term Goal 5 (Week 2): Pt will ascend and descend 1 step with mod assist.  Skilled Therapeutic Interventions/Progress Updates:   Pt nauseated, vomited earlier.  Consulted RN and PA.  PA would like a vestibular eval due to frequent nausea, c/o dizziness; eval pending.  Pt reported muscle spasms bil LEs last night; given Baclofen.  W/c> mat squat pivot to R with max assist, lifting and lowering; to L with mod assist, focusing on squat pivot keeping R hand on a stable surface.  PROM L hamstrings, hip ER.  Sit>< stand to LPFRW x 5, working on wt shifting, = wt bearing L and RLE, and balancing with mod assist while manipulating L wrist strap.  Neuromuscular re-education via tactile cues, VCs, demo for L stance control in standing, Ace on L LE for foot drop.  Limited gait training as pt stated he felt weak, x 5' forward, 5' backward, with mod assist overall, clonus LLE .  Attempted using L hand splint on RW per pt's request, but he was unable to keep L hand in splint during gait.  W/c > bed to R, squat/pivot with mod assist.  Sit> supine with supervision.  Neuromuscular re-education via forced use for bil bridging to scoot up in bed rather than using rail.  Pt positioned for comfort in bed; bed alarm activated.  Therapy Documentation Precautions:  Precautions Precautions: Fall Precaution Comments: LLE  clonus Restrictions Weight Bearing Restrictions: No    Vital Signs: Therapy Vitals Pulse Rate: 64  BP: 160/79 mmHg Pain: Pain Assessment Pain Assessment: No/denies pain      See FIM for current functional status  Therapy/Group: Individual Therapy  [name] 07/10/2012, 2:03 PM

## 2012-07-10 NOTE — Procedures (Signed)
Objective Swallowing Evaluation: Modified Barium Swallowing Study  Patient Details  Name: Glenn Maldonado MRN: [id number] Date of Birth: 06/23/1961  Today's Date: 07/10/2012 Time: 0930-1000 Time Calculation (min): 30 min  Past Medical History:  Past Medical History  Diagnosis Date  . Hypertension   . Borderline diabetic   . STEMI (ST elevation myocardial infarction) 06/28/2012  . HTN (hypertension), uncontrolled 06/28/2012  . Leg weakness, bilateral 06/28/2012  . Tobacco abuse 06/28/2012  . Weight loss, non-intentional 06/28/2012  . OSA (obstructive sleep apnea), history of, but with recent weight loss less symptoms 06/28/2012  . Hepatitis C 2001    treated with interferon  . NICM (nonischemic cardiomyopathy), EF 35% at cardiac cath 06/28/2012   Past Surgical History: No past surgical history on file. HPI:  51 y.o. male with history of HTN, Hep C, admitted on 06/28/12 pm with complaints of chest pressure and difficulty walking as well as left arm heaviness with tingling sensation . BP elevated at 178/118 EKG with ST changes and patient started on IV heparin and CT head done without evidence of bleed. Patient underwent cardiac cath revealing global hypokinesis with EF 40-45% and non obstructive CAD. Patient with hypotension with BP drop to 97/49 despite fluid bolus. Started on dopamine for stabilization. He developed facial droop with dysarthria and worsening of left sided weakness. Neurology consulted and felt that patient likely has thrombotic infarct and workup initiated. Carotid dopplers done revealing 40-59% left distal ICA stenosis. For MRI/MRA brain today. MMSS 06/30/12 recommended Dsy.2 and honey-thick liquids. Transferred to CIR 07/01/12 and re-evaluation warranted today due to presence of sensory deficits on last evaluation.      Recommendation/Prognosis  Clinical Impression Dysphagia Diagnosis: Mild pharyngeal phase dysphagia Clinical impression: [name] demonstrated functional  improvements as compared to his previous study, especially in oral strength and coordination. Patient continues to exhibit a mild pharyngeal sensory-motor based dysphagia characterized by reduced strength and coordination which results in delayed swallow initiation, premature loss of boluses and reduced laryngeal elevation during the swallow.  These deficits result in occasional trace, silent penetration of nectar-thick and thin liquids with mild residue post swallow.  With cues to utilize safe swallow compensatory strategies 2 swallows and intermittent throat clears penetrates were removed from laryngeal vestibule.  As a result, it is recommended that this patient initiate a regular texture and thin liquid diet with continued full supervision to monitor impulsivity and ensure strict use of compensatory strategies, which reduce patient's aspiration risk.  Swallow Evaluation Recommendations Diet Recommendations: Regular;Thin liquid Liquid Administration via: Cup;Straw Medication Administration: Whole meds with puree Supervision: Patient able to self feed;Full supervision/cueing for compensatory strategies;Trained caregiver to feed patient Compensations: Slow rate;Small sips/bites;Multiple dry swallows after each bite/sip;Clear throat intermittently Postural Changes and/or Swallow Maneuvers: Out of bed for meals;Seated upright 90 degrees Oral Care Recommendations: Oral care BID Follow up Recommendations: 24 hour supervision/assistance;Home health SLP Prognosis Prognosis for Safe Diet Advancement: Good Individuals Consulted Consulted and Agree with Results and Recommendations: Patient;Family member/caregiver Family Member Consulted: spouse  SLP Assessment/Plan Dysphagia Diagnosis: Mild pharyngeal phase dysphagia Clinical impression: [name] demonstrated functional improvements as compared to his previous study, especially in oral strength and coordination. Patient continues to exhibit a mild  pharyngeal sensory-motor based dysphagia characterized by reduced strength and coordination which results in delayed swallow initiation, premature loss of boluses and reduced laryngeal elevation during the swallow.  These deficits result in occasional trace, silent penetration of nectar-thick and thin liquids with mild residue post swallow.  With  cues to utilize safe swallow compensatory strategies 2 swallows and intermittent throat clears penetrates were removed from laryngeal vestibule.  As a result, it is recommended that this patient initiate a regular texture and thin liquid diet with continued full supervision to monitor impulsivity and ensure strict use of compensatory strategies, which reduce patient's aspiration risk.   Short Term Goals: Week 2: SLP Short Term Goal 1 (Week 2): Patient will consume Dys.2 textures and honey-thick liquids with supervision level verbal cues to utilize compensatory strategies which prevent overt s/s of aspiraiton  SLP Short Term Goal 2 (Week 2): Patient will perform pharyngeal strengthening and diaphragmatic breahting exercises with min assist verbal and visual cues. SLP Short Term Goal 3 (Week 2): Patient will monitor and self correct speech intelligibility during conversation with modified independence.  SLP Short Term Goal 4 (Week 2): Patient will solve moderately complex problems with supervision level verbal cues.  General:  Date of Onset: 06/28/12 HPI: 51 y.o. male with history of HTN, Hep C, admitted on 06/28/12 pm with complaints of chest pressure and difficulty walking as well as left arm heaviness with tingling sensation . BP elevated at 178/118 EKG with ST changes and patient started on IV heparin and CT head done without evidence of bleed. Patient underwent cardiac cath revealing global hypokinesis with EF 40-45% and non obstructive CAD. Patient with hypotension with BP drop to 97/49 despite fluid bolus. Started on dopamine for stabilization. He developed  facial droop with dysarthria and worsening of left sided weakness. Neurology consulted and felt that patient likely has thrombotic infarct and workup initiated. Carotid dopplers done revealing 40-59% left distal ICA stenosis. For MRI/MRA brain today. MMSS 06/30/12 recommended Dsy.2 and honey-thick liquids. Transferred to CIR 07/01/12 and re-evaluation warranted today due to presence of sensory deficits on last evaluation.  Type of Study: Modified Barium Swallowing Study Previous Swallow Assessment: BSE 06/30/12; MBSS 06/30/12; CIR BSE 07/02/12 Diet Prior to this Study: Dysphagia 2 (chopped);Honey-thick liquids Temperature Spikes Noted: No Respiratory Status: Room air History of Recent Intubation: No Behavior/Cognition: Alert;Cooperative;Pleasant mood Oral Cavity - Dentition: Missing dentition;Poor condition Oral Motor / Sensory Function: Within functional limits (with compensatory strategies) Self-Feeding Abilities: Able to feed self;Needs set up;Other (Comment) (cues for impulsivity) Patient Positioning: Upright in chair Baseline Vocal Quality: Clear;Low vocal intensity Volitional Cough: Weak;Other (Comment) (but improved) Volitional Swallow: Able to elicit Anatomy: Within functional limits Pharyngeal Secretions: Not observed secondary MBS  Reason for Referral:   Assess for presence of silent aspiration  Oral Phase Oral Preparation/Oral Phase Oral Phase: WFL Pharyngeal Phase  Pharyngeal Phase Pharyngeal Phase: Impaired Pharyngeal - Nectar Pharyngeal - Nectar Cup: Premature spillage to valleculae;Delayed swallow initiation;Penetration/Aspiration before swallow;Pharyngeal residue - valleculae;Compensatory strategies attempted (Comment) (cue for hard throat clear; increased wait time for 2nd swall) Penetration/Aspiration details (nectar cup): Material enters airway, remains ABOVE vocal cords then ejected out;Material enters airway, CONTACTS cords then ejected out (trace penetration) Pharyngeal  - Nectar Straw: Delayed swallow initiation;Premature spillage to valleculae;Penetration/Aspiration before swallow;Pharyngeal residue - valleculae Penetration/Aspiration details (nectar straw): Material enters airway, remains ABOVE vocal cords then ejected out Pharyngeal - Thin Pharyngeal - Thin Cup: Delayed swallow initiation;Premature spillage to valleculae;Premature spillage to pyriform sinuses;Penetration/Aspiration before swallow;Pharyngeal residue - valleculae Penetration/Aspiration details (thin cup): Material enters airway, remains ABOVE vocal cords then ejected out Pharyngeal - Thin Straw: Penetration/Aspiration before swallow;Delayed swallow initiation;Premature spillage to valleculae;Premature spillage to pyriform sinuses;Pharyngeal residue - valleculae Penetration/Aspiration details (thin straw): Material enters airway, remains ABOVE vocal cords then ejected out Pharyngeal -  Solids Pharyngeal - Puree: Not tested Pharyngeal - Regular: Delayed swallow initiation;Premature spillage to valleculae;Pharyngeal residue - valleculae;Compensatory strategies attempted (Comment) (increased wait time for second swallow) Pharyngeal Phase - Comment Pharyngeal Comment: occassional silent penetration of thin and nectar-thick liquids cued throat clears cleared  Cervical Esophageal Phase  Cervical Esophageal Phase Cervical Esophageal Phase: [facility]  Fae Pippin, M.A., CCC-SLP [phone]  Irfan Veal 07/10/2012, 10:40 AM

## 2012-07-10 NOTE — Progress Notes (Signed)
Occupational Therapy Session Note  Patient Details  Name: Glenn Maldonado MRN: [id number] Date of Birth: 1961/07/12  Today's Date: 07/10/2012 Time: 0730-0830 Time Calculation (min): 60 min   Short Term Goals: Week 2:  OT Short Term Goal 1 (Week 2): LTG= STG  Skilled Therapeutic Interventions/Progress Updates:  Session 1: Patient requested to dress at sink this AM. ADL re-training session with focus on standing balance, sitting balance, hemi-dressing techniques. Patient required vc's and physical assist for proper left foot placement before transfers and sit to stands at sink. Patient had a small emesis while sitting on edge of bed. May have been caused by meds that patient stated he was given earlier this morning. Nurse was made aware. When standing at sink patient required min vc's to stand up tall when pulling pants up.   Session 2:  Patient's wife and friends were visiting upon therapy arrival. Patient was so excited that he passed his swallow test this AM and is able to eat and drink a normal diet.  -Bioness performed to Left UE. Medium devices used; D: 9, Ext: 9 A Panel, Flex: 9 B Panel, Mode: Exercise (open/close) 5 min; Open Exercise Fast 1 (5 min). Patient tolerated well. Patient stated that it felt like he was really opening his fingers himself. -AA/ROM; LUE; shoulder, elbow, wrist, and finger ranges. -AA/ROM; LUE on table top with powder; shoulder flexion/extenstion; elbow flexion/extention (gravity eliminated); Mod-max Assist for movement at times.    Therapy Documentation Precautions:  Precautions Precautions: Fall Precaution Comments: LLE clonus during gait training Restrictions Weight Bearing Restrictions: No Pain: Pain Assessment Pain Assessment: No/denies pain   See FIM for current functional status  Therapy/Group: Individual Therapy  Limmie Patricia, OTR/L 07/10/2012, 8:35 AM

## 2012-07-10 NOTE — Progress Notes (Signed)
Patient ID: Glenn Maldonado, male   DOB: February 12, 1961, 51 y.o.   MRN: [id number]  Subjective/Complaints: Spasms LLE at noc  Objective: Vital Signs: Blood pressure 168/86, pulse 61, temperature 97.5 F (36.4 C), temperature source Oral, resp. rate 20, height 5\' 10"  (1.778 m), weight 79.1 kg (174 lb 6.1 oz), SpO2 94.00%. No results found. Results for orders placed during the hospital encounter of 07/01/12 (from the past 72 hour(s))  GLUCOSE, CAPILLARY     Status: Abnormal   Collection Time   07/07/12  7:21 AM      Component Value Range Comment   Glucose-Capillary 114 (*) 70 - 99 mg/dL    Comment 1 Notify RN     GLUCOSE, CAPILLARY     Status: Abnormal   Collection Time   07/07/12 11:45 AM      Component Value Range Comment   Glucose-Capillary 134 (*) 70 - 99 mg/dL    Comment 1 Notify RN     GLUCOSE, CAPILLARY     Status: Abnormal   Collection Time   07/07/12  4:36 PM      Component Value Range Comment   Glucose-Capillary 108 (*) 70 - 99 mg/dL    Comment 1 Notify RN     GLUCOSE, CAPILLARY     Status: Abnormal   Collection Time   07/07/12  9:00 PM      Component Value Range Comment   Glucose-Capillary 141 (*) 70 - 99 mg/dL    Comment 1 Notify RN     BASIC METABOLIC PANEL     Status: Abnormal   Collection Time   07/08/12 12:11 AM      Component Value Range Comment   Sodium 143  135 - 145 mEq/L    Potassium 4.2  3.5 - 5.1 mEq/L    Chloride 105  96 - 112 mEq/L    CO2 31  19 - 32 mEq/L    Glucose, Bld 119 (*) 70 - 99 mg/dL    BUN 23  6 - 23 mg/dL    Creatinine, Ser 4.09  0.50 - 1.35 mg/dL    Calcium 9.4  8.4 - 81.1 mg/dL    GFR calc non Af Amer 79 (*) >90 mL/min    GFR calc Af Amer >90  >90 mL/min   GLUCOSE, CAPILLARY     Status: Abnormal   Collection Time   07/08/12  8:01 AM      Component Value Range Comment   Glucose-Capillary 124 (*) 70 - 99 mg/dL    Comment 1 Notify RN     CBC WITH DIFFERENTIAL     Status: Abnormal   Collection Time   07/08/12  8:38 AM      Component Value Range  Comment   WBC 12.0 (*) 4.0 - 10.5 K/uL    RBC 5.23  4.22 - 5.81 MIL/uL    Hemoglobin 14.9  13.0 - 17.0 g/dL    HCT 91.4  78.2 - 95.6 %    MCV 84.7  78.0 - 100.0 fL    MCH 28.5  26.0 - 34.0 pg    MCHC 33.6  30.0 - 36.0 g/dL    RDW 21.3  08.6 - 57.8 %    Platelets 257  150 - 400 K/uL    Neutrophils Relative 64  43 - 77 %    Neutro Abs 7.7  1.7 - 7.7 K/uL    Lymphocytes Relative 25  12 - 46 %    Lymphs Abs 3.0  0.7 -  4.0 K/uL    Monocytes Relative 7  3 - 12 %    Monocytes Absolute 0.8  0.1 - 1.0 K/uL    Eosinophils Relative 4  0 - 5 %    Eosinophils Absolute 0.5  0.0 - 0.7 K/uL    Basophils Relative 1  0 - 1 %    Basophils Absolute 0.1  0.0 - 0.1 K/uL   GLUCOSE, CAPILLARY     Status: Abnormal   Collection Time   07/08/12 11:22 AM      Component Value Range Comment   Glucose-Capillary 142 (*) 70 - 99 mg/dL    Comment 1 Notify RN     GLUCOSE, CAPILLARY     Status: Abnormal   Collection Time   07/08/12  5:28 PM      Component Value Range Comment   Glucose-Capillary 120 (*) 70 - 99 mg/dL   GLUCOSE, CAPILLARY     Status: Normal   Collection Time   07/08/12  9:13 PM      Component Value Range Comment   Glucose-Capillary 88  70 - 99 mg/dL   GLUCOSE, CAPILLARY     Status: Abnormal   Collection Time   07/09/12  8:46 AM      Component Value Range Comment   Glucose-Capillary 108 (*) 70 - 99 mg/dL    Comment 1 Documented in Chart      Comment 2 Notify RN     GLUCOSE, CAPILLARY     Status: Abnormal   Collection Time   07/09/12 11:57 AM      Component Value Range Comment   Glucose-Capillary 112 (*) 70 - 99 mg/dL   GLUCOSE, CAPILLARY     Status: Normal   Collection Time   07/09/12  5:00 PM      Component Value Range Comment   Glucose-Capillary 89  70 - 99 mg/dL   GLUCOSE, CAPILLARY     Status: Abnormal   Collection Time   07/09/12  9:08 PM      Component Value Range Comment   Glucose-Capillary 163 (*) 70 - 99 mg/dL      HEENT: poor dentition Cardio: RRR Resp: CTA B/L after clears with  cough otherwise upper airway sounds GI: BS positive Extremity:  Edema mild L dorsum hand Skin:   Intact Neuro: Lethargic, Cranial Nerve Abnormalities R central 7, Abnormal Motor 1/5 L FF,2/5 L deltoid, biceps and triceps, Abnormal FMC Ataxic/ dec FMC and Tone  increased extensor tone in LUE, Tone:  Hypertonia and Dysarthric Musc/Skel:  Normal   Assessment/Plan: 1. 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 Physiatrist is providing close team supervision and 24 hour management of active medical problems listed below. Physiatrist and rehab team continue to assess barriers to discharge/monitor patient progress toward functional and medical goals. FIM: FIM - Bathing Bathing Steps Patient Completed: Chest;Right Arm;Left Arm;Abdomen;Front perineal area;Buttocks;Right upper leg;Left upper leg;Right lower leg (including foot);Left lower leg (including foot) Bathing: 4: Min-Patient completes 8-9 36f 10 parts or 75+ percent  FIM - Upper Body Dressing/Undressing Upper body dressing/undressing steps patient completed: Thread/unthread right sleeve of pullover shirt/dresss;Thread/unthread left sleeve of pullover shirt/dress;Put head through opening of pull over shirt/dress;Pull shirt over trunk Upper body dressing/undressing: 4: Min-Patient completed 75 plus % of tasks FIM - Lower Body Dressing/Undressing Lower body dressing/undressing steps patient completed: Thread/unthread right pants leg;Thread/unthread left pants leg;Pull pants up/down;Don/Doff right sock;Don/Doff left sock;Don/Doff right  shoe;Don/Doff left shoe Lower body dressing/undressing: 3: Mod-Patient completed 50-74% of tasks  FIM - Toileting Toileting: 0: Activity did not occur  FIM - Archivist Transfers: 0-Activity did not occur  FIM - Banker Devices: Bed  rails Bed/Chair Transfer: 3: Bed > Chair or W/C: Mod A (lift or lower assist);4: Supine > Sit: Min A (steadying Pt. > 75%/lift 1 leg)  FIM - Locomotion: Wheelchair Distance: 100 Locomotion: Wheelchair: 5: Travels 150 ft or more: maneuvers on rugs and over door sills with supervision, cueing or coaxing FIM - Locomotion: Ambulation Locomotion: Ambulation Assistive Devices: TEFL teacher;Walker - Rolling (ACE L foot/ankle) Ambulation/Gait Assistance: 1: +2 Total assist Locomotion: Ambulation: 1: Travels less than 50 ft with maximal assistance (Pt: 25 - 49%)  Comprehension Comprehension Mode: Auditory Comprehension: 4-Understands basic 75 - 89% of the time/requires cueing 10 - 24% of the time  Expression Expression Mode: Verbal Expression: 5-Expresses basic 90% of the time/requires cueing < 10% of the time.  Social Interaction Social Interaction: 4-Interacts appropriately 75 - 89% of the time - Needs redirection for appropriate language or to initiate interaction.  Problem Solving Problem Solving: 3-Solves basic 50 - 74% of the time/requires cueing 25 - 49% of the time  Memory Memory: 3-Recognizes or recalls 50 - 74% of the time/requires cueing 25 - 49% of the time  Medical Problem List and Plan:  1. DVT Prophylaxis/Anticoagulation: Pharmaceutical: Lovenox  2. Pain Management: Prn ultram for headaches.  3. Mood: will need a lot of ego support as high levels of frustration noted. Xanax prn neuropsych assessment and recs would be helpful as well.  4. Neuropsych: This patient is capable of making decisions on his/her own behalf.  5. STEMI/Non-obstructiveCAD: Treated medically. Ace initiated for BP control.  6. Non-ischemic CM: HTN v/s CVA related.  7. Borderline DM :Hgb A1C at 6.8. Continue CBG checks AC/HS with SSI. May need oral agent.  8. HTN: Will monitor with bid checks. Cozaar not covered by $4 plan will trial Lisinopril may also need HCTZ  9. Dyslipidemia: continue lipitor.  But will need pravachol at home due to $4 plan 10. Leucocytosis:  UA/UCS -, afeb monitor   LOS (Days) 9 A FACE TO FACE EVALUATION WAS PERFORMED  Elroy Schembri E 07/10/2012, 7:14 AM

## 2012-07-11 ENCOUNTER — Inpatient Hospital Stay (HOSPITAL_COMMUNITY): Payer: Medicaid - Out of State | Admitting: Occupational Therapy

## 2012-07-11 DIAGNOSIS — I10 Essential (primary) hypertension: Secondary | ICD-10-CM

## 2012-07-11 DIAGNOSIS — G8110 Spastic hemiplegia affecting unspecified side: Secondary | ICD-10-CM

## 2012-07-11 DIAGNOSIS — I6330 Cerebral infarction due to thrombosis of unspecified cerebral artery: Secondary | ICD-10-CM

## 2012-07-11 DIAGNOSIS — Z5189 Encounter for other specified aftercare: Secondary | ICD-10-CM

## 2012-07-11 LAB — GLUCOSE, CAPILLARY
Glucose-Capillary: 109 mg/dL — ABNORMAL HIGH (ref 70–99)
Glucose-Capillary: 139 mg/dL — ABNORMAL HIGH (ref 70–99)
Glucose-Capillary: 123 mg/dL — ABNORMAL HIGH (ref 70–99)

## 2012-07-11 MED ORDER — LISINOPRIL 20 MG PO TABS
20.0000 mg | ORAL_TABLET | Freq: Two times a day (BID) | ORAL | Status: DC
  Administered 2012-07-11 – 2012-07-22 (×22): 20 mg via ORAL
  Filled 2012-07-11 (×25): qty 1

## 2012-07-11 NOTE — Progress Notes (Signed)
Occupational Therapy Session Note  Patient Details  Name: Glenn Maldonado MRN: [id number] Date of Birth: 1961-02-10  Today's Date: 07/11/2012 Time: 1914-7829 Time Calculation (min): 15 min  Second Session: Time:  1400-1500 Time Calculation (min):  60 min   Skilled Therapeutic Interventions/Progress Updates:    Session 1:  Self feeding group with focus on selective attention and LUE functional use as a stabilizer.  Pt able to stabilize his dessert glass with min facilitation once his hand was placed around the container.  Also able to maintain focus on swallowing precautions with mod questioning cues.  Session 2:  Neuro UE group with focus on LUE use.  Pt able to initiate shoulder flexion and elbow extension for reaching across the table with min facilitation.  Demonstrates increased flexor tone in digits however and need max assist for grasping card or releasing cards.  Pt engaged in activity.  Encouraged him to work on stretching his fingers into extension in between his turns.  Therapy Documentation Precautions:  Precautions Precautions: Fall Precaution Comments: Left side hemiparesis Restrictions Weight Bearing Restrictions: No  ADL: See FIM for current functional status  Therapy/Group: Group Therapy both sessions  [name] OTR/L 07/11/2012, 4:42 PM

## 2012-07-11 NOTE — Progress Notes (Signed)
Speech Language Pathology Daily Session Note  Patient Details  Name: Glenn Maldonado MRN: [id number] Date of Birth: 02-Sep-1961  Today's Date: 07/11/2012 Time: 1200-1215 Time Calculation (min): 15 min  Short Term Goals: Week 2: SLP Short Term Goal 1 (Week 2): Patient will consume Dys.2 textures and honey-thick liquids with supervision level verbal cues to utilize compensatory strategies which prevent overt s/s of aspiraiton  SLP Short Term Goal 2 (Week 2): Patient will perform pharyngeal strengthening and diaphragmatic breahting exercises with min assist verbal and visual cues. SLP Short Term Goal 3 (Week 2): Patient will monitor and self correct speech intelligibility during conversation with modified independence.  SLP Short Term Goal 4 (Week 2): Patient will solve moderately complex problems with supervision level verbal cues.  Skilled Therapeutic Interventions: Co-treatment session with OT; SLP facilitated session with min assist verbal cues to utilize safe swallow compensatory strategies: 2 swallows with intermittent hard throat clear/cough, while consuming regular textures and thin liquids via cup. Pt with coughing episode X 1 due to large sip of thin liquids via cup.    FIM:  FIM - Eating Eating Activity: 5: Supervision/cues;5: Set-up assist for open containers  Pain No/Denies Pain  Therapy/Group: Group Therapy  Karyl Sharrar 07/11/2012, 4:07 PM

## 2012-07-11 NOTE — Progress Notes (Addendum)
Patient ID: Glenn Maldonado, male   DOB: 17-Oct-1961, 51 y.o.   MRN: [id number]  Subjective/Complaints: Spasms LLE at noc. Right jaw pain per chart. Slept well lat night A 12 point review of systems has been performed and if not noted above is otherwise negative.   Objective: Vital Signs: Blood pressure 160/85, pulse 78, temperature 97.6 F (36.4 C), temperature source Oral, resp. rate 18, height 5\' 10"  (1.778 m), weight 79.1 kg (174 lb 6.1 oz), SpO2 90.00%. Dg Swallowing Func-no Report  07/10/2012  CLINICAL DATA: assess for silent aspiration   FLUOROSCOPY FOR SWALLOWING FUNCTION STUDY:  Fluoroscopy was provided for swallowing function study, which was  administered by a speech pathologist.  Final results and recommendations  from this study are contained within the speech pathology report.     Results for orders placed during the hospital encounter of 07/01/12 (from the past 72 hour(s))  GLUCOSE, CAPILLARY     Status: Abnormal   Collection Time   07/08/12  8:01 AM      Component Value Range Comment   Glucose-Capillary 124 (*) 70 - 99 mg/dL    Comment 1 Notify RN     CBC WITH DIFFERENTIAL     Status: Abnormal   Collection Time   07/08/12  8:38 AM      Component Value Range Comment   WBC 12.0 (*) 4.0 - 10.5 K/uL    RBC 5.23  4.22 - 5.81 MIL/uL    Hemoglobin 14.9  13.0 - 17.0 g/dL    HCT 95.6  21.3 - 08.6 %    MCV 84.7  78.0 - 100.0 fL    MCH 28.5  26.0 - 34.0 pg    MCHC 33.6  30.0 - 36.0 g/dL    RDW 57.8  46.9 - 62.9 %    Platelets 257  150 - 400 K/uL    Neutrophils Relative 64  43 - 77 %    Neutro Abs 7.7  1.7 - 7.7 K/uL    Lymphocytes Relative 25  12 - 46 %    Lymphs Abs 3.0  0.7 - 4.0 K/uL    Monocytes Relative 7  3 - 12 %    Monocytes Absolute 0.8  0.1 - 1.0 K/uL    Eosinophils Relative 4  0 - 5 %    Eosinophils Absolute 0.5  0.0 - 0.7 K/uL    Basophils Relative 1  0 - 1 %    Basophils Absolute 0.1  0.0 - 0.1 K/uL   GLUCOSE, CAPILLARY     Status: Abnormal   Collection Time   07/08/12 11:22 AM      Component Value Range Comment   Glucose-Capillary 142 (*) 70 - 99 mg/dL    Comment 1 Notify RN     GLUCOSE, CAPILLARY     Status: Abnormal   Collection Time   07/08/12  5:28 PM      Component Value Range Comment   Glucose-Capillary 120 (*) 70 - 99 mg/dL   GLUCOSE, CAPILLARY     Status: Normal   Collection Time   07/08/12  9:13 PM      Component Value Range Comment   Glucose-Capillary 88  70 - 99 mg/dL   GLUCOSE, CAPILLARY     Status: Abnormal   Collection Time   07/09/12  8:46 AM      Component Value Range Comment   Glucose-Capillary 108 (*) 70 - 99 mg/dL    Comment 1 Documented in Chart  Comment 2 Notify RN     GLUCOSE, CAPILLARY     Status: Abnormal   Collection Time   07/09/12 11:57 AM      Component Value Range Comment   Glucose-Capillary 112 (*) 70 - 99 mg/dL   GLUCOSE, CAPILLARY     Status: Normal   Collection Time   07/09/12  5:00 PM      Component Value Range Comment   Glucose-Capillary 89  70 - 99 mg/dL   GLUCOSE, CAPILLARY     Status: Abnormal   Collection Time   07/09/12  9:08 PM      Component Value Range Comment   Glucose-Capillary 163 (*) 70 - 99 mg/dL   GLUCOSE, CAPILLARY     Status: Normal   Collection Time   07/10/12  7:23 AM      Component Value Range Comment   Glucose-Capillary 99  70 - 99 mg/dL    Comment 1 Documented in Chart      Comment 2 Notify RN     GLUCOSE, CAPILLARY     Status: Abnormal   Collection Time   07/10/12 11:39 AM      Component Value Range Comment   Glucose-Capillary 139 (*) 70 - 99 mg/dL    Comment 1 Notify RN     GLUCOSE, CAPILLARY     Status: Abnormal   Collection Time   07/10/12  4:54 PM      Component Value Range Comment   Glucose-Capillary 112 (*) 70 - 99 mg/dL   GLUCOSE, CAPILLARY     Status: Abnormal   Collection Time   07/10/12  9:51 PM      Component Value Range Comment   Glucose-Capillary 149 (*) 70 - 99 mg/dL    Comment 1 Notify RN        HEENT: poor dentition Cardio: RRR Resp: CTA B/L after clears  with cough otherwise upper airway sounds GI: BS positive Extremity:  Edema mild L dorsum hand Skin:   Intact Neuro: Lethargic, Cranial Nerve Abnormalities R central 7, Abnormal Motor 1/5 L FF,2/5 L deltoid, biceps and triceps, Abnormal FMC Ataxic/ dec FMC and Tone  increased extensor tone in LUE, Tone:  Hypertonia and Dysarthric Musc/Skel:  Normal   Assessment/Plan: 1. 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 Physiatrist is providing close team supervision and 24 hour management of active medical problems listed below. Physiatrist and rehab team continue to assess barriers to discharge/monitor patient progress toward functional and medical goals. FIM: FIM - Bathing Bathing Steps Patient Completed: Front perineal area;Buttocks Bathing: 4: Steadying assist  FIM - Upper Body Dressing/Undressing Upper body dressing/undressing steps patient completed: Thread/unthread right sleeve of pullover shirt/dresss;Thread/unthread left sleeve of pullover shirt/dress;Put head through opening of pull over shirt/dress;Pull shirt over trunk Upper body dressing/undressing: 5: Set-up assist to: Obtain clothing/put away FIM - Lower Body Dressing/Undressing Lower body dressing/undressing steps patient completed: Thread/unthread right pants leg;Thread/unthread left pants leg;Pull pants up/down;Don/Doff right sock;Don/Doff left sock;Don/Doff right shoe;Don/Doff left shoe Lower body dressing/undressing: 4: Steadying Assist  FIM - Toileting Toileting: 0: Activity did not occur  FIM - Archivist Transfers: 0-Activity did not occur  FIM - Banker Devices: Bed rails Bed/Chair Transfer: 2: Chair or W/C > Bed: Max A (lift and lower assist);3: Bed > Chair or W/C: Mod A (lift or lower assist);5: Sit > Supine: Supervision (verbal cues/safety  issues)  FIM - Locomotion: Wheelchair Distance: 100 Locomotion: Wheelchair: 0: Activity did not occur FIM - Locomotion: Ambulation Locomotion: Ambulation Assistive Devices: TEFL teacher;Walker - Rolling Ambulation/Gait Assistance: 1: +2 Total assist Locomotion: Ambulation: 1: Travels less than 50 ft with moderate assistance (Pt: 50 - 74%)  Comprehension Comprehension Mode: Auditory Comprehension: 4-Understands basic 75 - 89% of the time/requires cueing 10 - 24% of the time  Expression Expression Mode: Verbal Expression: 5-Expresses basic needs/ideas: With extra time/assistive device  Social Interaction Social Interaction: 6-Interacts appropriately with others with medication or extra time (anti-anxiety, antidepressant).  Problem Solving Problem Solving: 4-Solves basic 75 - 89% of the time/requires cueing 10 - 24% of the time  Memory Memory: 5-Recognizes or recalls 90% of the time/requires cueing < 10% of the time  Medical Problem List and Plan:  1. DVT Prophylaxis/Anticoagulation: Pharmaceutical: Lovenox  2. Pain Management: Prn ultram for headaches. tylenol 3. Mood: will need a lot of ego support as high levels of frustration noted. Xanax prn neuropsych assessment and recs would be helpful as well.  4. Neuropsych: This patient is capable of making decisions on his/her own behalf.  5. STEMI/Non-obstructiveCAD: Treated medically. Ace initiated for BP control.  6. Non-ischemic CM: HTN v/s CVA related.  7. Borderline DM :Hgb A1C at 6.8. Continue CBG checks AC/HS with SSI. So far sugars are reaonable 8. HTN: Will monitor with bid checks. Increase lisinopril to 19m bid.  may also need HCTZ  9. Dyslipidemia: continue lipitor. But will need pravachol at home due to $4 plan 10. Leucocytosis:  UA/[facility] -, afeb monitor   LOS (Days) 10 A FACE TO FACE EVALUATION WAS PERFORMED  Tokiko Diefenderfer T 07/11/2012, 5:28 AM

## 2012-07-12 ENCOUNTER — Inpatient Hospital Stay (HOSPITAL_COMMUNITY): Payer: Medicaid - Out of State | Admitting: *Deleted

## 2012-07-12 LAB — GLUCOSE, CAPILLARY

## 2012-07-12 NOTE — Progress Notes (Signed)
Occupational Therapy Note  Patient Details  Name: Glenn Maldonado MRN: [id number] Date of Birth: 06-26-1961 Today's Date: 07/12/2012  1430-1515  (45 min) Individual therapy Pain:  3/10 Left medial scapula  Pt lying in bed upon OT arrival.  Pt. Sat EOB with supervision level.  Addressed neuromuscular re education on Left scapula, shoulder.  Did myotherapy around all scapula borders.  Practiced sit to stand and standing balance with light weight bearing on LUE/LLE.  Pt. Stood for 10 -12 minutes while addressing neck and shoulder AAROM and deep manual fascia stretching.  Pt. Reported his pain decreased by the end of the session from 3 to 0.    Humberto Seals 07/12/2012, 5:39 PM

## 2012-07-12 NOTE — Progress Notes (Signed)
Patient ID: Glenn Maldonado, male   DOB: 17-Dec-1960, 51 y.o.   MRN: [id number]  Subjective/Complaints: Slept well. No complaints today. A 12 point review of systems has been performed and if not noted above is otherwise negative.   Objective: Vital Signs: Blood pressure 160/70, pulse 50, temperature 97.8 F (36.6 C), temperature source Oral, resp. rate 18, height 5\' 10"  (1.778 m), weight 79.1 kg (174 lb 6.1 oz), SpO2 94.00%. Dg Swallowing Func-no Report  07/10/2012  CLINICAL DATA: assess for silent aspiration   FLUOROSCOPY FOR SWALLOWING FUNCTION STUDY:  Fluoroscopy was provided for swallowing function study, which was  administered by a speech pathologist.  Final results and recommendations  from this study are contained within the speech pathology report.     Results for orders placed during the hospital encounter of 07/01/12 (from the past 72 hour(s))  GLUCOSE, CAPILLARY     Status: Abnormal   Collection Time   07/09/12  8:46 AM      Component Value Range Comment   Glucose-Capillary 108 (*) 70 - 99 mg/dL    Comment 1 Documented in Chart      Comment 2 Notify RN     GLUCOSE, CAPILLARY     Status: Abnormal   Collection Time   07/09/12 11:57 AM      Component Value Range Comment   Glucose-Capillary 112 (*) 70 - 99 mg/dL   GLUCOSE, CAPILLARY     Status: Normal   Collection Time   07/09/12  5:00 PM      Component Value Range Comment   Glucose-Capillary 89  70 - 99 mg/dL   GLUCOSE, CAPILLARY     Status: Abnormal   Collection Time   07/09/12  9:08 PM      Component Value Range Comment   Glucose-Capillary 163 (*) 70 - 99 mg/dL   GLUCOSE, CAPILLARY     Status: Normal   Collection Time   07/10/12  7:23 AM      Component Value Range Comment   Glucose-Capillary 99  70 - 99 mg/dL    Comment 1 Documented in Chart      Comment 2 Notify RN     GLUCOSE, CAPILLARY     Status: Abnormal   Collection Time   07/10/12 11:39 AM      Component Value Range Comment   Glucose-Capillary 139 (*) 70 - 99 mg/dL      Comment 1 Notify RN     GLUCOSE, CAPILLARY     Status: Abnormal   Collection Time   07/10/12  4:54 PM      Component Value Range Comment   Glucose-Capillary 112 (*) 70 - 99 mg/dL   GLUCOSE, CAPILLARY     Status: Abnormal   Collection Time   07/10/12  9:51 PM      Component Value Range Comment   Glucose-Capillary 149 (*) 70 - 99 mg/dL    Comment 1 Notify RN     GLUCOSE, CAPILLARY     Status: Abnormal   Collection Time   07/11/12  7:40 AM      Component Value Range Comment   Glucose-Capillary 109 (*) 70 - 99 mg/dL    Comment 1 Documented in Chart      Comment 2 Notify RN     GLUCOSE, CAPILLARY     Status: Abnormal   Collection Time   07/11/12 11:35 AM      Component Value Range Comment   Glucose-Capillary 139 (*) 70 - 99 mg/dL  Comment 1 Notify RN     GLUCOSE, CAPILLARY     Status: Abnormal   Collection Time   07/11/12  4:47 PM      Component Value Range Comment   Glucose-Capillary 107 (*) 70 - 99 mg/dL    Comment 1 Notify RN     GLUCOSE, CAPILLARY     Status: Abnormal   Collection Time   07/11/12 10:12 PM      Component Value Range Comment   Glucose-Capillary 123 (*) 70 - 99 mg/dL    Comment 1 Notify RN        HEENT: poor dentition Cardio: RRR Resp: CTA B/L after clears with cough otherwise upper airway sounds GI: BS positive Extremity:  Edema mild L dorsum hand Skin:   Intact Neuro: Lethargic, Cranial Nerve Abnormalities R central 7, Abnormal Motor 1/5 L FF,2/5 L deltoid, biceps and triceps, Abnormal FMC Ataxic/ dec FMC and Tone  increased extensor tone in LUE, Tone:  Hypertonia and Dysarthric Musc/Skel:  Normal   Assessment/Plan: 1. 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 Physiatrist is providing close team supervision and 24 hour management of active medical problems listed below. Physiatrist and rehab team continue to assess barriers to  discharge/monitor patient progress toward functional and medical goals. FIM: FIM - Bathing Bathing Steps Patient Completed: Front perineal area;Buttocks Bathing: 4: Steadying assist  FIM - Upper Body Dressing/Undressing Upper body dressing/undressing steps patient completed: Thread/unthread right sleeve of pullover shirt/dresss;Thread/unthread left sleeve of pullover shirt/dress;Put head through opening of pull over shirt/dress;Pull shirt over trunk Upper body dressing/undressing: 5: Set-up assist to: Obtain clothing/put away FIM - Lower Body Dressing/Undressing Lower body dressing/undressing steps patient completed: Thread/unthread right pants leg;Thread/unthread left pants leg;Pull pants up/down;Don/Doff right sock;Don/Doff left sock;Don/Doff right shoe;Don/Doff left shoe Lower body dressing/undressing: 4: Steadying Assist  FIM - Toileting Toileting: 0: Activity did not occur  FIM - Archivist Transfers: 0-Activity did not occur  FIM - Banker Devices: Bed rails Bed/Chair Transfer: 2: Chair or W/C > Bed: Max A (lift and lower assist);3: Bed > Chair or W/C: Mod A (lift or lower assist);5: Sit > Supine: Supervision (verbal cues/safety issues)  FIM - Locomotion: Wheelchair Distance: 100 Locomotion: Wheelchair: 0: Activity did not occur FIM - Locomotion: Ambulation Locomotion: Ambulation Assistive Devices: TEFL teacher;Walker - Rolling Ambulation/Gait Assistance: 1: +2 Total assist Locomotion: Ambulation: 1: Travels less than 50 ft with moderate assistance (Pt: 50 - 74%)  Comprehension Comprehension Mode: Auditory Comprehension: 4-Understands basic 75 - 89% of the time/requires cueing 10 - 24% of the time  Expression Expression Mode: Verbal Expression: 5-Expresses basic needs/ideas: With extra time/assistive device  Social Interaction Social Interaction: 6-Interacts appropriately with others with medication or extra time  (anti-anxiety, antidepressant).  Problem Solving Problem Solving: 4-Solves basic 75 - 89% of the time/requires cueing 10 - 24% of the time  Memory Memory: 5-Recognizes or recalls 90% of the time/requires cueing < 10% of the time  Medical Problem List and Plan:  1. DVT Prophylaxis/Anticoagulation: Pharmaceutical: Lovenox  2. Pain Management: Prn ultram for headaches. tylenol 3. Mood: will need a lot of ego support as high levels of frustration noted. Xanax prn neuropsych assessment and recs would be helpful as well.  4. Neuropsych: This patient is capable of making decisions on his/her own behalf.  5. STEMI/Non-obstructiveCAD: Treated medically. Ace initiated for BP control.  6. Non-ischemic CM:  HTN v/s CVA related.  7. Borderline DM :Hgb A1C at 6.8. Continue CBG checks AC/HS with SSI. So far sugars are reaonable 8. HTN:   Increasef lisinopril to 108m bid with some improvement.  may also need HCTZ  9. Dyslipidemia: continue lipitor. But will need pravachol at home due to $4 plan 10. Leucocytosis:  UA/[facility] -, afeb monitor   LOS (Days) 11 A FACE TO FACE EVALUATION WAS PERFORMED  SWARTZ,ZACHARY T 07/12/2012, 6:23 AM

## 2012-07-13 ENCOUNTER — Inpatient Hospital Stay (HOSPITAL_COMMUNITY): Payer: Medicaid - Out of State | Admitting: Speech Pathology

## 2012-07-13 ENCOUNTER — Inpatient Hospital Stay (HOSPITAL_COMMUNITY): Payer: Medicaid - Out of State

## 2012-07-13 ENCOUNTER — Inpatient Hospital Stay (HOSPITAL_COMMUNITY): Payer: Medicaid - Out of State | Admitting: Physical Therapy

## 2012-07-13 ENCOUNTER — Inpatient Hospital Stay (HOSPITAL_COMMUNITY): Payer: Medicaid - Out of State | Admitting: Occupational Therapy

## 2012-07-13 DIAGNOSIS — I6330 Cerebral infarction due to thrombosis of unspecified cerebral artery: Secondary | ICD-10-CM

## 2012-07-13 DIAGNOSIS — G8110 Spastic hemiplegia affecting unspecified side: Secondary | ICD-10-CM

## 2012-07-13 DIAGNOSIS — Z5189 Encounter for other specified aftercare: Secondary | ICD-10-CM

## 2012-07-13 DIAGNOSIS — I10 Essential (primary) hypertension: Secondary | ICD-10-CM

## 2012-07-13 LAB — GLUCOSE, CAPILLARY: Glucose-Capillary: 82 mg/dL (ref 70–99)

## 2012-07-13 NOTE — Progress Notes (Signed)
Occupational Therapy Session Note  Patient Details  Name: Glenn Maldonado MRN: [id number] Date of Birth: December 15, 1960  Today's Date: 07/13/2012 Time: 1130-1145 Time Calculation (min): 15 min  Skilled Therapeutic Interventions/Progress Updates: Patient participated in [employer] today.  Patient concerned regarding lack of feeling in left cheek, and occasionally he bites his tongue / lip.  Patient with much stronger cough to clear throat.  Patient issued rocker knife, and demonstrated safe and effective use to cut food.       Therapy Documentation Precautions:  Precautions Precautions: Fall Precaution Comments: Left side hemiparesis Restrictions Weight Bearing Restrictions: No   Pain: No report of pain today    See FIM for current functional status  Therapy/Group: Group Therapy  [name] 07/13/2012, 1:25 PM

## 2012-07-13 NOTE — Progress Notes (Signed)
Speech Language Pathology Daily Session Note  Patient Details  Name: Glenn Maldonado MRN: [id number] Date of Birth: [birth year]  Today's Date: 07/13/2012 Time: 1145-1200 Time Calculation (min): 15 min  Short Term Goals: Week 2: SLP Short Term Goal 1 (Week 2): Patient will consume Dys.2 textures and honey-thick liquids with supervision level verbal cues to utilize compensatory strategies which prevent overt s/s of aspiraiton  SLP Short Term Goal 2 (Week 2): Patient will perform pharyngeal strengthening and diaphragmatic breahting exercises with min assist verbal and visual cues. SLP Short Term Goal 3 (Week 2): Patient will monitor and self correct speech intelligibility during conversation with modified independence.  SLP Short Term Goal 4 (Week 2): Patient will solve moderately complex problems with supervision level verbal cues.  Skilled Therapeutic Interventions: Co-treatment session with OT; SLP facilitated session with supervision level verbal cues to utilize safe swallow compensatory strategies: 2 swallows with intermittent hard throat clear/cough, while consuming regular textures and thin liquids via cup. Patient reported being ill earlier in the day, but participated and displayed no overt s/s of aspiration during session.    Pain Pain Assessment Pain Assessment: No/denies pain  Therapy/Group: Group Therapy  Charlane Ferretti., CCC-SLP [phone]  [name] 07/13/2012, 1:28 PM

## 2012-07-13 NOTE — Progress Notes (Signed)
Patient ID: Glenn Maldonado, male   DOB: 17-Jul-1961, 50 y.o.   MRN: [id number]  Subjective/Complaints: Spasms LLE improving Objective: Vital Signs: Blood pressure 130/66, pulse 74, temperature 97.7 F (36.5 C), temperature source Oral, resp. rate 18, height 5\' 10"  (1.778 m), weight 79.1 kg (174 lb 6.1 oz), SpO2 99.00%. No results found. Results for orders placed during the hospital encounter of 07/01/12 (from the past 72 hour(s))  GLUCOSE, CAPILLARY     Status: Abnormal   Collection Time   07/10/12 11:39 AM      Component Value Range Comment   Glucose-Capillary 139 (*) 70 - 99 mg/dL    Comment 1 Notify RN     GLUCOSE, CAPILLARY     Status: Abnormal   Collection Time   07/10/12  4:54 PM      Component Value Range Comment   Glucose-Capillary 112 (*) 70 - 99 mg/dL   GLUCOSE, CAPILLARY     Status: Abnormal   Collection Time   07/10/12  9:51 PM      Component Value Range Comment   Glucose-Capillary 149 (*) 70 - 99 mg/dL    Comment 1 Notify RN     GLUCOSE, CAPILLARY     Status: Abnormal   Collection Time   07/11/12  7:40 AM      Component Value Range Comment   Glucose-Capillary 109 (*) 70 - 99 mg/dL    Comment 1 Documented in Chart      Comment 2 Notify RN     GLUCOSE, CAPILLARY     Status: Abnormal   Collection Time   07/11/12 11:35 AM      Component Value Range Comment   Glucose-Capillary 139 (*) 70 - 99 mg/dL    Comment 1 Notify RN     GLUCOSE, CAPILLARY     Status: Abnormal   Collection Time   07/11/12  4:47 PM      Component Value Range Comment   Glucose-Capillary 107 (*) 70 - 99 mg/dL    Comment 1 Notify RN     GLUCOSE, CAPILLARY     Status: Abnormal   Collection Time   07/11/12 10:12 PM      Component Value Range Comment   Glucose-Capillary 123 (*) 70 - 99 mg/dL    Comment 1 Notify RN     GLUCOSE, CAPILLARY     Status: Normal   Collection Time   07/12/12  7:32 AM      Component Value Range Comment   Glucose-Capillary 95  70 - 99 mg/dL    Comment 1 Notify RN     GLUCOSE,  CAPILLARY     Status: Abnormal   Collection Time   07/12/12 11:28 AM      Component Value Range Comment   Glucose-Capillary 100 (*) 70 - 99 mg/dL    Comment 1 Notify RN     GLUCOSE, CAPILLARY     Status: Abnormal   Collection Time   07/12/12  4:41 PM      Component Value Range Comment   Glucose-Capillary 193 (*) 70 - 99 mg/dL    Comment 1 Notify RN     GLUCOSE, CAPILLARY     Status: Normal   Collection Time   07/12/12  9:40 PM      Component Value Range Comment   Glucose-Capillary 79  70 - 99 mg/dL    Comment 1 Documented in Chart      Comment 2 Notify RN     GLUCOSE, CAPILLARY  Status: Normal   Collection Time   07/13/12  7:17 AM      Component Value Range Comment   Glucose-Capillary 82  70 - 99 mg/dL    Comment 1 Notify RN        HEENT: poor dentition Cardio: RRR Resp: CTA B/L after clears with cough otherwise upper airway sounds GI: BS positive Extremity:  Edema mild L dorsum hand Skin:   Intact Neuro: Lethargic, Cranial Nerve Abnormalities R central 7, Abnormal Motor 1/5 L FF,2/5 L deltoid, biceps and triceps,LE 2-/5 hip knee extensor synergy, trace toes Abnormal FMC Ataxic/ dec FMC and Tone  increased extensor tone in LUE, Tone:  Hypertonia and Dysarthric Musc/Skel:  Normal   Assessment/Plan: 1. 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 Physiatrist is providing close team supervision and 24 hour management of active medical problems listed below. Physiatrist and rehab team continue to assess barriers to discharge/monitor patient progress toward functional and medical goals. FIM: FIM - Bathing Bathing Steps Patient Completed: Front perineal area;Buttocks Bathing: 4: Steadying assist  FIM - Upper Body Dressing/Undressing Upper body dressing/undressing steps patient completed: Thread/unthread right sleeve of pullover shirt/dresss;Thread/unthread left  sleeve of pullover shirt/dress;Put head through opening of pull over shirt/dress;Pull shirt over trunk Upper body dressing/undressing: 5: Set-up assist to: Obtain clothing/put away FIM - Lower Body Dressing/Undressing Lower body dressing/undressing steps patient completed: Thread/unthread right pants leg;Thread/unthread left pants leg;Pull pants up/down;Don/Doff right sock;Don/Doff left sock;Don/Doff right shoe;Don/Doff left shoe Lower body dressing/undressing: 4: Steadying Assist  FIM - Toileting Toileting: 0: Activity did not occur  FIM - Archivist Transfers: 0-Activity did not occur  FIM - Banker Devices: Bed rails Bed/Chair Transfer: 2: Chair or W/C > Bed: Max A (lift and lower assist);3: Bed > Chair or W/C: Mod A (lift or lower assist);5: Sit > Supine: Supervision (verbal cues/safety issues)  FIM - Locomotion: Wheelchair Distance: 100 Locomotion: Wheelchair: 0: Activity did not occur FIM - Locomotion: Ambulation Locomotion: Ambulation Assistive Devices: TEFL teacher;Walker - Rolling Ambulation/Gait Assistance: 1: +2 Total assist Locomotion: Ambulation: 1: Travels less than 50 ft with moderate assistance (Pt: 50 - 74%)  Comprehension Comprehension Mode: Auditory Comprehension: 4-Understands basic 75 - 89% of the time/requires cueing 10 - 24% of the time  Expression Expression Mode: Verbal Expression: 5-Expresses basic needs/ideas: With extra time/assistive device  Social Interaction Social Interaction: 6-Interacts appropriately with others with medication or extra time (anti-anxiety, antidepressant).  Problem Solving Problem Solving: 4-Solves basic 75 - 89% of the time/requires cueing 10 - 24% of the time  Memory Memory: 5-Recognizes or recalls 90% of the time/requires cueing < 10% of the time  Medical Problem List and Plan:  1. DVT Prophylaxis/Anticoagulation: Pharmaceutical: Lovenox  2. Pain Management: Prn  ultram for headaches.  3. Mood: will need a lot of ego support as high levels of frustration noted. Xanax prn neuropsych assessment and recs would be helpful as well.  4. Neuropsych: This patient is capable of making decisions on his/her own behalf.  5. STEMI/Non-obstructiveCAD: Treated medically. Ace initiated for BP control.  6. Non-ischemic CM: HTN v/s CVA related.  7. Borderline DM :Hgb A1C at 6.8. Continue CBG checks AC/HS with SSI. May need oral agent.  8. HTN: Will monitor with bid checks. Cozaar not covered by $4 plan will trial Lisinopril may also need HCTZ  9. Dyslipidemia: continue lipitor. But will need pravachol at  home due to $4 plan 10. Leucocytosis:  UA/UCS -, afeb monitor   LOS (Days) 12 A FACE TO FACE EVALUATION WAS PERFORMED  Chieko Neises E 07/13/2012, 7:39 AM

## 2012-07-13 NOTE — Progress Notes (Signed)
Occupational Therapy Session Note  Patient Details  Name: Glenn Maldonado MRN: [id number] Date of Birth: 1961/02/08  Today's Date: 07/13/2012 Time: 0730-0830 Time Calculation (min): 60 min   Short Term Goals: Week 2:  OT Short Term Goal 1 (Week 2): LTG= STG  Skilled Therapeutic Interventions/Progress Updates:  ADL re-training session performed in shower this AM . ADL re-training session with focus on standing balance, sitting balance, hemi-dressing techniques. Patient required vc's and physical assist for proper left foot placement before transfers. Patient reported fatigue this morning and needed increased assistance to pull pants up over hips. Dr. Otho Najjar would like patient to wear resting hand splint during the night.     Therapy Documentation Precautions:  Precautions Precautions: Fall Precaution Comments: Left side hemiparesis Restrictions Weight Bearing Restrictions: No Pain: Pain Assessment Pain Assessment: No/denies pain   See FIM for current functional status  Therapy/Group: Individual Therapy  Limmie Patricia, OTR/L 07/13/2012, 8:26 AM

## 2012-07-13 NOTE — Progress Notes (Signed)
Speech Language Pathology Daily Session Note  Patient Details  Name: Glenn Maldonado MRN: [id number] Date of Birth: [birth year]  Today's Date: 07/13/2012 Time: 1400-1445 Time Calculation (min): 45 min  Short Term Goals: Week 2: SLP Short Term Goal 1 (Week 2): Patient will consume Dys.2 textures and honey-thick liquids with supervision level verbal cues to utilize compensatory strategies which prevent overt s/s of aspiraiton  SLP Short Term Goal 2 (Week 2): Patient will perform pharyngeal strengthening and diaphragmatic breahting exercises with min assist verbal and visual cues. SLP Short Term Goal 3 (Week 2): Patient will monitor and self correct speech intelligibility during conversation with modified independence.  SLP Short Term Goal 4 (Week 2): Patient will solve moderately complex problems with supervision level verbal cues.  Skilled Therapeutic Interventions: Treatment session focused on addressing cognition and speech intelligibility.  SLP provided moderate assist to recall names and function of current medications.   During session patient verbally expressed concern regarding right sided labial weakness especially when he smile.  SLP facilitated session with mod assist verbal and visual cues to return demonstration of oral motor exercises focused on addressing labial range of motion and strength.  Patient also demonstrated difficulty supporting his speech with adequate breath support and as a result SLP will address diaphragmatic breathing during next session.       FIM:  Comprehension Comprehension Mode: Auditory Comprehension: 5-Follows basic conversation/direction: With extra time/assistive device Expression Expression Mode: Verbal Expression: 5-Expresses complex 90% of the time/cues < 10% of the time Social Interaction Social Interaction: 6-Interacts appropriately with others with medication or extra time (anti-anxiety, antidepressant). Problem Solving Problem Solving: 5-Solves  basic 90% of the time/requires cueing < 10% of the time Memory Memory: 5-Requires cues to use assistive device FIM - Eating Eating Activity: 5: Supervision/cues;5: Set-up assist for open containers;5: Set-up assist for cut food  Pain Pain Assessment Pain Assessment: No/denies pain  Therapy/Group: Individual Therapy  Charlane Ferretti., CCC-SLP [phone]  Glenn Maldonado 07/13/2012, 4:10 PM

## 2012-07-13 NOTE — Progress Notes (Addendum)
Physical Therapy Vestibular Evaluation  Patient Details  Name: Glenn Maldonado MRN: [id number] Date of Birth: [birth year]   Today's Date: 07/13/2012 Time: 4098-1191 Time Calculation (min): 70 min  Problem List:  Patient Active Problem List  Diagnosis  . 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 HTN (hypertension), uncontrolled  . Leg weakness, bilateral  . Tobacco abuse  . Weight loss, non-intentional  . OSA (obstructive sleep apnea), history of, but with recent weight loss less symptoms  . NICM, EF 35% at cardiac cath, 55% by echo  . CVA (cerebral infarction)    Past Medical History:  Past Medical History  Diagnosis Date  . Hypertension   . Borderline diabetic   . STEMI (ST elevation myocardial infarction) 06/28/2012  . HTN (hypertension), uncontrolled 06/28/2012  . Leg weakness, bilateral 06/28/2012  . Tobacco abuse 06/28/2012  . Weight loss, non-intentional 06/28/2012  . OSA (obstructive sleep apnea), history of, but with recent weight loss less symptoms 06/28/2012  . Hepatitis C 2001    treated with interferon  . NICM (nonischemic cardiomyopathy), EF 35% at cardiac cath 06/28/2012   Past Surgical History: No past surgical history on file.  Assessment & Plan Clinical Impression: Glenn Maldonado is a 51 y.o. RH-male with history of HTN, Hep C, admitted on 06/28/12 pm with complaints of chest pressure and difficulty walking as well as left arm heaviness with tingling sensation . MRI brain done post cardiac cath revealed acute infarcts posterior limb of right IC and parietal white matter bilaterally. Patient transferred to CIR on 07/01/2012 .  Prior to hospitalization, patient was independent with mobility and lived with Spouse in a House home. Home access is Level entry (threshold); threshold to enter. 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  Patient reports he has had dizziness prior to hospitalization -  particularly with quick head movements and with decreased somatosensory input (while standing on ladders). Pt currently reports increased dizziness with position changes and horizontal head movements which further increase his risk for falls. Vestibular evaluation performed.   Based on the findings below, pt appears to be minimally impaired by very short duration Lt. 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 Pt will benefit from vestibular rehabilitation during stay to improve balance, decrease dizziness symptoms, and decrease risk for falls.  Pt will also benefit from vestibular HH/outpatient follow up.     PT Vestibular Evaluation  Eye Alignment  WNL  Spontaneous  Nystagmus WNL  Gaze holding nystagmus WFL  Smooth pursuit WFL  Oculomotor WFL  Saccades WFL  VOR slow NT  Head Thrust Test Slightly decreased, unable to determine which side more impaired  Head Shaking Nystagmus Unable to test, no Frenzel lenses   Rt. Hallpike Dix Negative  Lt. Hallpike Dix Positive (upbeating Lt. Torsional; 3 sec duration)  Rt. Roll Test  Negative  Lt. Roll Test  +symptoms, no nystagmus  Motion sensitivity  Negative      Visual- Vestibular Interactions:  - Sitting: Significant symptom provocation in horizontal plane after 30 sec of slow x 1 exercise "Concordant Dizziness" of pt's symptoms, minimal in vertical plane - Standing: Unable to test secondary to neuromuscular impairments  - Walking: Unable to test secondary to neuromuscular impairments    Recommendations Performed canalith repositioning maneuver x 2 for Lt. Canal. Pt may benefit from [employer] exercises however he will be limited in ability to perform  exercise secondary to Lt. Hemiparesis due to stroke.   Pt will most benefit from slow progression of x1 gaze stabilization exercises in sitting (horizontal); 30 sec progressing to 60 sec with appropriate speed.  As pt able to stand safely these exercises should be incorporated with static standing. Pt may also benefit from performing in vertical plane. Pt given handout of exercises.      Pain Pain Assessment Pain Assessment: No/denies pain  See FIM for current functional status Refer to Care Plan for Long Term Goals  Recommendations for other services: None  Discharge Criteria: Patient will be discharged from PT if patient refuses treatment 3 consecutive times without medical reason, if treatment goals not met, if there is a change in medical status, if patient makes no progress towards goals or if patient is discharged from hospital.  The above assessment, treatment plan, treatment alternatives and goals were discussed and mutually agreed upon: by patient  Wilhemina Bonito 07/13/2012, 4:11 PM

## 2012-07-14 ENCOUNTER — Inpatient Hospital Stay (HOSPITAL_COMMUNITY): Payer: Medicaid - Out of State | Admitting: Speech Pathology

## 2012-07-14 ENCOUNTER — Inpatient Hospital Stay (HOSPITAL_COMMUNITY): Payer: Medicaid - Out of State | Admitting: Physical Therapy

## 2012-07-14 ENCOUNTER — Inpatient Hospital Stay (HOSPITAL_COMMUNITY): Payer: Medicaid - Out of State | Admitting: Occupational Therapy

## 2012-07-14 ENCOUNTER — Inpatient Hospital Stay (HOSPITAL_COMMUNITY): Payer: Medicaid - Out of State

## 2012-07-14 DIAGNOSIS — I10 Essential (primary) hypertension: Secondary | ICD-10-CM

## 2012-07-14 DIAGNOSIS — G8110 Spastic hemiplegia affecting unspecified side: Secondary | ICD-10-CM

## 2012-07-14 DIAGNOSIS — I6330 Cerebral infarction due to thrombosis of unspecified cerebral artery: Secondary | ICD-10-CM

## 2012-07-14 DIAGNOSIS — Z5189 Encounter for other specified aftercare: Secondary | ICD-10-CM

## 2012-07-14 LAB — GLUCOSE, CAPILLARY
Glucose-Capillary: 109 mg/dL — ABNORMAL HIGH (ref 70–99)
Glucose-Capillary: 133 mg/dL — ABNORMAL HIGH (ref 70–99)

## 2012-07-14 MED ORDER — ALBUTEROL SULFATE (5 MG/ML) 0.5% IN NEBU: 2.5000 mg | INHALATION_SOLUTION | RESPIRATORY_TRACT | Status: DC | PRN

## 2012-07-14 MED ORDER — ALBUTEROL SULFATE HFA 108 (90 BASE) MCG/ACT IN AERS
2.0000 | INHALATION_SPRAY | Freq: Two times a day (BID) | RESPIRATORY_TRACT | Status: DC
  Administered 2012-07-14 – 2012-07-22 (×16): 2 via RESPIRATORY_TRACT
  Filled 2012-07-14: qty 6.7

## 2012-07-14 NOTE — Progress Notes (Signed)
Occupational Therapy Session Note  Patient Details  Name: Glenn Maldonado MRN: [id number] Date of Birth: 09-22-1961  Today's Date: 07/14/2012 Time: 0730-0830 Time Calculation (min): 60 min   Short Term Goals: Week 2:  OT Short Term Goal 1 (Week 2): LTG= STG  Skilled Therapeutic Interventions/Progress Updates:  ADL re-training session performed at sink this AM . ADL re-training session with focus on standing balance, sitting balance, hemi-dressing techniques. Resting hand splint ordered for patient to wear during sleep. Patient did try and stand up at sink before therapist was next to patient and needed vc's to wait. Standing balance still remains Poor-. When standing, patient heavily leans towards left side. Patient able to perform left Active shoulder abduction.     Therapy Documentation Precautions:  Precautions Precautions: Fall Precaution Comments: Left side hemiparesis Restrictions Weight Bearing Restrictions: No Pain: Pain Assessment Pain Assessment: No/denies pain   See FIM for current functional status  Therapy/Group: Individual Therapy  Limmie Patricia, OTR/L 07/14/2012, 11:23 AM

## 2012-07-14 NOTE — Progress Notes (Signed)
Orthopedic Tech Progress Note Patient Details:  Odie Neher February 17, 1961 [id number] Nurse called with outside order. [employer] called at 8:35am to place order. Spoke with [employer]. Patient ID: Edell Clementi, male   DOB: 10-18-1961, 51 y.o.   MRN: [id number]   Orie Rout 07/14/2012, 8:37 AM

## 2012-07-14 NOTE — Progress Notes (Signed)
Orthopedic Tech Progress Note Patient Details:  [name] February 27, 1961 [id number]  Patient ID: Glenn Maldonado, male   DOB: November 22, 1960, 51 y.o.   MRN: [id number] Brace order completed by [facility]  Nikki Dom 07/14/2012, 3:06 PM

## 2012-07-14 NOTE — Progress Notes (Signed)
Speech Language Pathology Daily Session Note  Patient Details  Name: Glenn Maldonado MRN: [id number] Date of Birth: 02-14-1961  Today's Date: 07/14/2012 Time: 7829-5621 Time Calculation (min): 20 min  Short Term Goals: Week 2: SLP Short Term Goal 1 (Week 2): Patient will consume Dys.2 textures and honey-thick liquids with supervision level verbal cues to utilize compensatory strategies which prevent overt s/s of aspiraiton  SLP Short Term Goal 2 (Week 2): Patient will perform pharyngeal strengthening and diaphragmatic breahting exercises with min assist verbal and visual cues. SLP Short Term Goal 3 (Week 2): Patient will monitor and self correct speech intelligibility during conversation with modified independence.  SLP Short Term Goal 4 (Week 2): Patient will solve moderately complex problems with supervision level verbal cues.  Skilled Therapeutic Interventions: Co-treatment session with OT; SLP facilitated session increased wait time to utilize safe swallow compensatory strategies: 2 swallows with intermittent hard throat clear/cough, while consuming regular textures and thin liquids via cup. Patient displayed no overt s/s of aspiration during session.  As a result, it is recommended that supervision with meals change to an intermittent level.    FIM:  Comprehension Comprehension Mode: Auditory Comprehension: 5-Follows basic conversation/direction: With extra time/assistive device Expression Expression Mode: Verbal Expression: 5-Expresses basic 90% of the time/requires cueing < 10% of the time. Social Interaction Social Interaction: 6-Interacts appropriately with others with medication or extra time (anti-anxiety, antidepressant). Problem Solving Problem Solving: 5-Solves basic 90% of the time/requires cueing < 10% of the time Memory Memory: 5-Recognizes or recalls 90% of the time/requires cueing < 10% of the time FIM - Eating Eating Activity: 6: Swallowing techniques:  self-managed  Pain Pain Assessment Pain Assessment: No/denies pain  Therapy/Group: Group Therapy  Charlane Ferretti., CCC-SLP [phone]  Glenn Maldonado 07/14/2012, 1:22 PM

## 2012-07-14 NOTE — Progress Notes (Signed)
Physical Therapy Session Note  Patient Details  Name: Glenn Maldonado MRN: [id number] Date of Birth: 1961/10/01  Today's Date: 07/14/2012 Time: 1400-1430 Time Calculation (min): 30 min  Short Term Goals: Week 3:     Skilled Therapeutic Interventions/Progress Updates:    This session focused on gait with L PFRW, safety with transfers and WC mobility.  Trey Paula, from [facility] delivered his left hand splint and the patient was instructed to wear it at night.  Trey Paula also observed and assisted with gait with left PFRW to assess the need for L AFO. Inconculsive he does have an extensor moment and an inversion moment at the foot when he fatigues, but his tone in the left leg supports him with gait.  F/u with Trey Paula next week if we think he will end up needing an AFO.  Pt had NO reports of dizziness this treatment session.    Therapy Documentation Precautions:  Precautions Precautions: Fall Precaution Comments: Left side hemiparesis Restrictions Weight Bearing Restrictions: No  Pain: Pain Assessment Pain Assessment: No/denies pain Pain Score: 0-No pain Mobility: Transfers Sit to Stand: 3: Mod assist Sit to Stand Details: Tactile cues for initiation;Tactile cues for sequencing;Verbal cues for precautions/safety;Manual facilitation for weight shifting Stand to Sit: 3: Mod assist;To bed;To elevated surface Stand to Sit Details (indicate cue type and reason): Verbal cues for sequencing;Visual cues/gestures for precautions/safety;Verbal cues for technique Stand Pivot Transfers: 3: Mod assist;From elevated surface Stand Pivot Transfer Details: Verbal cues for technique;Verbal cues for precautions/safety;Tactile cues for weight shifting Locomotion : Ambulation Ambulation: Yes Ambulation/Gait Assistance: 1: +2 Total assist Ambulation Distance (Feet): 25 Feet Assistive device: Left platform walker Ambulation/Gait Assistance Details: Tactile cues for weight shifting;Tactile cues for posture;Verbal cues  for sequencing;Verbal cues for technique;Verbal cues for precautions/safety;Manual facilitation for weight shifting;Manual facilitation for placement Ambulation/Gait Assistance Details: manual assist needed to help control his  Wheelchair Mobility Wheelchair Mobility: Yes Wheelchair Assistance: 4: Administrator, sports Details: Verbal cues for precautions/safety;Verbal cues for technique;Verbal cues for sequencing Wheelchair Propulsion: Right upper extremity;Right lower extremity Wheelchair Parts Management: Needs assistance Distance: 150, 150   See FIM for current functional status  Therapy/Group: Individual Therapy  Lurena Joiner B. Eliyanah Elgersma, PT, DPT [phone]   07/14/2012, 4:16 PM

## 2012-07-14 NOTE — Progress Notes (Signed)
Occupational Therapy Session Note  Patient Details  Name: Avyukth Leyton MRN: [id number] Date of Birth: 18-Jul-1961  Today's Date: 07/14/2012 Time: 1130-1145 Time Calculation (min): 15 min   Skilled Therapeutic Interventions/Progress Updates: Patient seen for [employer] today.  Patient has been issued a rocker knife, and has shown effective use of knife, yet today had finger foods, and it was not necessary.  Patient able to effectively self-manage swallowing strategies, and will no longer need to participate in this group.  patient aware, and pleased with his progress.     Therapy Documentation Precautions:  Precautions Precautions: Fall Precaution Comments: Left side hemiparesis Restrictions Weight Bearing Restrictions: No   Pain:  No report of pain    See FIM for current functional status  Therapy/Group: Group Therapy  [name] 07/14/2012, 12:15 PM

## 2012-07-14 NOTE — Progress Notes (Signed)
Speech Language Pathology Daily Session Note  Patient Details  Name: Glenn Maldonado MRN: [id number] Date of Birth: Jan 10, 1961  Today's Date: 07/14/2012 Time: 0950-1050 Time Calculation (min): 60 min  Short Term Goals: Week 2: SLP Short Term Goal 1 (Week 2): Patient will consume Dys.2 textures and honey-thick liquids with supervision level verbal cues to utilize compensatory strategies which prevent overt s/s of aspiraiton  SLP Short Term Goal 2 (Week 2): Patient will perform pharyngeal strengthening and diaphragmatic breahting exercises with min assist verbal and visual cues. SLP Short Term Goal 3 (Week 2): Patient will monitor and self correct speech intelligibility during conversation with modified independence.  SLP Short Term Goal 4 (Week 2): Patient will solve moderately complex problems with supervision level verbal cues.  Skilled Therapeutic Interventions: SLP administered the ALFA (Assessment of Language-Related Functional Activities), a standardized test that assesses cognitive-linguistic function during ADLs, during today's session.  Its subtests address ADLs such as telling time, counting money, addressing an envelope, solving daily math problems, writing a check and balancing a checkbook, understanding medicine labels, using a calendar, reading instructions, using the telephone, and writing a phone message.  The patient exhibited mild deficits in writing a check and balancing a checkbook that could be attributed to/exacerbated by the potential of it being an unfamiliar task.   Other areas assessed indicated a high probability of independent functioning or the need for some level of modification for independent functioning.     FIM:  Comprehension Comprehension Mode: Auditory Comprehension: 5-Follows basic conversation/direction: With extra time/assistive device Expression Expression Mode: Verbal Expression: 5-Expresses basic 90% of the time/requires cueing < 10% of the  time. Social Interaction Social Interaction: 6-Interacts appropriately with others with medication or extra time (anti-anxiety, antidepressant). Problem Solving Problem Solving: 5-Solves basic 90% of the time/requires cueing < 10% of the time Memory Memory: 5-Recognizes or recalls 90% of the time/requires cueing < 10% of the time   Therapy/Group: Individual Therapy  Jackalyn Lombard, Conrad [place]  Graduate Clinician Speech Language Pathology   Page, Joni Reining 07/14/2012, 12:14 PM  The above skilled treatment note has been reviewed and SLP is in agreement. Fae Pippin, M.A., CCC-SLP [phone]

## 2012-07-14 NOTE — Progress Notes (Signed)
Patient ID: Glenn Maldonado, male   DOB: [birth year], 51 y.o.   MRN: [id number]  Subjective/Complaints: Sleeping well.  Voiding well Review of Systems  Neurological: Positive for speech change and focal weakness. Negative for sensory change.  All other systems reviewed and are negative.    Objective: Vital Signs: Blood pressure 138/72, pulse 61, temperature 97.9 F (36.6 C), temperature source Oral, resp. rate 16, height 5\' 10"  (1.778 m), weight 79.1 kg (174 lb 6.1 oz), SpO2 98.00%. No results found. Results for orders placed during the hospital encounter of 07/01/12 (from the past 72 hour(s))  GLUCOSE, CAPILLARY     Status: Abnormal   Collection Time   07/11/12  7:40 AM      Component Value Range Comment   Glucose-Capillary 109 (*) 70 - 99 mg/dL    Comment 1 Documented in Chart      Comment 2 Notify RN     GLUCOSE, CAPILLARY     Status: Abnormal   Collection Time   07/11/12 11:35 AM      Component Value Range Comment   Glucose-Capillary 139 (*) 70 - 99 mg/dL    Comment 1 Notify RN     GLUCOSE, CAPILLARY     Status: Abnormal   Collection Time   07/11/12  4:47 PM      Component Value Range Comment   Glucose-Capillary 107 (*) 70 - 99 mg/dL    Comment 1 Notify RN     GLUCOSE, CAPILLARY     Status: Abnormal   Collection Time   07/11/12 10:12 PM      Component Value Range Comment   Glucose-Capillary 123 (*) 70 - 99 mg/dL    Comment 1 Notify RN     GLUCOSE, CAPILLARY     Status: Normal   Collection Time   07/12/12  7:32 AM      Component Value Range Comment   Glucose-Capillary 95  70 - 99 mg/dL    Comment 1 Notify RN     GLUCOSE, CAPILLARY     Status: Abnormal   Collection Time   07/12/12 11:28 AM      Component Value Range Comment   Glucose-Capillary 100 (*) 70 - 99 mg/dL    Comment 1 Notify RN     GLUCOSE, CAPILLARY     Status: Abnormal   Collection Time   07/12/12  4:41 PM      Component Value Range Comment   Glucose-Capillary 193 (*) 70 - 99 mg/dL    Comment 1 Notify RN       GLUCOSE, CAPILLARY     Status: Normal   Collection Time   07/12/12  9:40 PM      Component Value Range Comment   Glucose-Capillary 79  70 - 99 mg/dL    Comment 1 Documented in Chart      Comment 2 Notify RN     GLUCOSE, CAPILLARY     Status: Normal   Collection Time   07/13/12  7:17 AM      Component Value Range Comment   Glucose-Capillary 82  70 - 99 mg/dL    Comment 1 Notify RN     GLUCOSE, CAPILLARY     Status: Abnormal   Collection Time   07/13/12 11:26 AM      Component Value Range Comment   Glucose-Capillary 129 (*) 70 - 99 mg/dL    Comment 1 Notify RN     GLUCOSE, CAPILLARY     Status: Normal   Collection  Time   07/13/12  4:35 PM      Component Value Range Comment   Glucose-Capillary 79  70 - 99 mg/dL   GLUCOSE, CAPILLARY     Status: Abnormal   Collection Time   07/13/12 10:09 PM      Component Value Range Comment   Glucose-Capillary 102 (*) 70 - 99 mg/dL      HEENT: poor dentition Cardio: RRR Resp: CTA B/L after clears with cough otherwise upper airway sounds GI: BS positive Extremity:  Edema mild L dorsum hand Skin:   Intact Neuro: Lethargic, Cranial Nerve Abnormalities R central 7, Abnormal Motor 1/5 L FF,2/5 L deltoid, biceps and triceps,LE 2-/5 hip knee extensor synergy, trace toes Abnormal FMC Ataxic/ dec FMC and Tone  increased extensor tone in LUE, Tone:  Hypertonia and Dysarthric Musc/Skel:  Normal   Assessment/Plan: 1. 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 Physiatrist is providing close team supervision and 24 hour management of active medical problems listed below. Physiatrist and rehab team continue to assess barriers to discharge/monitor patient progress toward functional and medical goals. FIM: FIM - Bathing Bathing Steps Patient Completed: Chest;Right Arm;Left Arm;Abdomen;Front perineal area;Buttocks;Right upper leg;Left upper  leg;Right lower leg (including foot);Left lower leg (including foot) Bathing: 4: Min-Patient completes 8-9 57f 10 parts or 75+ percent  FIM - Upper Body Dressing/Undressing Upper body dressing/undressing steps patient completed: Thread/unthread right sleeve of pullover shirt/dresss;Thread/unthread left sleeve of pullover shirt/dress;Put head through opening of pull over shirt/dress;Pull shirt over trunk Upper body dressing/undressing: 4: Min-Patient completed 75 plus % of tasks FIM - Lower Body Dressing/Undressing Lower body dressing/undressing steps patient completed: Thread/unthread right underwear leg;Thread/unthread left underwear leg;Pull underwear up/down;Thread/unthread right pants leg;Thread/unthread left pants leg;Pull pants up/down;Don/Doff right sock;Don/Doff left sock;Don/Doff right shoe;Don/Doff left shoe Lower body dressing/undressing: 4: Min-Patient completed 75 plus % of tasks  FIM - Toileting Toileting steps completed by patient: Performs perineal hygiene Toileting Assistive Devices: Grab bar or rail for support Toileting: 2: Max-Patient completed 1 of 3 steps  FIM - Archivist Transfers: 3-From toilet/BSC: Mod A (lift or lower assist)  FIM - Banker Devices: Bed rails Bed/Chair Transfer: 3: Chair or W/C > Bed: Mod A (lift or lower assist);3: Bed > Chair or W/C: Mod A (lift or lower assist)  FIM - Locomotion: Wheelchair Distance: 100 Locomotion: Wheelchair: 0: Activity did not occur FIM - Locomotion: Ambulation Locomotion: Ambulation Assistive Devices: TEFL teacher;Walker - Rolling Ambulation/Gait Assistance: 1: +2 Total assist Locomotion: Ambulation: 1: Travels less than 50 ft with moderate assistance (Pt: 50 - 74%)  Comprehension Comprehension Mode: Auditory Comprehension: 5-Follows basic conversation/direction: With extra time/assistive device  Expression Expression Mode: Verbal Expression: 5-Expresses  complex 90% of the time/cues < 10% of the time  Social Interaction Social Interaction: 6-Interacts appropriately with others with medication or extra time (anti-anxiety, antidepressant).  Problem Solving Problem Solving: 5-Solves basic 90% of the time/requires cueing < 10% of the time  Memory Memory: 5-Requires cues to use assistive device  Medical Problem List and Plan:  1. DVT Prophylaxis/Anticoagulation: Pharmaceutical: Lovenox  2. Pain Management: Prn ultram for headaches.  3. Mood: will need a lot of ego support as high levels of frustration noted. Xanax prn neuropsych assessment and recs would be helpful as well.  4. Neuropsych: This patient is capable of making decisions on his/her own behalf.  5. STEMI/Non-obstructiveCAD: Treated medically. Ace initiated  for BP control.  6. Non-ischemic CM: HTN v/s CVA related.  7. Borderline DM :Hgb A1C at 6.8. Continue CBG checks AC/HS with SSI. May need oral agent.  8. HTN: Will monitor with bid checks. Cozaar not covered by $4 plan will trial Lisinopril may also need HCTZ  9. Dyslipidemia: continue lipitor. But will need pravachol at home due to $4 plan 10. Leucocytosis:  UA/[facility] -, afeb monitor   LOS (Days) 13 A FACE TO FACE EVALUATION WAS PERFORMED  Eligio Angert E 07/14/2012, 7:10 AM

## 2012-07-14 NOTE — Progress Notes (Signed)
Orthopedic Tech Progress Note Patient Details:  Glenn Maldonado 1961/05/10 [id number]  Patient ID: Glenn Maldonado, male   DOB: [birth year], 51 y.o.   MRN: [id number] Called advanced @1315   Nikki Dom 07/14/2012, 3:05 PM

## 2012-07-15 ENCOUNTER — Inpatient Hospital Stay (HOSPITAL_COMMUNITY): Payer: Medicaid - Out of State | Admitting: Speech Pathology

## 2012-07-15 ENCOUNTER — Inpatient Hospital Stay (HOSPITAL_COMMUNITY): Payer: Medicaid - Out of State | Admitting: *Deleted

## 2012-07-15 ENCOUNTER — Inpatient Hospital Stay (HOSPITAL_COMMUNITY): Payer: Medicaid - Out of State

## 2012-07-15 ENCOUNTER — Inpatient Hospital Stay (HOSPITAL_COMMUNITY): Payer: Medicaid - Out of State | Admitting: Occupational Therapy

## 2012-07-15 LAB — GLUCOSE, CAPILLARY
Glucose-Capillary: 127 mg/dL — ABNORMAL HIGH (ref 70–99)
Glucose-Capillary: 105 mg/dL — ABNORMAL HIGH (ref 70–99)

## 2012-07-15 NOTE — Progress Notes (Signed)
Orthopedic Tech Progress Note Patient Details:  [name] 05/19/1961 [id number]  Patient ID: Glenn Maldonado, male   DOB: 1961-07-08, 51 y.o.   MRN: [id number]   [name] 07/15/2012, 9:09 AM LEFT RESTING HAND SPLINT COMPLETED BY JEFF SMITH FROM [facility]

## 2012-07-15 NOTE — Progress Notes (Signed)
Occupational Therapy Session Note  Patient Details  Name: Glenn Maldonado MRN: [id number] Date of Birth: 11/10/1960  Today's Date: 07/15/2012 Time: 0730-0830 Time Calculation (min): 60 min   Short Term Goals: Week 2:  OT Short Term Goal 1 (Week 2): LTG= STG  Skilled Therapeutic Interventions/Progress Updates:  Patient's breakfast tray arrived prior to therapy and patient asked if he could eat first. Patient ate 50% of breakfast only requiring set-up assistance. ADL re-training session performed at sink this AM . ADL re-training session with focus on standing balance, hemi-dressing techniques. vc's for proper foot placement before standing. During static standing when patient was focused he only required Min Assist for balance. Once patient began to move and pull his pants up he leaned heavily towards the left side.    Therapy Documentation Precautions:  Precautions Precautions: Fall Precaution Comments: Left side hemiparesis Restrictions Weight Bearing Restrictions: No Pain: Pain Assessment Pain Assessment: No/denies pain   See FIM for current functional status  Therapy/Group: Individual Therapy  Limmie Patricia, OTR/L 07/15/2012, 8:29 AM

## 2012-07-15 NOTE — Progress Notes (Signed)
Speech Language Pathology Daily Session Note  Patient Details  Name: Glenn Maldonado MRN: [id number] Date of Birth: [birth year]  Today's Date: 07/15/2012 Time: 0305-0350 Time Calculation (min): 45 min  Short Term Goals: Week 2: SLP Short Term Goal 1 (Week 2): Patient will consume Dys.2 textures and honey-thick liquids with supervision level verbal cues to utilize compensatory strategies which prevent overt s/s of aspiraiton  SLP Short Term Goal 2 (Week 2): Patient will perform pharyngeal strengthening and diaphragmatic breahting exercises with min assist verbal and visual cues. SLP Short Term Goal 3 (Week 2): Patient will monitor and self correct speech intelligibility during conversation with modified independence.  SLP Short Term Goal 4 (Week 2): Patient will solve moderately complex problems with supervision level verbal cues.  Skilled Therapeutic Interventions: Session today focused on addressing cognitive goals.  SLP facilitated the session with supervision cues during a structured task that required the patient to manage medications.  SLP also educated patient and wife about what each medication was for, how to request easy to open medication bottles from the pharmacy to compensate for the patient's decreased use of his left hand, and the different types of pill organizers.     FIM:  Comprehension Comprehension Mode: Auditory Comprehension: 5-Understands complex 90% of the time/Cues < 10% of the time Expression Expression Mode: Verbal Expression: 5-Expresses basic needs/ideas: With no assist Social Interaction Social Interaction: 6-Interacts appropriately with others with medication or extra time (anti-anxiety, antidepressant). Problem Solving Problem Solving: 5-Solves complex 90% of the time/cues < 10% of the time Memory Memory: 5-Recognizes or recalls 90% of the time/requires cueing < 10% of the time   Therapy/Group: Individual Therapy  Jackalyn Lombard, Conrad [place]  Graduate  Clinician Speech Language Pathology  Page, Joni Reining 07/15/2012, 4:17 PM  The above skilled treatment note has been reviewed and SLP is in agreement. Fae Pippin, M.A., CCC-SLP [phone]

## 2012-07-15 NOTE — Progress Notes (Signed)
Occupational Therapy Session Note  Patient Details  Name: Glenn Maldonado MRN: [id number] Date of Birth: 1961/06/28  Today's Date: 07/15/2012 Time: 1130-1200 Time Calculation (min): 30 min   Skilled Therapeutic Interventions/Progress Updates:    Worked on self feeding in group format.  Focus on selective attention, LUE use for eating 5% of meal.  Also scanning to the left of midline for locating food items and utensils without cueing.  Therapy Documentation Precautions:  Precautions Precautions: Fall Precaution Comments: Left side hemiparesis Restrictions Weight Bearing Restrictions: No  Pain: Pain Assessment Pain Assessment: No/denies pain ADL:  See FIM for current functional status  Therapy/Group: Group Therapy  Sumaiyah Markert OTR/L 07/15/2012, 12:57 PM

## 2012-07-15 NOTE — Progress Notes (Signed)
Patient ID: Glenn Maldonado, male   DOB: 1961-03-30, 51 y.o.   MRN: [id number]  Subjective/Complaints: Feels ok, some shoulder pain Review of Systems  Neurological: Positive for speech change and focal weakness. Negative for sensory change.  All other systems reviewed and are negative.    Objective: Vital Signs: Blood pressure 159/84, pulse 87, temperature 98.1 F (36.7 C), temperature source Oral, resp. rate 19, height 5\' 10"  (1.778 m), weight 79.1 kg (174 lb 6.1 oz), SpO2 97.00%. No results found. Results for orders placed during the hospital encounter of 07/01/12 (from the past 72 hour(s))  GLUCOSE, CAPILLARY     Status: Abnormal   Collection Time   07/12/12 11:28 AM      Component Value Range Comment   Glucose-Capillary 100 (*) 70 - 99 mg/dL    Comment 1 Notify RN     GLUCOSE, CAPILLARY     Status: Abnormal   Collection Time   07/12/12  4:41 PM      Component Value Range Comment   Glucose-Capillary 193 (*) 70 - 99 mg/dL    Comment 1 Notify RN     GLUCOSE, CAPILLARY     Status: Normal   Collection Time   07/12/12  9:40 PM      Component Value Range Comment   Glucose-Capillary 79  70 - 99 mg/dL    Comment 1 Documented in Chart      Comment 2 Notify RN     GLUCOSE, CAPILLARY     Status: Normal   Collection Time   07/13/12  7:17 AM      Component Value Range Comment   Glucose-Capillary 82  70 - 99 mg/dL    Comment 1 Notify RN     GLUCOSE, CAPILLARY     Status: Abnormal   Collection Time   07/13/12 11:26 AM      Component Value Range Comment   Glucose-Capillary 129 (*) 70 - 99 mg/dL    Comment 1 Notify RN     GLUCOSE, CAPILLARY     Status: Normal   Collection Time   07/13/12  4:35 PM      Component Value Range Comment   Glucose-Capillary 79  70 - 99 mg/dL   GLUCOSE, CAPILLARY     Status: Abnormal   Collection Time   07/13/12 10:09 PM      Component Value Range Comment   Glucose-Capillary 102 (*) 70 - 99 mg/dL   GLUCOSE, CAPILLARY     Status: Normal   Collection Time   07/14/12   7:26 AM      Component Value Range Comment   Glucose-Capillary 94  70 - 99 mg/dL    Comment 1 Notify RN     GLUCOSE, CAPILLARY     Status: Abnormal   Collection Time   07/14/12 11:20 AM      Component Value Range Comment   Glucose-Capillary 109 (*) 70 - 99 mg/dL    Comment 1 Notify RN     GLUCOSE, CAPILLARY     Status: Abnormal   Collection Time   07/14/12  4:27 PM      Component Value Range Comment   Glucose-Capillary 109 (*) 70 - 99 mg/dL    Comment 1 Notify RN     GLUCOSE, CAPILLARY     Status: Abnormal   Collection Time   07/14/12  9:02 PM      Component Value Range Comment   Glucose-Capillary 133 (*) 70 - 99 mg/dL    Comment  1 Notify RN        HEENT: poor dentition Cardio: RRR Resp: CTA B/L after clears with cough otherwise upper airway sounds GI: BS positive Extremity:  Edema mild L dorsum hand Skin:   Intact Neuro: Lethargic, Cranial Nerve Abnormalities R central 7, Abnormal Motor 1/5 L FF,2/5 L deltoid, biceps and triceps,LE 2-/5 hip knee extensor synergy, trace toes Abnormal FMC Ataxic/ dec FMC and Tone  increased extensor tone in LUE, Tone:  Hypertonia and Dysarthric Musc/Skel:  Normal   Assessment/Plan: 1. 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 Physiatrist is providing close team supervision and 24 hour management of active medical problems listed below. Physiatrist and rehab team continue to assess barriers to discharge/monitor patient progress toward functional and medical goals. FIM: FIM - Bathing Bathing Steps Patient Completed: Chest;Right Arm;Left Arm;Abdomen;Front perineal area;Buttocks;Right upper leg;Left upper leg;Right lower leg (including foot);Left lower leg (including foot) Bathing: 4: Min-Patient completes 8-9 13f 10 parts or 75+ percent  FIM - Upper Body Dressing/Undressing Upper body dressing/undressing steps patient  completed: Thread/unthread right sleeve of pullover shirt/dresss;Thread/unthread left sleeve of pullover shirt/dress;Put head through opening of pull over shirt/dress;Pull shirt over trunk Upper body dressing/undressing: 5: Set-up assist to: Obtain clothing/put away FIM - Lower Body Dressing/Undressing Lower body dressing/undressing steps patient completed: Thread/unthread right underwear leg;Thread/unthread left underwear leg;Pull underwear up/down;Thread/unthread right pants leg;Thread/unthread left pants leg;Pull pants up/down;Don/Doff right sock;Don/Doff left sock;Don/Doff right shoe;Don/Doff left shoe Lower body dressing/undressing: 4: Steadying Assist  FIM - Toileting Toileting steps completed by patient: Performs perineal hygiene Toileting Assistive Devices: Grab bar or rail for support Toileting: 2: Max-Patient completed 1 of 3 steps  FIM - Archivist Transfers: 3-From toilet/BSC: Mod A (lift or lower assist)  FIM - Banker Devices: Bed rails Bed/Chair Transfer: 6: Supine > Sit: No assist;3: Bed > Chair or W/C: Mod A (lift or lower assist)  FIM - Locomotion: Wheelchair Distance: 150, 150 Locomotion: Wheelchair: 0: Activity did not occur FIM - Locomotion: Ambulation Locomotion: Ambulation Assistive Devices: TEFL teacher;Walker - Rolling Ambulation/Gait Assistance: 1: +2 Total assist Locomotion: Ambulation: 1: Travels less than 50 ft with moderate assistance (Pt: 50 - 74%)  Comprehension Comprehension Mode: Auditory Comprehension: 5-Follows basic conversation/direction: With extra time/assistive device  Expression Expression Mode: Verbal Expression: 5-Expresses basic 90% of the time/requires cueing < 10% of the time.  Social Interaction Social Interaction: 6-Interacts appropriately with others with medication or extra time (anti-anxiety, antidepressant).  Problem Solving Problem Solving: 5-Solves basic 90% of the  time/requires cueing < 10% of the time  Memory Memory: 5-Recognizes or recalls 90% of the time/requires cueing < 10% of the time  Medical Problem List and Plan:  1. DVT Prophylaxis/Anticoagulation: Pharmaceutical: Lovenox  2. Pain Management: Prn ultram for headaches.  3. Mood: will need a lot of ego support as high levels of frustration noted. Xanax prn neuropsych assessment and recs would be helpful as well.  4. Neuropsych: This patient is capable of making decisions on his/her own behalf.  5. STEMI/Non-obstructiveCAD: Treated medically. Ace initiated for BP control.  6. Non-ischemic CM: HTN v/s CVA related.  7. Borderline DM :Hgb A1C at 6.8. Continue CBG checks AC/HS with SSI. May need oral agent.  8. HTN: Will monitor with bid checks. Cozaar not covered by $4 plan will trial Lisinopril may also need HCTZ  9. Dyslipidemia: continue lipitor. But will need pravachol at  home due to $4 plan 10. Leucocytosis:  UA/[facility] -, afeb monitor   LOS (Days) 14 A FACE TO FACE EVALUATION WAS PERFORMED  Dashiel Bergquist E 07/15/2012, 7:42 AM

## 2012-07-15 NOTE — Patient Care Conference (Signed)
Inpatient RehabilitationTeam Conference Note Date: 07/15/2012   Time: 10:45 AM    Patient Name: Glenn Maldonado      Medical Record Number: [id number]  Date of Birth: Jun 20, 1961 Sex: Male         Room/Bed: 4031/4031-01 Payor Info: Payor: MEDICAID PENDING  Plan: MEDICAID PENDING  Product Type: *No Product type*     Admitting Diagnosis: RT CVA  Admit Date/Time:  07/01/2012  4:03 PM Admission Comments: No comment available   Primary Diagnosis:  CVA (cerebral infarction) Principal Problem: CVA (cerebral infarction)  Patient Active Problem List   Diagnosis Date Noted  . CVA (cerebral infarction) 06/29/2012  . 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 HTN (hypertension), uncontrolled 06/28/2012  . Leg weakness, bilateral 06/28/2012  . Tobacco abuse 06/28/2012  . Weight loss, non-intentional 06/28/2012  . OSA (obstructive sleep apnea), history of, but with recent weight loss less symptoms 06/28/2012  . NICM, EF 35% at cardiac cath, 55% by echo 06/28/2012    Expected Discharge Date: Expected Discharge Date: 07/22/12  Team Members Present: Physician: Dr. Claudette Laws Social Worker Present: Dossie Der, LCSW Nurse Present: Other (comment) Milly Jakob Coem-RN) PT Present: Edman Circle, PT OT Present: Leonette Monarch, OT SLP Present: Fae Pippin, SLP Other (Discipline and Name): Charolette Child Coordinator     Current Status/Progress Goal Weekly Team Focus  Medical   Left hemiparesis improving, left neglect improving, swallowing improving  Monitor on new diet  Maintain adequate fluid and caloric intake   Bowel/Bladder             Swallow/Nutrition/ Hydration   regular textures and thin liquids  least restrictive p.o.  facilitating independence of strategies    ADL's   Patient is currently Min Assist for grooming/bathing/dressing. Functional t/f: Mod A squat pivot.  Min Assist overall  family education, ADL performance, functional  transfers, A/ROM Left UE, standing balance.   Mobility   mod assist for transfers due to safety and right lean, gait with two person assist one to help support trunk and one to help control knee, possible need for AFO-[facility] observed gait today when he delivered his left hand splint..  F/u with Trey Paula next week if we think AFO is needed for left foot and knee control.  Dizziness seems to be much better since vestibular eval and treatment.  No reports of dizziness during his session with me today.    min assist overall  safety awareness, gait, vestibular   Communication         mild dysarthria not being addressed at this time    Safety/Cognition/ Behavioral Observations  supervision  mod I   increase carryover, self monitoring and correcting   Pain             Skin                *See Interdisciplinary Assessment and Plan and progress notes for long and short-term goals  Barriers to Discharge: Will need 24 7 supervision but wife may need to return to work    Possible Resolutions to Barriers:  Explore other caregiver options, try to improve to modified independent status    Discharge Planning/Teaching Needs:  Home with wife providing assitance-applying for SSD and Medicaid.  Wife here daily and attends therapies with pt      Team Discussion:  Vestibular issues getting better with exercises.  Pain in shoulder-adjusting meds, not as impulsive.  D/C SPT  AFO coming  Revisions to Treatment Plan:  None   Continued Need for Acute Rehabilitation Level of Care: The patient requires daily medical management by a physician with specialized training in physical medicine and rehabilitation for the following conditions: Daily direction of a multidisciplinary physical rehabilitation program to ensure safe treatment while eliciting the highest outcome that is of practical value to the patient.: Yes Daily medical management of patient stability for increased activity during participation in  an intensive rehabilitation regime.: Yes Daily analysis of laboratory values and/or radiology reports with any subsequent need for medication adjustment of medical intervention for : Neurological problems  Mat Stuard, Lemar Livings 07/17/2012, 9:14 AM

## 2012-07-15 NOTE — Progress Notes (Signed)
Recreational Therapy Session Note  Patient Details  Name: Makarios Vliet MRN: [id number] Date of Birth: Jan 11, 1961 Today's Date: 07/15/2012 Time:  1600-1715 Pain: no c/o Skilled Therapeutic Interventions/Progress Updates: Pt participated in tabletop game of choice with supervision/set up assist.  Discussion with pt and pt's wife about leisure activities post d/c, community reintegration, Secondary school teacher and adaptive equipment for kitchen use.     Griff Badley 07/15/2012, 5:42 PM

## 2012-07-15 NOTE — Progress Notes (Signed)
Speech Language Pathology Daily Session Note  Patient Details  Name: Glenn Maldonado MRN: [id number] Date of Birth: 1961-02-09  Today's Date: 07/15/2012 Time: 1200-1220 Time Calculation (min): 20 min  Short Term Goals: Week 2: SLP Short Term Goal 1 (Week 2): Patient will consume Dys.2 textures and honey-thick liquids with supervision level verbal cues to utilize compensatory strategies which prevent overt s/s of aspiraiton  SLP Short Term Goal 2 (Week 2): Patient will perform pharyngeal strengthening and diaphragmatic breahting exercises with min assist verbal and visual cues. SLP Short Term Goal 3 (Week 2): Patient will monitor and self correct speech intelligibility during conversation with modified independence.  SLP Short Term Goal 4 (Week 2): Patient will solve moderately complex problems with supervision level verbal cues.  Skilled Therapeutic Interventions: Co-treatment session with OT; SLP facilitated session by educating paitent on safe swallow compensatory strategies and paitent informed SLP that he knew and utilizes a throat clear when he needs to.  Patient utilize strategies with increased wait time during session and encouraged other patient's to utilize their strategies as well.     FIM:  Comprehension Comprehension Mode: Auditory Comprehension: 5-Follows basic conversation/direction: With extra time/assistive device Expression Expression Mode: Verbal Expression: 5-Expresses basic 90% of the time/requires cueing < 10% of the time. Social Interaction Social Interaction: 6-Interacts appropriately with others with medication or extra time (anti-anxiety, antidepressant). Problem Solving Problem Solving: 5-Solves basic 90% of the time/requires cueing < 10% of the time Memory Memory: 5-Recognizes or recalls 90% of the time/requires cueing < 10% of the time FIM - Eating Eating Activity: 5: Supervision/cues  Pain Pain Assessment Pain Assessment: No/denies  pain  Therapy/Group: Group Therapy  Charlane Ferretti., CCC-SLP [phone]  Glenn Maldonado 07/15/2012, 1:19 PM

## 2012-07-15 NOTE — Progress Notes (Signed)
Physical Therapy Session Note  Patient Details  Name: Glenn Maldonado MRN: [id number] Date of Birth: 1961-05-08  Today's Date: 07/15/2012 Time: 4098-1191 Time Calculation (min): 50 min  Short Term Goals: Week 2:  PT Short Term Goal 1 (Week 2): Pt will perform all bed mobility with supervision, VCs. PT Short Term Goal 2 (Week 2): Pt will transfer L and R with min assist consistently. PT Short Term Goal 3 (Week 2): Pt will propel w/c x 50' in congested home environment with supervision. PT Short Term Goal 4 (Week 2): Pt will perform gait x 25' with assistance of 1 person. PT Short Term Goal 5 (Week 2): Pt will ascend and descend 1 step with mod assist.  Skilled Therapeutic Interventions/Progress Updates:  Patient propelled wheelchair on level tile surfaces 160 feet using right extremities. Patient transferred to mat with min assist. Patient ambulated with PFRW 60 feet with max assist to facilitate placement of left LE. Patient tends to adduct left LE during swing. Worked on facilitation and control of left LE supine at edge of mat - lifting and lowering left LE on and off mat. Also worked on left foot placement in standing - stepping forward and back onto targets. Patient ambulated 80 feet with max assist and better placement of left LE.   Therapy Documentation Precautions:  Precautions Precautions: Fall Precaution Comments: Left side hemiparesis Restrictions Weight Bearing Restrictions: No  Locomotion : Ambulation Ambulation/Gait Assistance: 2: Max Lawyer Distance: 160 feet   See FIM for current functional status  Therapy/Group: Individual Therapy  Alma Friendly 07/15/2012, 12:34 PM

## 2012-07-16 ENCOUNTER — Inpatient Hospital Stay (HOSPITAL_COMMUNITY): Payer: Medicaid - Out of State

## 2012-07-16 ENCOUNTER — Inpatient Hospital Stay (HOSPITAL_COMMUNITY): Payer: Medicaid - Out of State | Admitting: Occupational Therapy

## 2012-07-16 ENCOUNTER — Inpatient Hospital Stay (HOSPITAL_COMMUNITY): Payer: Medicaid - Out of State | Admitting: *Deleted

## 2012-07-16 ENCOUNTER — Inpatient Hospital Stay (HOSPITAL_COMMUNITY): Payer: Medicaid - Out of State | Admitting: Speech Pathology

## 2012-07-16 LAB — GLUCOSE, CAPILLARY
Glucose-Capillary: 91 mg/dL (ref 70–99)
Glucose-Capillary: 566 mg/dL (ref 70–99)

## 2012-07-16 MED ORDER — HYDROCHLOROTHIAZIDE 12.5 MG PO CAPS
12.5000 mg | ORAL_CAPSULE | Freq: Every day | ORAL | Status: DC
  Administered 2012-07-16 – 2012-07-22 (×7): 12.5 mg via ORAL
  Filled 2012-07-16 (×9): qty 1

## 2012-07-16 NOTE — Progress Notes (Signed)
Physical Therapy Session Note  Patient Details  Name: Glenn Maldonado MRN: [id number] Date of Birth: 10/30/1961  Today's Date: 07/16/2012 Time: 8657-8469 Time Calculation (min): 30 min  Short Term Goals: Week 2:  PT Short Term Goal 1 (Week 2): Pt will perform all bed mobility with supervision, VCs. PT Short Term Goal 2 (Week 2): Pt will transfer L and R with min assist consistently. PT Short Term Goal 3 (Week 2): Pt will propel w/c x 50' in congested home environment with supervision. PT Short Term Goal 4 (Week 2): Pt will perform gait x 25' with assistance of 1 person. PT Short Term Goal 5 (Week 2): Pt will ascend and descend 1 step with mod assist.  Skilled Therapeutic Interventions/Progress Updates:    pain- none  NMR- stepping up 6 " step with first R for forced use, then L then alternating with cues for L activation and assist to prevent knee locking in single leg stance, RUE on rail, LUE supported by PT; progressed to completing 5 steps (with encouragement d/t decreased confidence)  Step to pattern with R hand on rail instruction and facilitation to L knee min A    Therapy Documentation Precautions:  Precautions Precautions: Fall Precaution Comments: Left side hemiparesis Restrictions Weight Bearing Restrictions: No   Other Treatments: Treatments Neuromuscular Facilitation: Left;Lower Extremity;Forced use;Activity to increase motor control;Activity to increase sustained activation;Activity to increase grading;Activity to increase timing and sequencing;Limitations Neuromuscular Facilitation Limitations: knee wobble if blocked by PT from hyperextension   See FIM for current functional status  Therapy/Group: Individual Therapy  Michaelene Song 07/16/2012, 3:38 PM

## 2012-07-16 NOTE — Progress Notes (Addendum)
Physical Therapy Session Note  Patient Details  Name: Glenn Maldonado MRN: [id number] Date of Birth: May 16, 1961  Today's Date: 07/16/2012 Time: 1020-1105 Time Calculation (min): 45 min  Short Term Goals: Week 2:  PT Short Term Goal 1 (Week 2): Pt will perform all bed mobility with supervision, VCs. PT Short Term Goal 1 - Progress (Week 2): Met PT Short Term Goal 2 (Week 2): Pt will transfer L and R with min assist consistently. PT Short Term Goal 2 - Progress (Week 2): Progressing toward goal PT Short Term Goal 3 (Week 2): Pt will propel w/c x 50' in congested home environment with supervision. PT Short Term Goal 3 - Progress (Week 2): Progressing toward goal PT Short Term Goal 4 (Week 2): Pt will perform gait x 25' with assistance of 1 person. PT Short Term Goal 4 - Progress (Week 2): Met PT Short Term Goal 5 (Week 2): Pt will ascend and descend 1 step with mod assist. PT Short Term Goal 5 - Progress (Week 2): Met  Skilled Therapeutic Interventions/Progress Updates:           Therapy Documentation Precautions:  Precautions Precautions: Fall Precaution Comments: Left side hemiparesis Restrictions Weight Bearing Restrictions: No   Pain: Pain Assessment Pain Assessment: No/denies pain     Treatments Neuromuscular Facilitation: Left;Lower Extremity;Forced use;Activity to increase motor control;Activity to increase sustained activation;Activity to increase grading;Activity to increase timing and sequencing;Limitations Neuromuscular Facilitation Limitations: knee wobble if blocked by PT from hyperextension   See FIM for current functional status  Therapy/Group: Individual Therapy  [name] 07/16/2012, 4:01 PM

## 2012-07-16 NOTE — Progress Notes (Signed)
Occupational Therapy Session Note  Patient Details  Name: Glenn Maldonado MRN: [id number] Date of Birth: 1960/11/29  Today's Date: 07/16/2012 Time: 0730-0830 Time Calculation (min): 60 min  Short Term Goals: Week 2:  OT Short Term Goal 1 (Week 2): LTG= STG  Skilled Therapeutic Interventions/Progress Updates:    Patient seen for individual OT treatment session with focus on ADL re-training at shower level this AM .  Specific focus on transfers, standing balance, hemi-dressing techniques. Pt benefits from vc's for proper foot placement before standing. During static standing when patient was focused, he was Min Assist for balance, however he was mod A if he was talking. Patient is noted to lean heavily towards the left side especially while don/doff pants and attempting pull up underwear/pants.   Therapy Documentation Precautions:  Precautions Precautions: Fall Precaution Comments: Left side hemiparesis Restrictions Weight Bearing Restrictions: No   Vital Signs: Therapy Vitals Temp: 98 F (36.7 C) Temp src: Tympanic Pulse Rate: 74  Resp: 18  BP: 156/91 mmHg Patient Position, if appropriate: Sitting Oxygen Therapy SpO2: 99 % O2 Device: None (Room air) Pain: Pain Assessment Pain Assessment: 0-10 Pain Score:   Pt reports 0/10 pain during session; No c/o  ADL: ADL Eating: Not assessed Grooming: Maximal assistance Where Assessed-Grooming: Sitting at sink Upper Body Bathing: Minimal assistance Where Assessed-Upper Body Bathing: Sitting at sink Lower Body Bathing: Maximal assistance Where Assessed-Lower Body Bathing: Sitting at sink;Standing at sink Upper Body Dressing: Moderate assistance Where Assessed-Upper Body Dressing: Sitting at sink Lower Body Dressing: Dependent Where Assessed-Lower Body Dressing: Standing at sink;Sitting at sink Toileting: Not assessed ADL Comments: Patient needed assist with static sitting balance, vc's for technique, patient became frustrated  several times with performance during ADL.      See FIM for current functional status  Therapy/Group: Individual Therapy  Alm Bustard 07/16/2012, 10:39 AM

## 2012-07-16 NOTE — Progress Notes (Signed)
Patient ID: Glenn Maldonado, male   DOB: Nov 09, 1960, 51 y.o.   MRN: [id number]  Subjective/Complaints: Median 3 digits of both hands tingling even prior to CVA, occ neck pain.  Nocturnal pain Review of Systems  Neurological: Positive for speech change and focal weakness. Negative for sensory change.  All other systems reviewed and are negative.    Objective: Vital Signs: Blood pressure 145/85, pulse 65, temperature 97.7 F (36.5 C), temperature source Oral, resp. rate 19, height 5\' 10"  (1.778 m), weight 77.1 kg (169 lb 15.6 oz), SpO2 99.00%. No results found. Results for orders placed during the hospital encounter of 07/01/12 (from the past 72 hour(s))  GLUCOSE, CAPILLARY     Status: Abnormal   Collection Time   07/13/12 11:26 AM      Component Value Range Comment   Glucose-Capillary 129 (*) 70 - 99 mg/dL    Comment 1 Notify RN     GLUCOSE, CAPILLARY     Status: Normal   Collection Time   07/13/12  4:35 PM      Component Value Range Comment   Glucose-Capillary 79  70 - 99 mg/dL   GLUCOSE, CAPILLARY     Status: Abnormal   Collection Time   07/13/12 10:09 PM      Component Value Range Comment   Glucose-Capillary 102 (*) 70 - 99 mg/dL   GLUCOSE, CAPILLARY     Status: Normal   Collection Time   07/14/12  7:26 AM      Component Value Range Comment   Glucose-Capillary 94  70 - 99 mg/dL    Comment 1 Notify RN     GLUCOSE, CAPILLARY     Status: Abnormal   Collection Time   07/14/12 11:20 AM      Component Value Range Comment   Glucose-Capillary 109 (*) 70 - 99 mg/dL    Comment 1 Notify RN     GLUCOSE, CAPILLARY     Status: Abnormal   Collection Time   07/14/12  4:27 PM      Component Value Range Comment   Glucose-Capillary 109 (*) 70 - 99 mg/dL    Comment 1 Notify RN     GLUCOSE, CAPILLARY     Status: Abnormal   Collection Time   07/14/12  9:02 PM      Component Value Range Comment   Glucose-Capillary 133 (*) 70 - 99 mg/dL    Comment 1 Notify RN     GLUCOSE, CAPILLARY     Status:  Abnormal   Collection Time   07/15/12  7:41 AM      Component Value Range Comment   Glucose-Capillary 104 (*) 70 - 99 mg/dL    Comment 1 Notify RN     GLUCOSE, CAPILLARY     Status: Abnormal   Collection Time   07/15/12 11:24 AM      Component Value Range Comment   Glucose-Capillary 127 (*) 70 - 99 mg/dL    Comment 1 Notify RN     GLUCOSE, CAPILLARY     Status: Abnormal   Collection Time   07/15/12  5:03 PM      Component Value Range Comment   Glucose-Capillary 100 (*) 70 - 99 mg/dL    Comment 1 Notify RN     GLUCOSE, CAPILLARY     Status: Abnormal   Collection Time   07/15/12  9:00 PM      Component Value Range Comment   Glucose-Capillary 105 (*) 70 - 99 mg/dL  Comment 1 Notify RN        HEENT: poor dentition Cardio: RRR Resp: CTA B/L after clears with cough otherwise upper airway sounds GI: BS positive Extremity:  Edema mild L dorsum hand Skin:   Intact Neuro: Lethargic, Cranial Nerve Abnormalities R central 7, Abnormal Motor 1/5 L FF,2/5 L deltoid, biceps and triceps,LE 2-/5 hip knee extensor synergy, trace toes Abnormal FMC Ataxic/ dec FMC and Tone  increased extensor tone in LUE, Tone:  Hypertonia and Dysarthric Musc/Skel:  Normal   Assessment/Plan: 1. 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 Physiatrist is providing close team supervision and 24 hour management of active medical problems listed below. Physiatrist and rehab team continue to assess barriers to discharge/monitor patient progress toward functional and medical goals. FIM: FIM - Bathing Bathing Steps Patient Completed: Chest;Right Arm;Left Arm;Abdomen;Front perineal area;Buttocks;Right upper leg;Left upper leg;Right lower leg (including foot);Left lower leg (including foot) Bathing: 4: Min-Patient completes 8-9 59f 10 parts or 75+ percent  FIM - Upper Body Dressing/Undressing Upper  body dressing/undressing steps patient completed: Thread/unthread right sleeve of pullover shirt/dresss;Thread/unthread left sleeve of pullover shirt/dress;Put head through opening of pull over shirt/dress;Pull shirt over trunk Upper body dressing/undressing: 5: Set-up assist to: Obtain clothing/put away FIM - Lower Body Dressing/Undressing Lower body dressing/undressing steps patient completed: Thread/unthread right underwear leg;Thread/unthread left underwear leg;Pull underwear up/down;Thread/unthread right pants leg;Thread/unthread left pants leg;Pull pants up/down;Don/Doff right sock;Don/Doff left sock;Don/Doff right shoe;Don/Doff left shoe Lower body dressing/undressing: 4: Steadying Assist  FIM - Toileting Toileting steps completed by patient: Performs perineal hygiene Toileting Assistive Devices: Grab bar or rail for support Toileting: 2: Max-Patient completed 1 of 3 steps  FIM - Archivist Transfers: 3-From toilet/BSC: Mod A (lift or lower assist)  FIM - Banker Devices: Bed rails Bed/Chair Transfer: 5: Supine > Sit: Supervision (verbal cues/safety issues);6: Sit > Supine: No assist;4: Bed > Chair or W/C: Min A (steadying Pt. > 75%);4: Chair or W/C > Bed: Min A (steadying Pt. > 75%)  FIM - Locomotion: Wheelchair Distance: 160 feet Locomotion: Wheelchair: 4: Travels 150 ft or more: maneuvers on rugs and over door sillls with minimal assistance (Pt.>75%) FIM - Locomotion: Ambulation Locomotion: Ambulation Assistive Devices: TEFL teacher;Walker - Rolling Ambulation/Gait Assistance: 2: Max assist Locomotion: Ambulation: 2: Travels 50 - 149 ft with maximal assistance (Pt: 25 - 49%) (80 feet)  Comprehension Comprehension Mode: Auditory Comprehension: 5-Understands complex 90% of the time/Cues < 10% of the time  Expression Expression Mode: Verbal Expression: 5-Expresses basic needs/ideas: With no assist  Social  Interaction Social Interaction: 6-Interacts appropriately with others with medication or extra time (anti-anxiety, antidepressant).  Problem Solving Problem Solving: 5-Solves complex 90% of the time/cues < 10% of the time  Memory Memory: 5-Recognizes or recalls 90% of the time/requires cueing < 10% of the time  Medical Problem List and Plan:  1. DVT Prophylaxis/Anticoagulation: Pharmaceutical: Lovenox  2. Pain Management: Prn ultram for headaches.  3. Mood: will need a lot of ego support as high levels of frustration noted. Xanax prn 4. Neuropsych: This patient is capable of making decisions on his/her own behalf.  5. STEMI/Non-obstructiveCAD: Treated medically. Ace initiated for BP control.  6. Non-ischemic CM: HTN v/s CVA related.  7. Borderline DM :Hgb A1C at 6.8. Continue CBG checks AC/HS with SSI. May need oral agent.  8. HTN: Will monitor with bid checks. Cozaar not covered by $  4 plan will trial Lisinopril  also need HCTZ  9. Dyslipidemia: continue lipitor. But will need pravachol at home due to $4 plan 10. Leucocytosis:  UA/UCS -, afeb monitor 11. CTS mild conservative care  LOS (Days) 15 A FACE TO FACE EVALUATION WAS PERFORMED  KIRSTEINS,ANDREW E 07/16/2012, 7:35 AM

## 2012-07-16 NOTE — Progress Notes (Signed)
Occupational Therapy Session Note  Patient Details  Name: Glenn Maldonado MRN: [id number] Date of Birth: [birth year]  Today's Date: 07/16/2012 Time: 1130-1200 Time Calculation (min): 30 min  Short Term Goals: Week 2:  OT Short Term Goal 1 (Week 2): LTG= STG  Skilled Therapeutic Interventions/Progress Updates:    Pt seen in group setting for self feeding w/ focus on selective attention, LUE use as gross assist and neuromuscular re-education w/ LUE to eat approximately 10% of meal w/ hand over hand assist to pick up french fries & hold hamburger & bring to mouth using bilateral UE's. VC's for scanning to the left of midline & locate to food itemswithout cues.  Therapy Documentation Precautions:  Precautions Precautions: Fall Precaution Comments: Left side hemiparesis Restrictions Weight Bearing Restrictions: No   Vital Signs: Therapy Vitals Temp: 98 F (36.7 C) Temp src: Tympanic Pulse Rate: 74  Resp: 18  BP: 156/91 mmHg Patient Position, if appropriate: Sitting Oxygen Therapy SpO2: 99 % O2 Device: None (Room air) Pain: Pain Assessment Pain Assessment: 0-10 Pain Score:   No c/o     See FIM for current functional status  Therapy/Group: Group Therapy  Charletta Cousin, Amy Beth Dixon 07/16/2012, 12:26 PM

## 2012-07-16 NOTE — Progress Notes (Signed)
07/15/2012 RN called to room at 1330, patient had an assisted fall with Misty Stanley, NT while weighing patient on the scale, patient reported his left leg were too weak and it gave up on him.  MD notified, vital signs within normal, no bruising or skin breakdown noted, patient denies pain.  Debriefed with patient, family, and staffs.  Plan to weigh patient in bed instead of scale, bed alarm when in bed, quick release belt in chair, call bell within reach.  Will continue with plan of care.

## 2012-07-16 NOTE — Progress Notes (Signed)
Speech Language Pathology Daily Session Note  Patient Details  Name: Glenn Maldonado MRN: [id number] Date of Birth: January 09, 1961  Today's Date: 07/16/2012 Time: 1200-1210 Time Calculation (min): 10 min  Short Term Goals: Week 2: SLP Short Term Goal 1 (Week 2): Patient will consume Dys.2 textures and honey-thick liquids with supervision level verbal cues to utilize compensatory strategies which prevent overt s/s of aspiraiton  SLP Short Term Goal 2 (Week 2): Patient will perform pharyngeal strengthening and diaphragmatic breahting exercises with min assist verbal and visual cues. SLP Short Term Goal 3 (Week 2): Patient will monitor and self correct speech intelligibility during conversation with modified independence.  SLP Short Term Goal 4 (Week 2): Patient will solve moderately complex problems with supervision level verbal cues.  Skilled Therapeutic Interventions: Group treatment session focused on addressing dysphagia goals.  SLP facilitated session by educating paitent on focus of dysphagia goals and management of mixed consistencies.  Patient explained that he had difficulty managing chips.  SLP will address in next session.    FIM:  Comprehension Comprehension: 5-Understands complex 90% of the time/Cues < 10% of the time Expression Expression: 5-Expresses basic needs/ideas: With no assist Social Interaction Social Interaction: 6-Interacts appropriately with others with medication or extra time (anti-anxiety, antidepressant). Problem Solving Problem Solving: 5-Solves basic problems: With no assist Memory Memory: 5-Recognizes or recalls 90% of the time/requires cueing < 10% of the time FIM - Eating Eating Activity: 5: Supervision/cues  Pain Pain Assessment Pain Assessment: No/denies pain  Therapy/Group: Group Therapy  Charlane Ferretti., CCC-SLP [phone]  Marshia Tropea 07/16/2012, 1:33 PM

## 2012-07-16 NOTE — Progress Notes (Signed)
Occupational Therapy Weekly Progress Note  Patient Details  Name: Glenn Maldonado MRN: [id number] Date of Birth: [birth year]  Today's Date: 07/16/2012  Goals: STG's to become LTG's in next treatment week (Pt w/ anticipated d/c 07/22/12).  Patient continues to demonstrate the following deficits: Patient has met 4 of 5 short term goals.  He continues to demonstrate decreased independence w/ LB dressing, standing balance, A/ROM left UE, functional transfers and therefore will continue to benefit from skilled OT intervention to enhance overall performance with BADL.  Patient is progressing towards long term goals.. Continue plan of care.    Therapy Documentation Precautions:  Precautions Precautions: Fall Precaution Comments: Left side hemiparesis Restrictions Weight Bearing Restrictions: No   Pain: Pain Assessment Pain Assessment: No/denies pain     See FIM for current functional status  Alm Bustard 07/16/2012, 12:45 PM

## 2012-07-16 NOTE — Progress Notes (Signed)
Physical Therapy Weekly Progress Note  Patient Details  Name: Glenn Maldonado MRN: [id number] Date of Birth: 02/28/1961  Today's Date: 07/16/2012 Time: 1020-1105 Time Calculation (min): 45 min  Patient has met 3 of 5 short term goals.  He is progressing in the areas of transfers and w/c propulsion as well.  Patient continues to demonstrate the following deficits: strength, balance, vestibular function,  muscle sequencing and timing, muscle tone,  motor planning, awareness and therefore will continue to benefit from skilled PT intervention to enhance overall performance with activity tolerance, balance, postural control, ability to compensate for deficits, functional use of  left upper extremity and left lower extremity and awareness.  Patient progressing toward long term goals..  Continue plan of care.  PT Short Term Goals Week 2:  PT Short Term Goal 1 (Week 2): Pt will perform all bed mobility with supervision, VCs. PT Short Term Goal 1 - Progress (Week 2): Met PT Short Term Goal 2 (Week 2): Pt will transfer L and R with min assist consistently. PT Short Term Goal 2 - Progress (Week 2): Progressing toward goal PT Short Term Goal 3 (Week 2): Pt will propel w/c x 50' in congested home environment with supervision. PT Short Term Goal 3 - Progress (Week 2): Progressing toward goal PT Short Term Goal 4 (Week 2): Pt will perform gait x 25' with assistance of 1 person. PT Short Term Goal 4 - Progress (Week 2): Met PT Short Term Goal 5 (Week 2): Pt will ascend and descend 1 step with mod assist. PT Short Term Goal 5 - Progress (Week 2): Met  Skilled Therapeutic Interventions/Progress Updates:  Pt reported not sleeping well, with difficulty moving in the bed to get comfortable.  Neuromuscular re-education via tactile and VCs, for bed mobility to activate L trunk muscles; rolling R with VCs for sequencing, without rail,  Rolling L with supervision.  L sidelying > to sit with tactile cues, min  assist to elevate trunk.  Eye gaze stabilization ex 3  in sitting, horizontal head movements; cues to increase speed in order to elicite vertigo.  With quick head movements, pt had vertigo with first trial, not 2nd.  neuromuscular re-education in standing via forced use, VCs, manual cues, tactile cues for L hip stability with R foot stepping, x 10, L foot placement with hip abduction x 10.  Gait with RW x 70' with 2 turns to R, with mod assist, focusing on upright posture, forward gaze, L foot placement.  Pt states he feels he needs to look at his L foot to place it; educated on biomechanics of upright stance for improved L hip flexion.  LLE clonus noted during gait, interfering with placement of foot, coordination.  Bed> w/c stand/pivot to R with min assist.      Therapy Documentation Precautions:  Precautions Precautions: Fall Precaution Comments: Left side hemiparesis Restrictions Weight Bearing Restrictions: No   Pain: Pain Assessment Pain Assessment: No/denies pain   Other Treatments:  Treatments Neuromuscular Facilitation: Left;Lower Extremity;Forced use;Activity to increase motor control;Activity to increase sustained activation;Activity to increase grading;Activity to increase timing and sequencing;Limitations Neuromuscular Facilitation Limitations: knee wobble if blocked by PT from hyperextension   See FIM for current functional status  Therapy/Group: Individual Therapy  Daevion Navarette 07/16/2012, 4:01 PM

## 2012-07-16 NOTE — Progress Notes (Signed)
Speech Language Pathology Daily Session Note  Patient Details  Name: Glenn Maldonado MRN: [id number] Date of Birth: 1961/08/17  Today's Date: 07/16/2012 Time: 1213-1235 Time Calculation (min): 32 min  Short Term Goals: Week 2: SLP Short Term Goal 1 (Week 2): Patient will consume Dys.2 textures and honey-thick liquids with supervision level verbal cues to utilize compensatory strategies which prevent overt s/s of aspiraiton  SLP Short Term Goal 2 (Week 2): Patient will perform pharyngeal strengthening and diaphragmatic breahting exercises with min assist verbal and visual cues. SLP Short Term Goal 3 (Week 2): Patient will monitor and self correct speech intelligibility during conversation with modified independence.  SLP Short Term Goal 4 (Week 2): Patient will solve moderately complex problems with supervision level verbal cues.  Skilled Therapeutic Interventions:   Pt solved verbally presented moderately complex money word problems 85% accurate with minimal verbal cues and scratch paper provided for pt to work out some problems on paper. Pt wrote out checks, balanced check register (with whole numbers, no change) and solved verbal problem (you don't have enough to pay utilities, what would you do?) Pt was 100% accurate with extended time and supervision cues. FIM:  Comprehension Comprehension: 5-Understands complex 90% of the time/Cues < 10% of the time Expression Expression: 5-Expresses basic needs/ideas: With no assist Social Interaction Social Interaction: 6-Interacts appropriately with others with medication or extra time (anti-anxiety, antidepressant). Problem Solving Problem Solving: 5-Solves basic problems: With no assist Memory Memory: 5-Recognizes or recalls 90% of the time/requires cueing < 10% of the time FIM - Eating Eating Activity: 5: Supervision/cues  Pain Pain Assessment Pain Assessment: No/denies pain  Therapy/Group: Individual Therapy  [name], [name] 07/16/2012, 1:13 PM

## 2012-07-17 ENCOUNTER — Inpatient Hospital Stay (HOSPITAL_COMMUNITY): Payer: Medicaid - Out of State | Admitting: Speech Pathology

## 2012-07-17 ENCOUNTER — Inpatient Hospital Stay (HOSPITAL_COMMUNITY): Payer: Medicaid - Out of State

## 2012-07-17 ENCOUNTER — Inpatient Hospital Stay (HOSPITAL_COMMUNITY): Payer: Medicaid - Out of State | Admitting: Occupational Therapy

## 2012-07-17 ENCOUNTER — Inpatient Hospital Stay (HOSPITAL_COMMUNITY): Payer: Medicaid - Out of State | Admitting: *Deleted

## 2012-07-17 DIAGNOSIS — I10 Essential (primary) hypertension: Secondary | ICD-10-CM

## 2012-07-17 DIAGNOSIS — I6330 Cerebral infarction due to thrombosis of unspecified cerebral artery: Secondary | ICD-10-CM

## 2012-07-17 DIAGNOSIS — G8110 Spastic hemiplegia affecting unspecified side: Secondary | ICD-10-CM

## 2012-07-17 DIAGNOSIS — Z5189 Encounter for other specified aftercare: Secondary | ICD-10-CM

## 2012-07-17 LAB — GLUCOSE, CAPILLARY
Glucose-Capillary: 154 mg/dL — ABNORMAL HIGH (ref 70–99)
Glucose-Capillary: 82 mg/dL (ref 70–99)

## 2012-07-17 NOTE — Progress Notes (Signed)
Physical Therapy Session Note  Patient Details  Name: Glenn Maldonado MRN: [id number] Date of Birth: 11/06/1960  Today's Date: 07/17/2012 Time: 0915-1000 Time Calculation (min): 45 min  Short Term Goals: same as LTGs  Skilled Therapeutic Interventions/Progress Updates:     Pt reported that he vomited his breakfast; still felt slightly nauseated, and wanted to stay in bed.  Therapist returned a few minutes later and pt willing to participate.  Pt had no feelings of vertigo before vomiting.  Bed mobility supine> sit to L, pt remembering to activate abdominals to rotate hips over into sidelying, without VCs.  L sidelying> sit with tactile cues; no c/o dizziness.  In sitting, neuromuscular re-education L UE through wt bearing, manual cues for L lateral leans, for L pelvid protraction with tactile cues.  Sitting without foot support, trunk shortening/lengthening for postural reactions with good response on L trunk.  Eye gaze stabilization ex x 3 trials of fast horizontal head movements, with slight vertigo elicited only with very fast movement; no c/o dizziness upon sitting up or standing.    Neuromuscular re-education via forced use, tactile cues VCs for LLE bias during sit><stand x 5, min guard assist  Gait with LPFRW x 50' including 2 turns to L, with max VCs, mod assist for RW placement.    Therapy Documentation Precautions:  Precautions Precautions: Fall Precaution Comments: Left side hemiparesis Restrictions Weight Bearing Restrictions: No General: Amount of Missed PT Time (min): 15 Minutes Missed Time Reason: Patient ill (comment) (pt vomited before PT, and still felt nauseated)    Pain: Pain Assessment No pain reported            See FIM for current functional status  Therapy/Group: Individual Therapy  Charle Mclaurin 07/17/2012, 3:01 PM

## 2012-07-17 NOTE — Progress Notes (Signed)
Occupational Therapy Session Note  Patient Details  Name: Glenn Maldonado MRN: [id number] Date of Birth: April 30, 1961  Today's Date: 07/17/2012 Time:  -   0800-0900  (60 min)    Short Term Goals: Week 1:  OT Short Term Goal 1 (Week 1): Patient will increase grooming to Min Assist sitting at [employer].  OT Short Term Goal 1 - Progress (Week 1): Met OT Short Term Goal 2 (Week 1): Patient will increase UB dressing to Min Assist with hemi-dressing techniques. OT Short Term Goal 2 - Progress (Week 1): Met OT Short Term Goal 3 (Week 1): Patient will increase LB bathing to Mod Assist with long handled sponge. OT Short Term Goal 3 - Progress (Week 1): Progressing toward goal OT Short Term Goal 4 (Week 1): Patient will increase LB Dressing to Max Assist with hemi dressing techniques.  OT Short Term Goal 4 - Progress (Week 1): Met OT Short Term Goal 5 (Week 1): Patient will increase toilet transfer to Max Assist with grab bar and elevated toilet seat. OT Short Term Goal 5 - Progress (Week 1): Met Week 2:  OT Short Term Goal 1 (Week 2): LTG= STG Week 3:     Skilled Therapeutic Interventions/Progress Updates:    Engagged in toilet transfers, toileting, sit to stand, dynamic balance tasks, LUE neuromuscular re education using NDT, strategies.  Pt. Needed verbal cues for positioning for transfer from wc to toilet (min. Assist).  Did lateral lean to left to clean periara with min facilitation.  Performed standing at sink with LUE in weight bearing with mod assist for positioning and alignment.  Pt's Left hand has increased tone this am and needed mobilization. Pt. Returned to bed after getting bathed and dressed.  Instructed pt to do hand stretches.     Therapy Documentation Precautions:  Precautions Precautions: Fall Precaution Comments: Left side hemiparesis Restrictions Weight Bearing Restrictions: No General:   Vital Signs: Oxygen Therapy O2 Device: None (Room air) Pain: Pain Assessment Pain  Assessment: 0-10 Pain Score:   2 ADL:       See FIM for current functional status  Therapy/Group: Individual Therapy  Humberto Seals 07/17/2012, 8:52 AM

## 2012-07-17 NOTE — Progress Notes (Signed)
Speech Language Pathology Daily Session Note & Weekly Progress Update  Patient Details  Name: Glenn Maldonado MRN: [id number] Date of Birth: Apr 02, 1961  Today's Date: 07/17/2012 Time: 1030-1100 Time Calculation (min): 30 min  Short Term Goals: Week 2: SLP Short Term Goal 1 (Week 2): Patient will consume Dys.2 textures and honey-thick liquids with supervision level verbal cues to utilize compensatory strategies which prevent overt s/s of aspiraiton  SLP Short Term Goal 2 (Week 2): Patient will perform pharyngeal strengthening and diaphragmatic breahting exercises with min assist verbal and visual cues. SLP Short Term Goal 3 (Week 2): Patient will monitor and self correct speech intelligibility during conversation with modified independence.  SLP Short Term Goal 4 (Week 2): Patient will solve moderately complex problems with supervision level verbal cues.  Skilled Therapeutic Interventions: Treatment session focused on addressing dysphagia goals.  Patient and RN report that he took his medication whole with water and displayed no overt s/s of aspiration.  SLP facilitated session with trials of mixed consistencies and patient demonstrated no overt s/s of aspiration and patient self-managed intermittent throat clear as a safe swallow compensatory strategy.      FIM:  Comprehension Comprehension Mode: Auditory Comprehension: 6-Follows complex conversation/direction: With extra time/assistive device Expression Expression Mode: Verbal Expression: 5-Expresses complex 90% of the time/cues < 10% of the time Social Interaction Social Interaction: 6-Interacts appropriately with others with medication or extra time (anti-anxiety, antidepressant). Problem Solving Problem Solving: 5-Solves complex 90% of the time/cues < 10% of the time Memory Memory: 6-More than reasonable amt of time FIM - Eating Eating Activity: 6: Swallowing techniques: self-managed  Pain Pain Assessment Pain Assessment:  No/denies pain  Therapy/Group: Individual Therapy   Speech Language Pathology Weekly Progress Note  Patient Details  Name: Glenn Maldonado MRN: [id number] Date of Birth: January 28, 1961  Today's Date: 07/17/2012  Short Term Goals: Week 2: SLP Short Term Goal 1 (Week 2): Patient will consume Dys.2 textures and honey-thick liquids with supervision level verbal cues to utilize compensatory strategies which prevent overt s/s of aspiraiton  SLP Short Term Goal 1 - Progress (Week 2): Met SLP Short Term Goal 2 (Week 2): Patient will perform pharyngeal strengthening and diaphragmatic breahting exercises with min assist verbal and visual cues. SLP Short Term Goal 2 - Progress (Week 2): Met SLP Short Term Goal 3 (Week 2): Patient will monitor and self correct speech intelligibility during conversation with modified independence.  SLP Short Term Goal 3 - Progress (Week 2): Progressing toward goal SLP Short Term Goal 4 (Week 2): Patient will solve moderately complex problems with supervision level verbal cues. SLP Short Term Goal 4 - Progress (Week 2): Met Week 3: SLP Short Term Goal 1 (Week 3): Patient will monitor and self correct speech intelligibility during conversation with modified independence.  SLP Short Term Goal 2 (Week 3): Patient will solve moderately complex problems with modified independence.  Weekly Progress Updates: Patient met 3 out of 4 short term objectsives this reporting period with gains in swallow function, speech intelligibility and cognition.  Patient progressed from a Dys.2 texture diet with honey-thick liquids to a regular textures diet with thin liquids due to increased cognitive abilities, strength in pharyngeal muscles as well as improved glottic closure.  All dysphagia goals have been met and are discharged at this time. Patient also made gains in speech intelligibility and cognition with complex tasks with overall supervision level semantic cues.  This week goals for complex  problem solving and speech intelligibility are set for the Mod  I level to maximize functional independence and reduce burden of care prior to discharge.  At this time SLP suspects no follow-up will be warranted.   SLP Frequency: 1-2 X/day, 30-60 minutes;5 out of 7 days Estimated Length of Stay: discharge 07/22/12 SLP Treatment/Interventions: Cognitive remediation/compensation;Cueing hierarchy;Dysphagia/aspiration precaution training;Environmental controls;Functional tasks;Internal/external aids;Medication managment;Oral motor exercises;Patient/family education;Speech/Language facilitation;Therapeutic Activities;Therapeutic Exercise  Charlane Ferretti., CCC-SLP [phone]  Glenn Maldonado 07/17/2012, 11:23 AM

## 2012-07-17 NOTE — Progress Notes (Signed)
Social Work Patient ID: Glenn Maldonado, male   DOB: 12-09-1960, 51 y.o.   MRN: [id number] Met with pt and wife to discuss team conference progression toward goals and discharge still 9/18.  Wife saw lawyer [name]  and got her questions answered regarding medicaid, SSD and POA.  Discussed follow up therapies would be willing to go to [facility] For follow-up therapies.  Will get DME prior to discharge.  Continue to work toward discharge date.

## 2012-07-17 NOTE — Progress Notes (Signed)
Patient ID: Glenn Maldonado, male   DOB: 04/03/1961, 51 y.o.   MRN: [id number]  Subjective/Complaints: Bad dream last noc but no other C/os Review of Systems  Neurological: Positive for speech change and focal weakness. Negative for sensory change.  All other systems reviewed and are negative.    Objective: Vital Signs: Blood pressure 145/92, pulse 72, temperature 98.1 F (36.7 C), temperature source Oral, resp. rate 18, height 5\' 10"  (1.778 m), weight 77.1 kg (169 lb 15.6 oz), SpO2 99.00%. No results found. Results for orders placed during the hospital encounter of 07/01/12 (from the past 72 hour(s))  GLUCOSE, CAPILLARY     Status: Abnormal   Collection Time   07/14/12 11:20 AM      Component Value Range Comment   Glucose-Capillary 109 (*) 70 - 99 mg/dL    Comment 1 Notify RN     GLUCOSE, CAPILLARY     Status: Abnormal   Collection Time   07/14/12  4:27 PM      Component Value Range Comment   Glucose-Capillary 109 (*) 70 - 99 mg/dL    Comment 1 Notify RN     GLUCOSE, CAPILLARY     Status: Abnormal   Collection Time   07/14/12  9:02 PM      Component Value Range Comment   Glucose-Capillary 133 (*) 70 - 99 mg/dL    Comment 1 Notify RN     GLUCOSE, CAPILLARY     Status: Abnormal   Collection Time   07/15/12  7:41 AM      Component Value Range Comment   Glucose-Capillary 104 (*) 70 - 99 mg/dL    Comment 1 Notify RN     GLUCOSE, CAPILLARY     Status: Abnormal   Collection Time   07/15/12 11:24 AM      Component Value Range Comment   Glucose-Capillary 127 (*) 70 - 99 mg/dL    Comment 1 Notify RN     GLUCOSE, CAPILLARY     Status: Abnormal   Collection Time   07/15/12  5:03 PM      Component Value Range Comment   Glucose-Capillary 100 (*) 70 - 99 mg/dL    Comment 1 Notify RN     GLUCOSE, CAPILLARY     Status: Abnormal   Collection Time   07/15/12  9:00 PM      Component Value Range Comment   Glucose-Capillary 105 (*) 70 - 99 mg/dL    Comment 1 Notify RN     GLUCOSE,  CAPILLARY     Status: Normal   Collection Time   07/16/12  7:19 AM      Component Value Range Comment   Glucose-Capillary 91  70 - 99 mg/dL    Comment 1 Notify RN     GLUCOSE, CAPILLARY     Status: Abnormal   Collection Time   07/16/12 11:25 AM      Component Value Range Comment   Glucose-Capillary 118 (*) 70 - 99 mg/dL    Comment 1 Notify RN     GLUCOSE, CAPILLARY     Status: Abnormal   Collection Time   07/16/12  4:32 PM      Component Value Range Comment   Glucose-Capillary 100 (*) 70 - 99 mg/dL   GLUCOSE, CAPILLARY     Status: Abnormal   Collection Time   07/16/12  9:01 PM      Component Value Range Comment   Glucose-Capillary 566 (*) 70 - 99 mg/dL  Comment 1 Notify RN     GLUCOSE, CAPILLARY     Status: Normal   Collection Time   07/16/12  9:03 PM      Component Value Range Comment   Glucose-Capillary 88  70 - 99 mg/dL    Comment 1 Notify RN        HEENT: poor dentition Cardio: RRR Resp: CTA B/L after clears with cough otherwise upper airway sounds GI: BS positive Extremity:  Edema mild L dorsum hand Skin:   Intact Neuro: Lethargic, Cranial Nerve Abnormalities R central 7, Abnormal Motor 1/5 L FF,2/5 L deltoid, biceps and triceps,LE 2-/5 hip knee extensor synergy, trace toes Abnormal FMC Ataxic/ dec FMC and Tone  increased extensor tone in LUE, Tone:  Hypertonia and Dysarthric Musc/Skel:  Normal   Assessment/Plan: 1. 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 Physiatrist is providing close team supervision and 24 hour management of active medical problems listed below. Physiatrist and rehab team continue to assess barriers to discharge/monitor patient progress toward functional and medical goals. FIM: FIM - Bathing Bathing Steps Patient Completed: Chest;Right Arm;Left Arm;Abdomen;Front perineal area;Buttocks;Right upper leg;Left upper leg;Right  lower leg (including foot);Left lower leg (including foot) Bathing: 4: Min-Patient completes 8-9 29f 10 parts or 75+ percent  FIM - Upper Body Dressing/Undressing Upper body dressing/undressing steps patient completed: Thread/unthread right sleeve of pullover shirt/dresss;Thread/unthread left sleeve of pullover shirt/dress;Put head through opening of pull over shirt/dress;Pull shirt over trunk Upper body dressing/undressing: 5: Set-up assist to: Obtain clothing/put away FIM - Lower Body Dressing/Undressing Lower body dressing/undressing steps patient completed: Thread/unthread right underwear leg;Thread/unthread left underwear leg;Pull underwear up/down;Thread/unthread right pants leg;Thread/unthread left pants leg;Pull pants up/down;Don/Doff right sock;Don/Doff left sock;Don/Doff right shoe;Don/Doff left shoe Lower body dressing/undressing: 4: Steadying Assist  FIM - Toileting Toileting steps completed by patient: Performs perineal hygiene;Adjust clothing after toileting Toileting Assistive Devices: Grab bar or rail for support Toileting: 3: Mod-Patient completed 2 of 3 steps  FIM - Archivist Transfers: 3-From toilet/BSC: Mod A (lift or lower assist)  FIM - Banker Devices: Bed rails Bed/Chair Transfer: 3: Chair or W/C > Bed: Mod A (lift or lower assist);4: Bed > Chair or W/C: Min A (steadying Pt. > 75%) (lift assist)  FIM - Locomotion: Wheelchair Distance: 160 feet Locomotion: Wheelchair: 4: Travels 150 ft or more: maneuvers on rugs and over door sillls with minimal assistance (Pt.>75%) FIM - Locomotion: Ambulation Locomotion: Ambulation Assistive Devices: TEFL teacher;Walker - Rolling Ambulation/Gait Assistance: 2: Max assist Locomotion: Ambulation: 2: Travels 50 - 149 ft with maximal assistance (Pt: 25 - 49%) (80 feet)  Comprehension Comprehension Mode: Auditory Comprehension: 6-Follows complex conversation/direction: With  extra time/assistive device  Expression Expression Mode: Verbal Expression: 5-Expresses complex 90% of the time/cues < 10% of the time  Social Interaction Social Interaction: 5-Interacts appropriately 90% of the time - Needs monitoring or encouragement for participation or interaction.  Problem Solving Problem Solving: 5-Solves complex 90% of the time/cues < 10% of the time  Memory Memory: 5-Recognizes or recalls 90% of the time/requires cueing < 10% of the time  Medical Problem List and Plan:  1. DVT Prophylaxis/Anticoagulation: Pharmaceutical: Lovenox  2. Pain Management: Prn ultram for headaches.  3. Mood: will need a lot of ego support as high levels of frustration noted. Xanax prn 4. Neuropsych: This patient is capable of making decisions on his/her own behalf.  5. STEMI/Non-obstructiveCAD:  Treated medically. Ace initiated for BP control.  6. Non-ischemic CM: HTN v/s CVA related.  7. Borderline DM :Hgb A1C at 6.8. Continue CBG checks AC/HS with SSI. Isolated hi reading >500 likely error because recheck on other hand was normal 8. HTN: Will monitor with bid checks. Cozaar not covered by $4 plan will trial Lisinopril  also need HCTZ  9. Dyslipidemia: continue lipitor. But will need pravachol at home due to $4 plan 10. Leucocytosis:  UA/UCS -, afeb monitor 11. CTS mild conservative care  LOS (Days) 16 A FACE TO FACE EVALUATION WAS PERFORMED  Terressa Evola E 07/17/2012, 7:34 AM

## 2012-07-17 NOTE — Progress Notes (Signed)
Occupational Therapy Session Note  Patient Details  Name: Glenn Maldonado MRN: [id number] Date of Birth: Jan 15, 1961  Today's Date: 07/17/2012 Time: 1308-6578 Time Calculation (min): 30 min  Short Term Goals: Week 1:  OT Short Term Goal 1 (Week 1): Patient will increase grooming to Min Assist sitting at [employer].  OT Short Term Goal 1 - Progress (Week 1): Met OT Short Term Goal 2 (Week 1): Patient will increase UB dressing to Min Assist with hemi-dressing techniques. OT Short Term Goal 2 - Progress (Week 1): Met OT Short Term Goal 3 (Week 1): Patient will increase LB bathing to Mod Assist with long handled sponge. OT Short Term Goal 3 - Progress (Week 1): Progressing toward goal OT Short Term Goal 4 (Week 1): Patient will increase LB Dressing to Max Assist with hemi dressing techniques.  OT Short Term Goal 4 - Progress (Week 1): Met OT Short Term Goal 5 (Week 1): Patient will increase toilet transfer to Max Assist with grab bar and elevated toilet seat. OT Short Term Goal 5 - Progress (Week 1): Met Week 2:  OT Short Term Goal 1 (Week 2): LTG= STG      Skilled Therapeutic Interventions/Progress Updates:    Pt seen for LUE neuro muscular re-ed with a focus on gravity eliminated movement for shoulder flex, ext, and abd.  Pt was able to pull shoulder into extension with slight resistance.  Grasping/ releasing with facilitation to wrist extensors. No active wrist or finger extension, but patient is able to grasp loosely on objects.  Family ed with wife on activities that can be done in the room.    Therapy Documentation Precautions:  Precautions Precautions: Fall Precaution Comments: Left side hemiparesis Restrictions Weight Bearing Restrictions: No    Vital Signs: Oxygen Therapy O2 Device: None (Room air) Pain: Pain Assessment Pain Assessment: No/denies pain ADL: ADL Eating: Not assessed Grooming: Maximal assistance Where Assessed-Grooming: Sitting at sink Upper Body Bathing:  Minimal assistance Where Assessed-Upper Body Bathing: Sitting at sink Lower Body Bathing: Maximal assistance Where Assessed-Lower Body Bathing: Sitting at sink;Standing at sink Upper Body Dressing: Moderate assistance Where Assessed-Upper Body Dressing: Sitting at sink Lower Body Dressing: Dependent Where Assessed-Lower Body Dressing: Standing at sink;Sitting at sink Toileting: Not assessed ADL Comments: Patient needed assist with static sitting balance, vc's for technique, patient became frustrated several times with performance during ADL. See FIM for current functional status  Therapy/Group: Individual Therapy  SAGUIER,JULIA 07/17/2012, 11:51 AM

## 2012-07-18 ENCOUNTER — Inpatient Hospital Stay (HOSPITAL_COMMUNITY): Payer: Self-pay

## 2012-07-18 LAB — GLUCOSE, CAPILLARY
Glucose-Capillary: 93 mg/dL (ref 70–99)
Glucose-Capillary: 80 mg/dL (ref 70–99)

## 2012-07-18 NOTE — Progress Notes (Signed)
Physical Therapy Session Note  Patient Details  Name: Glenn Maldonado MRN: [id number] Date of Birth: 02/24/1961  Today's Date: 07/18/2012 Time: 9562-1308 Time Calculation (min): 45 min  Short Term Goals:  Week 3:  PT Short Term Goal 1 (Week 3): same as LTGs  Skilled Therapeutic Interventions/Progress Updates:  Bed mobility supine> sit from L sidelyoing with 1 VC for technique.  Sat EOB x 5 minutes to wake up, without UE support, balance superviison.  Dynbamci sitting  Balance o use urinal sitting EOB with close supervision/min assist for mild LOB L.  Bed> w/c stand pivot transfer to R, min guard assist.  W/c propulsion using hemi technique with RUE and RLE x 150' inclduing 50' on carpet in home environment, supervision.  Gait with LPFRW x 80' on tile and carpet, min assist > mod assist as pt fatigued.  Focus on upright posture,. Forward gaze, sequencing.  Clonus L hamstrings started after about 50', affecting pt's L foot placement.  neuromuscular re-education on Kinetron via forced use, visual feedback for LLE knee/hip strengthening, x 30 cycles, x 3 resting between.     Therapy Documentation Precautions:  Precautions Precautions: Fall Precaution Comments: Left side hemiparesis Restrictions Weight Bearing Restrictions: No  Pain: Pain Assessment Pain Assessment: 0-10 Pain Score:   1 Pain Type: Acute pain Pain Location: Shoulder Pain Orientation: Left Pain Descriptors: Aching Pain Intervention(s): Ambulation/increased activity     See FIM for current functional status  Therapy/Group: Individual Therapy  Faiza Bansal 07/18/2012, 9:26 AM

## 2012-07-18 NOTE — Progress Notes (Signed)
Patient ID: Glenn Maldonado, male   DOB: 1961-09-09, 51 y.o.   MRN: [id number] Patient ID: Glenn Maldonado, male   DOB: [birth year], 51 y.o.   MRN: [id number]  Subjective/Complaints: 9/14.  Feels well and offers no complaints. Uneventful night  Chest clear to auscultation. Cardiac exam normal heart sounds. Abdomen benign no distention or tenderness. Extremities no edema. Left arm weakness Review of Systems  Neurological: Positive for speech change and focal weakness. Negative for sensory change.  All other systems reviewed and are negative.   CBG (last 3)   Basename 07/18/12 0735 07/17/12 2117 07/17/12 1645  GLUCAP 93 82 154*     Objective: Vital Signs: Blood pressure 136/73, pulse 58, temperature 97.3 F (36.3 C), temperature source Oral, resp. rate 19, height 5\' 10"  (1.778 m), weight 77.1 kg (169 lb 15.6 oz), SpO2 98.00%. No results found. Results for orders placed during the hospital encounter of 07/01/12 (from the past 72 hour(s))  GLUCOSE, CAPILLARY     Status: Abnormal   Collection Time   07/15/12 11:24 AM      Component Value Range Comment   Glucose-Capillary 127 (*) 70 - 99 mg/dL    Comment 1 Notify RN     GLUCOSE, CAPILLARY     Status: Abnormal   Collection Time   07/15/12  5:03 PM      Component Value Range Comment   Glucose-Capillary 100 (*) 70 - 99 mg/dL    Comment 1 Notify RN     GLUCOSE, CAPILLARY     Status: Abnormal   Collection Time   07/15/12  9:00 PM      Component Value Range Comment   Glucose-Capillary 105 (*) 70 - 99 mg/dL    Comment 1 Notify RN     GLUCOSE, CAPILLARY     Status: Normal   Collection Time   07/16/12  7:19 AM      Component Value Range Comment   Glucose-Capillary 91  70 - 99 mg/dL    Comment 1 Notify RN     GLUCOSE, CAPILLARY     Status: Abnormal   Collection Time   07/16/12 11:25 AM      Component Value Range Comment   Glucose-Capillary 118 (*) 70 - 99 mg/dL    Comment 1 Notify RN     GLUCOSE, CAPILLARY     Status: Abnormal   Collection Time   07/16/12  4:32 PM      Component Value Range Comment   Glucose-Capillary 100 (*) 70 - 99 mg/dL   GLUCOSE, CAPILLARY     Status: Abnormal   Collection Time   07/16/12  9:01 PM      Component Value Range Comment   Glucose-Capillary 566 (*) 70 - 99 mg/dL    Comment 1 Notify RN     GLUCOSE, CAPILLARY     Status: Normal   Collection Time   07/16/12  9:03 PM      Component Value Range Comment   Glucose-Capillary 88  70 - 99 mg/dL    Comment 1 Notify RN     GLUCOSE, CAPILLARY     Status: Normal   Collection Time   07/17/12  7:41 AM      Component Value Range Comment   Glucose-Capillary 96  70 - 99 mg/dL   GLUCOSE, CAPILLARY     Status: Abnormal   Collection Time   07/17/12 11:18 AM      Component Value Range Comment   Glucose-Capillary 112 (*) 70 - 99  mg/dL   GLUCOSE, CAPILLARY     Status: Abnormal   Collection Time   07/17/12  4:45 PM      Component Value Range Comment   Glucose-Capillary 154 (*) 70 - 99 mg/dL    Comment 1 Notify RN     GLUCOSE, CAPILLARY     Status: Normal   Collection Time   07/17/12  9:17 PM      Component Value Range Comment   Glucose-Capillary 82  70 - 99 mg/dL    Comment 1 Notify RN     GLUCOSE, CAPILLARY     Status: Normal   Collection Time   07/18/12  7:35 AM      Component Value Range Comment   Glucose-Capillary 93  70 - 99 mg/dL    Comment 1 Notify RN        HEENT: poor dentition Cardio: RRR Resp: CTA B/L after clears with cough otherwise upper airway sounds GI: BS positive Extremity:  Edema mild L dorsum hand Skin:   Intact Neuro: Lethargic, Cranial Nerve Abnormalities R central 7, Abnormal Motor 1/5 L FF,2/5 L deltoid, biceps and triceps,LE 2-/5 hip knee extensor synergy, trace toes Abnormal FMC Ataxic/ dec FMC and Tone  increased extensor tone in LUE, Tone:  Hypertonia and Dysarthric Musc/Skel:  Normal   Assessment/Plan: 1. 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 which require 3+ hours per day of  interdisciplinary therapy in a comprehensive inpatient rehab setting. Physiatrist is providing close team supervision and 24 hour management of active medical problems listed below. Physiatrist and rehab team continue to assess barriers to discharge/monitor patient progress toward functional and medical goals. FIM: FIM - Bathing Bathing Steps Patient Completed: Chest;Right Arm;Left Arm;Abdomen;Front perineal area;Buttocks;Right upper leg;Left upper leg;Right lower leg (including foot);Left lower leg (including foot) Bathing: 4: Steadying assist  FIM - Upper Body Dressing/Undressing Upper body dressing/undressing steps patient completed: Thread/unthread right sleeve of pullover shirt/dresss;Thread/unthread left sleeve of pullover shirt/dress;Put head through opening of pull over shirt/dress;Pull shirt over trunk Upper body dressing/undressing: 5: Set-up assist to: Obtain clothing/put away FIM - Lower Body Dressing/Undressing Lower body dressing/undressing steps patient completed: Thread/unthread right underwear leg;Thread/unthread left underwear leg;Pull underwear up/down;Thread/unthread right pants leg;Thread/unthread left pants leg;Pull pants up/down;Don/Doff right sock;Don/Doff left sock;Don/Doff right shoe;Don/Doff left shoe Lower body dressing/undressing: 4: Min-Patient completed 75 plus % of tasks  FIM - Toileting Toileting steps completed by patient: Adjust clothing prior to toileting;Performs perineal hygiene Toileting Assistive Devices: Grab bar or rail for support Toileting: 3: Mod-Patient completed 2 of 3 steps  FIM - Diplomatic Services operational officer Devices: Grab bars Toilet Transfers: 4-To toilet/BSC: Min A (steadying Pt. > 75%);4-From toilet/BSC: Min A (steadying Pt. > 75%)  FIM - Bed/Chair Transfer Bed/Chair Transfer Assistive Devices: Bed rails Bed/Chair Transfer: 5: Supine > Sit: Supervision (verbal cues/safety issues);4: Bed > Chair or W/C: Min A (steadying Pt. >  75%);4: Chair or W/C > Bed: Min A (steadying Pt. > 75%)  FIM - Locomotion: Wheelchair Distance: 160 feet Locomotion: Wheelchair: 5: Travels 150 ft or more: maneuvers on rugs and over door sills with supervision, cueing or coaxing FIM - Locomotion: Ambulation Locomotion: Ambulation Assistive Devices: TEFL teacher;Walker - Rolling Ambulation/Gait Assistance: 2: Max assist Locomotion: Ambulation: 2: Travels 50 - 149 ft with moderate assistance (Pt: 50 - 74%)  Comprehension Comprehension Mode: Auditory Comprehension: 6-Follows complex conversation/direction: With extra time/assistive device  Expression Expression Mode: Verbal Expression: 5-Expresses complex 90% of the time/cues < 10% of the time  Social Interaction Social Interaction: 6-Interacts appropriately with others with medication or extra time (anti-anxiety, antidepressant).  Problem Solving Problem Solving: 5-Solves complex 90% of the time/cues < 10% of the time  Memory Memory: 5-Recognizes or recalls 90% of the time/requires cueing < 10% of the time  Medical Problem List and Plan:  1. DVT Prophylaxis/Anticoagulation: Pharmaceutical: Lovenox  2. Pain Management: Prn ultram for headaches.  3. Mood: will need a lot of ego support as high levels of frustration noted. Xanax prn 4. Neuropsych: This patient is capable of making decisions on his/her own behalf.  5. STEMI/Non-obstructiveCAD: Treated medically. Ace initiated for BP control.  6. Non-ischemic CM: HTN v/s CVA related.  7. Borderline DM :Hgb A1C at 6.8. Continue CBG checks AC/HS with SSI. Isolated hi reading >500 likely error because recheck on other hand was normal 8. HTN: Will monitor with bid checks. Cozaar not covered by $4 plan will trial Lisinopril  also need HCTZ  9. Dyslipidemia: continue lipitor. But will need pravachol at home due to $4 plan 10. Leucocytosis:  UA/UCS -, afeb monitor 11. CTS mild conservative care  LOS (Days) 17 A FACE TO FACE EVALUATION  WAS PERFORMED  Rogelia Boga 07/18/2012, 9:31 AM

## 2012-07-18 NOTE — Progress Notes (Signed)
Orthopedic Tech Progress Note Patient Details:  Glenn Maldonado [birth year] [id number]  Patient ID: [name], male   DOB: Aug 14, 1961, 51 y.o.   MRN: [id number] Acknowledged and completed order. Pt RN expressed there may have been an error in ordering wrist splint for pt's RIGHT hand. Splint has not been delivered.    Corin Tilly T 07/18/2012, 11:40 AM

## 2012-07-19 ENCOUNTER — Inpatient Hospital Stay (HOSPITAL_COMMUNITY): Payer: Medicaid - Out of State | Admitting: *Deleted

## 2012-07-19 LAB — GLUCOSE, CAPILLARY: Glucose-Capillary: 91 mg/dL (ref 70–99)

## 2012-07-19 NOTE — Progress Notes (Signed)
Patient ID: Glenn Maldonado, male   DOB: [birth year], 51 y.o.   MRN: [id number] Patient ID: Glenn Maldonado, male   DOB: 10-Aug-1961, 51 y.o.   MRN: [id number] Patient ID: Glenn Maldonado, male   DOB: [birth year], 51 y.o.   MRN: [id number]  Subjective/Complaints: 9/15.   Feels well and offers no complaints. Comfortable night  Chest clear to auscultation. Cardiac exam normal heart sounds. Abdomen benign no distention or tenderness. Extremities no edema. Left arm weakness  Review of Systems  Neurological: Positive for speech change and focal weakness. Negative for sensory change.  All other systems reviewed and are negative.   CBG (last 3)   Basename 07/19/12 0743 07/18/12 2050 07/18/12 1626  GLUCAP 91 100* 80     Objective: Vital Signs: Blood pressure 130/76, pulse 70, temperature 97.4 F (36.3 C), temperature source Oral, resp. rate 18, height 5\' 10"  (1.778 m), weight 77.1 kg (169 lb 15.6 oz), SpO2 97.00%. No results found. Results for orders placed during the hospital encounter of 07/01/12 (from the past 72 hour(s))  GLUCOSE, CAPILLARY     Status: Abnormal   Collection Time   07/16/12 11:25 AM      Component Value Range Comment   Glucose-Capillary 118 (*) 70 - 99 mg/dL    Comment 1 Notify RN     GLUCOSE, CAPILLARY     Status: Abnormal   Collection Time   07/16/12  4:32 PM      Component Value Range Comment   Glucose-Capillary 100 (*) 70 - 99 mg/dL   GLUCOSE, CAPILLARY     Status: Abnormal   Collection Time   07/16/12  9:01 PM      Component Value Range Comment   Glucose-Capillary 566 (*) 70 - 99 mg/dL    Comment 1 Notify RN     GLUCOSE, CAPILLARY     Status: Normal   Collection Time   07/16/12  9:03 PM      Component Value Range Comment   Glucose-Capillary 88  70 - 99 mg/dL    Comment 1 Notify RN     GLUCOSE, CAPILLARY     Status: Normal   Collection Time   07/17/12  7:41 AM      Component Value Range Comment   Glucose-Capillary 96  70 - 99 mg/dL   GLUCOSE, CAPILLARY      Status: Abnormal   Collection Time   07/17/12 11:18 AM      Component Value Range Comment   Glucose-Capillary 112 (*) 70 - 99 mg/dL   GLUCOSE, CAPILLARY     Status: Abnormal   Collection Time   07/17/12  4:45 PM      Component Value Range Comment   Glucose-Capillary 154 (*) 70 - 99 mg/dL    Comment 1 Notify RN     GLUCOSE, CAPILLARY     Status: Normal   Collection Time   07/17/12  9:17 PM      Component Value Range Comment   Glucose-Capillary 82  70 - 99 mg/dL    Comment 1 Notify RN     GLUCOSE, CAPILLARY     Status: Normal   Collection Time   07/18/12  7:35 AM      Component Value Range Comment   Glucose-Capillary 93  70 - 99 mg/dL    Comment 1 Notify RN     GLUCOSE, CAPILLARY     Status: Abnormal   Collection Time   07/18/12 11:33 AM      Component Value Range  Comment   Glucose-Capillary 137 (*) 70 - 99 mg/dL    Comment 1 Notify RN     GLUCOSE, CAPILLARY     Status: Normal   Collection Time   07/18/12  4:26 PM      Component Value Range Comment   Glucose-Capillary 80  70 - 99 mg/dL    Comment 1 Notify RN     GLUCOSE, CAPILLARY     Status: Abnormal   Collection Time   07/18/12  8:50 PM      Component Value Range Comment   Glucose-Capillary 100 (*) 70 - 99 mg/dL    Comment 1 Notify RN     GLUCOSE, CAPILLARY     Status: Normal   Collection Time   07/19/12  7:43 AM      Component Value Range Comment   Glucose-Capillary 91  70 - 99 mg/dL    Comment 1 Notify RN        HEENT: poor dentition Cardio: RRR Resp: CTA B/L after clears with cough otherwise upper airway sounds GI: BS positive Extremity:  Edema mild L dorsum hand Skin:   Intact Neuro: Lethargic, Cranial Nerve Abnormalities R central 7, Abnormal Motor 1/5 L FF,2/5 L deltoid, biceps and triceps,LE 2-/5 hip knee extensor synergy, trace toes Abnormal FMC Ataxic/ dec FMC and Tone  increased extensor tone in LUE, Tone:  Hypertonia and Dysarthric Musc/Skel:  Normal   Assessment/Plan: 1. Functional deficits secondary to  R parietal thrombotic infarct which require 3+ hours per day of interdisciplinary therapy in a comprehensive inpatient rehab setting. Physiatrist is providing close team supervision and 24 hour management of active medical problems listed below. Physiatrist and rehab team continue to assess barriers to discharge/monitor patient progress toward functional and medical goals. FIM: FIM - Bathing Bathing Steps Patient Completed: Chest;Right Arm;Left Arm;Abdomen;Front perineal area;Buttocks;Right upper leg;Left upper leg;Right lower leg (including foot);Left lower leg (including foot) Bathing: 4: Steadying assist  FIM - Upper Body Dressing/Undressing Upper body dressing/undressing steps patient completed: Thread/unthread right sleeve of pullover shirt/dresss;Thread/unthread left sleeve of pullover shirt/dress;Put head through opening of pull over shirt/dress;Pull shirt over trunk Upper body dressing/undressing: 5: Set-up assist to: Obtain clothing/put away FIM - Lower Body Dressing/Undressing Lower body dressing/undressing steps patient completed: Thread/unthread right underwear leg;Thread/unthread left underwear leg;Pull underwear up/down;Thread/unthread right pants leg;Thread/unthread left pants leg;Pull pants up/down;Don/Doff right sock;Don/Doff left sock;Don/Doff right shoe;Don/Doff left shoe Lower body dressing/undressing: 4: Min-Patient completed 75 plus % of tasks  FIM - Toileting Toileting steps completed by patient: Adjust clothing prior to toileting;Performs perineal hygiene Toileting Assistive Devices: Grab bar or rail for support Toileting: 3: Mod-Patient completed 2 of 3 steps  FIM - Diplomatic Services operational officer Devices: Grab bars Toilet Transfers: 4-To toilet/BSC: Min A (steadying Pt. > 75%);4-From toilet/BSC: Min A (steadying Pt. > 75%)  FIM - Bed/Chair Transfer Bed/Chair Transfer Assistive Devices: Bed rails Bed/Chair Transfer: 5: Supine > Sit: Supervision (verbal  cues/safety issues);4: Bed > Chair or W/C: Min A (steadying Pt. > 75%);4: Chair or W/C > Bed: Min A (steadying Pt. > 75%)  FIM - Locomotion: Wheelchair Distance: 160 feet Locomotion: Wheelchair: 5: Travels 150 ft or more: maneuvers on rugs and over door sills with supervision, cueing or coaxing FIM - Locomotion: Ambulation Locomotion: Ambulation Assistive Devices: TEFL teacher;Walker - Rolling Ambulation/Gait Assistance: 2: Max assist Locomotion: Ambulation: 2: Travels 50 - 149 ft with moderate assistance (Pt: 50 - 74%)  Comprehension Comprehension Mode: Auditory Comprehension: 6-Follows complex conversation/direction: With extra time/assistive device  Expression Expression Mode: Verbal Expression: 5-Expresses complex 90% of the time/cues < 10% of the time  Social Interaction Social Interaction: 6-Interacts appropriately with others with medication or extra time (anti-anxiety, antidepressant).  Problem Solving Problem Solving: 5-Solves complex 90% of the time/cues < 10% of the time  Memory Memory: 5-Recognizes or recalls 90% of the time/requires cueing < 10% of the time  Medical Problem List and Plan:  1. DVT Prophylaxis/Anticoagulation: Pharmaceutical: Lovenox  2. Pain Management: Prn ultram for headaches.  3. Mood: will need a lot of ego support as high levels of frustration noted. Xanax prn 4. Neuropsych: This patient is capable of making decisions on his/her own behalf.  5. STEMI/Non-obstructiveCAD: Treated medically. Ace initiated for BP control.  6. Non-ischemic CM: HTN v/s CVA related.  7. Borderline DM :Hgb A1C at 6.8. Continue CBG checks AC/HS with SSI. Isolated hi reading >500 likely error because recheck on other hand was normal 8. HTN: Will monitor with bid checks. Cozaar not covered by $4 plan will trial Lisinopril  also need HCTZ  9. Dyslipidemia: continue lipitor. But will need pravachol at home due to $4 plan 10. Leucocytosis:  UA/UCS -, afeb monitor 11. CTS  mild conservative care  LOS (Days) 18 A FACE TO FACE EVALUATION WAS PERFORMED  Rogelia Boga 07/19/2012, 9:06 AM

## 2012-07-19 NOTE — Progress Notes (Signed)
Physical Therapy Session Note  Patient Details  Name: Glenn Maldonado MRN: [id number] Date of Birth: 1961-03-18  Today's Date: 07/19/2012 Time: 4098-1191 Time Calculation (min): 41 min   Skilled Therapeutic Interventions/Progress Updates:  Tx focused on safe transfer trainng, WC mobility, and gait training with RW.  Pt propelled WC 1x200' with S only, navigating obstacles and busy environment with efficient hemi-technique.   Sit<>stand x6 with Min A and cues for safe hand placement and timing.   Gait training 1x120' and 1x100' with Mod A and L PFRW.  for stability, especially to assist with R weight-shift. Pt tends to place most weight on LUE during L swing, rather than R-side due to compensate for L LE weakness. Pt has most difficulty with R/L turns and retro-walking, moving unsafely fast at times.   Pre-gait training in PFRW at mirror for visual feedback. Pt performed step-taps with LLE to 4" step, focusing on achieving R-weight-shift prior to step with manual facilitation needed 75% of the time and Min A for steadying. Pt with decreased L step clearance when uncompensated for with LUE weight-bearing and rotation due to LLE weakness.  Pt demonstrated improved gait quality following task 30% of the time, but stated he was having difficulty focusing on all the components he needed to accomplish walking.   Pt very fatigued at end of tx, and needed 4 seated rest breaks after each task. Discussed increasing LE activation in room with AROM as able.   Seated HS stretch 2x35min on L with pt actively dorsi-flexing x3 to fatigue.       Therapy Documentation Precautions:  Precautions Precautions: Fall Precaution Comments: Left side hemiparesis Restrictions Weight Bearing Restrictions: No  Pain: 2/10 low back, modified therapies prn.       Locomotion : Ambulation Ambulation/Gait Assistance: 3: Mod assist  See FIM for current functional status  Therapy/Group: Individual  Therapy  Virl Cagey, PT 07/19/2012, 11:53 AM

## 2012-07-20 ENCOUNTER — Inpatient Hospital Stay (HOSPITAL_COMMUNITY): Payer: Medicaid - Out of State

## 2012-07-20 ENCOUNTER — Inpatient Hospital Stay (HOSPITAL_COMMUNITY): Payer: Medicaid - Out of State | Admitting: Speech Pathology

## 2012-07-20 ENCOUNTER — Inpatient Hospital Stay (HOSPITAL_COMMUNITY): Payer: Medicaid - Out of State | Admitting: *Deleted

## 2012-07-20 LAB — GLUCOSE, CAPILLARY
Glucose-Capillary: 102 mg/dL — ABNORMAL HIGH (ref 70–99)
Glucose-Capillary: 162 mg/dL — ABNORMAL HIGH (ref 70–99)

## 2012-07-20 NOTE — Progress Notes (Signed)
Physical Therapy Session Note  Patient Details  Name: Glenn Maldonado MRN: [id number] Date of Birth: Jun 14, 1961  Today's Date: 07/20/2012 Time: 9147-8295 Time Calculation (min): 55 min  Short Term Goals: See d/c goals  Skilled Therapeutic Interventions/Progress Updates:    NMR-sit to stands without UE support, squats and weight shifts in squatted position- encouragement provided as pt fearful of falling, single leg stance activity  Step up  on 6" step with RLE and kicking ball with R to improve L knee control with facilitation to prevent hyperextension; up/down steps with 1 UE support min A; wife present for PT  Therapy Documentation Precautions:  Precautions Precautions: Fall Precaution Comments: Left side hemiparesis Restrictions Weight Bearing Restrictions: No Pain:none    Exercises: supine and sidelying LLE there-ex for strength and motor control   Other Treatments: Treatments Neuromuscular Facilitation: Left;Lower Extremity;Activity to increase timing and sequencing;Forced use;Activity to increase motor control;Activity to increase sustained activation;Activity to increase grading;Activity to increase lateral weight shifting  See FIM for current functional status  Therapy/Group: Individual Therapy  Michaelene Song 07/20/2012, 3:51 PM

## 2012-07-20 NOTE — Progress Notes (Signed)
Patient ID: Glenn Maldonado, male   DOB: April 12, 1961, 51 y.o.   MRN: [id number]  Subjective/Complaints: Bad dream last noc but no other C/os Does not have PCP Review of Systems  Neurological: Positive for speech change and focal weakness. Negative for sensory change.  All other systems reviewed and are negative.    Objective: Vital Signs: Blood pressure 124/82, pulse 57, temperature 97.5 F (36.4 C), temperature source Oral, resp. rate 20, height 5\' 10"  (1.778 m), weight 77.1 kg (169 lb 15.6 oz), SpO2 95.00%. No results found. Results for orders placed during the hospital encounter of 07/01/12 (from the past 72 hour(s))  GLUCOSE, CAPILLARY     Status: Abnormal   Collection Time   07/17/12 11:18 AM      Component Value Range Comment   Glucose-Capillary 112 (*) 70 - 99 mg/dL   GLUCOSE, CAPILLARY     Status: Abnormal   Collection Time   07/17/12  4:45 PM      Component Value Range Comment   Glucose-Capillary 154 (*) 70 - 99 mg/dL    Comment 1 Notify RN     GLUCOSE, CAPILLARY     Status: Normal   Collection Time   07/17/12  9:17 PM      Component Value Range Comment   Glucose-Capillary 82  70 - 99 mg/dL    Comment 1 Notify RN     GLUCOSE, CAPILLARY     Status: Normal   Collection Time   07/18/12  7:35 AM      Component Value Range Comment   Glucose-Capillary 93  70 - 99 mg/dL    Comment 1 Notify RN     GLUCOSE, CAPILLARY     Status: Abnormal   Collection Time   07/18/12 11:33 AM      Component Value Range Comment   Glucose-Capillary 137 (*) 70 - 99 mg/dL    Comment 1 Notify RN     GLUCOSE, CAPILLARY     Status: Normal   Collection Time   07/18/12  4:26 PM      Component Value Range Comment   Glucose-Capillary 80  70 - 99 mg/dL    Comment 1 Notify RN     GLUCOSE, CAPILLARY     Status: Abnormal   Collection Time   07/18/12  8:50 PM      Component Value Range Comment   Glucose-Capillary 100 (*) 70 - 99 mg/dL    Comment 1 Notify RN     GLUCOSE, CAPILLARY     Status: Normal   Collection Time   07/19/12  7:43 AM      Component Value Range Comment   Glucose-Capillary 91  70 - 99 mg/dL    Comment 1 Notify RN     GLUCOSE, CAPILLARY     Status: Abnormal   Collection Time   07/19/12 11:47 AM      Component Value Range Comment   Glucose-Capillary 138 (*) 70 - 99 mg/dL    Comment 1 Notify RN     GLUCOSE, CAPILLARY     Status: Abnormal   Collection Time   07/19/12  4:45 PM      Component Value Range Comment   Glucose-Capillary 180 (*) 70 - 99 mg/dL    Comment 1 Notify RN     GLUCOSE, CAPILLARY     Status: Abnormal   Collection Time   07/19/12  9:39 PM      Component Value Range Comment   Glucose-Capillary 109 (*) 70 -  99 mg/dL    Comment 1 Notify RN     GLUCOSE, CAPILLARY     Status: Abnormal   Collection Time   07/20/12  7:19 AM      Component Value Range Comment   Glucose-Capillary 102 (*) 70 - 99 mg/dL    Comment 1 Notify RN      Comment 2 Documented in Chart        HEENT: poor dentition Cardio: RRR Resp: CTA B/L after clears with cough otherwise upper airway sounds GI: BS positive Extremity:  Edema mild L dorsum hand Skin:   Intact Neuro: Lethargic, Cranial Nerve Abnormalities R central 7, Abnormal Motor 1/5 L FF,2/5 L deltoid, biceps and triceps,LE 2-/5 hip knee extensor synergy, trace toes Abnormal FMC Ataxic/ dec FMC and Tone  increased extensor tone in LUE, Tone:  Hypertonia and Dysarthric Musc/Skel:  Normal   Assessment/Plan: 1. 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 Physiatrist is providing close team supervision and 24 hour management of active medical problems listed below. Physiatrist and rehab team continue to assess barriers to discharge/monitor patient progress toward functional and medical goals. FIM: FIM - Bathing Bathing Steps Patient Completed: Chest;Right Arm;Left Arm;Abdomen;Front perineal  area;Buttocks;Right upper leg;Left upper leg;Right lower leg (including foot);Left lower leg (including foot) Bathing: 4: Steadying assist  FIM - Upper Body Dressing/Undressing Upper body dressing/undressing steps patient completed: Thread/unthread right sleeve of pullover shirt/dresss;Thread/unthread left sleeve of pullover shirt/dress;Put head through opening of pull over shirt/dress;Pull shirt over trunk Upper body dressing/undressing: 5: Set-up assist to: Obtain clothing/put away FIM - Lower Body Dressing/Undressing Lower body dressing/undressing steps patient completed: Thread/unthread right underwear leg;Thread/unthread left underwear leg;Pull underwear up/down;Thread/unthread right pants leg;Thread/unthread left pants leg;Pull pants up/down;Don/Doff right sock;Don/Doff left sock;Don/Doff right shoe;Don/Doff left shoe Lower body dressing/undressing: 4: Min-Patient completed 75 plus % of tasks  FIM - Toileting Toileting steps completed by patient: Adjust clothing prior to toileting;Performs perineal hygiene Toileting Assistive Devices: Grab bar or rail for support Toileting: 3: Mod-Patient completed 2 of 3 steps  FIM - Diplomatic Services operational officer Devices: Grab bars Toilet Transfers: 4-To toilet/BSC: Min A (steadying Pt. > 75%);4-From toilet/BSC: Min A (steadying Pt. > 75%)  FIM - Banker Devices: Walker;Arm rests Bed/Chair Transfer: 5: Bed > Chair or W/C: Supervision (verbal cues/safety issues);4: Chair or W/C > Bed: Min A (steadying Pt. > 75%)  FIM - Locomotion: Wheelchair Distance: 160 feet Locomotion: Wheelchair: 5: Travels 150 ft or more: maneuvers on rugs and over door sills with supervision, cueing or coaxing FIM - Locomotion: Ambulation Locomotion: Ambulation Assistive Devices: TEFL teacher;Walker - Rolling Ambulation/Gait Assistance: 3: Mod assist Locomotion: Ambulation: 2: Travels 50 - 149 ft with moderate  assistance (Pt: 50 - 74%)  Comprehension Comprehension Mode: Auditory Comprehension: 6-Follows complex conversation/direction: With extra time/assistive device  Expression Expression Mode: Verbal Expression: 5-Expresses complex 90% of the time/cues < 10% of the time  Social Interaction Social Interaction: 6-Interacts appropriately with others with medication or extra time (anti-anxiety, antidepressant).  Problem Solving Problem Solving: 5-Solves complex 90% of the time/cues < 10% of the time  Memory Memory: 5-Recognizes or recalls 90% of the time/requires cueing < 10% of the time  Medical Problem List and Plan:  1. DVT Prophylaxis/Anticoagulation: Pharmaceutical: Lovenox  2. Pain Management: Prn ultram for headaches.  3. Mood: will need a lot of ego support as high levels of frustration noted.  Xanax prn 4. Neuropsych: This patient is capable of making decisions on his/her own behalf.  5. STEMI/Non-obstructiveCAD: Treated medically. Ace initiated for BP control.  6. Non-ischemic CM: HTN v/s CVA related.  7. Borderline DM :Hgb A1C at 6.8. Continue CBG checks AC/HS with SSI.8. HTN: Will monitor with bid checks. Cozaar not covered by $4 plan will trial Lisinopril  also need HCTZ  9. Dyslipidemia: continue lipitor. But will need pravachol at home due to $4 plan 10. Leucocytosis:  UA/UCS -, afeb monitor 11. CTS mild conservative care  LOS (Days) 19 A FACE TO FACE EVALUATION WAS PERFORMED  Ethan Clayburn E 07/20/2012, 7:42 AM

## 2012-07-20 NOTE — Progress Notes (Signed)
Occupational Therapy Session Note  Patient Details  Name: Glenn Maldonado MRN: [id number] Date of Birth: 1961/02/02  Today's Date: 07/20/2012 Time: 0830-0930 Time Calculation (min): 60 min  Short Term Goals: Week 2:  OT Short Term Goal 1 (Week 2): LTG= STG  Skilled Therapeutic Interventions/Progress Updates:  ADL re-training session completed at sink side per patient request. Patient's transfers and standing balance has improved greatly this morning. Educated patient on DME for D/C at home. Recommended grab bars in shower and by toilet, and extended tub bench. Practiced transferring into tub/shower combo with transfer bench. Informed patient that we will do family education with wife tomorrow for D/C.  Therapy Documentation Precautions:  Precautions Precautions: Fall Precaution Comments: Left side hemiparesis Restrictions Weight Bearing Restrictions: No Pain: No reports of pain. See FIM for current functional status  Therapy/Group: Individual Therapy  Limmie Patricia, OTR/L 07/20/2012, 9:09 AM

## 2012-07-20 NOTE — Plan of Care (Signed)
Problem: Food- and Nutrition-Related Knowledge Deficit (NB-1.1) Goal: Nutrition education Formal process to instruct or train a patient/client in a skill or to impart knowledge to help patients/clients voluntarily manage or modify food choices and eating behavior to maintain or improve health.  Outcome: Completed/Met Date Met:  07/20/12  RD consulted for nutrition education regarding diabetes.     Lab Results  Component Value Date    HGBA1C 6.8* 06/29/2012    RD provided "Carbohydrate Counting for People with Diabetes" handout from the Academy of [employer]. Discussed different food groups and their effects on blood sugar, emphasizing carbohydrate-containing foods. Provided list of carbohydrates and recommended serving sizes of common foods. Discussed importance of controlled and consistent carbohydrate intake throughout the day. Provided examples of ways to balance meals/snacks and encouraged intake of high-fiber, whole grain complex carbohydrates. Discussed all education needs with patient and wife. Teachback method used.  Expect fair compliance.  Body mass index is 24.39 kg/(m^2). Pt meets criteria for normal weight based on current BMI.  Current diet order is Carbohydrate Modified, patient is consuming approximately 25-100% of meals at this time. Labs and medications reviewed. No further nutrition interventions warranted at this time. RD contact information provided. If additional nutrition issues arise, please re-consult RD.  Jarold Motto MS, RD, LDN Pager: [phone] After-hours pager: [phone]

## 2012-07-20 NOTE — Progress Notes (Signed)
Physical Therapy Session Note  Patient Details  Name: Glenn Maldonado MRN: [id number] Date of Birth: [birth year]  Today's Date: 07/20/2012 Time: 1130-1200 Time Calculation (min): 30 min  Skilled Therapeutic Interventions/Progress Updates:  Tx focused on gait training and safe transfer training.  Sit<>stand with Min A x5 with cues for safe and slow technique with correct hand placement. Pt able to perform static stance x75min without UE assist and Min steadying assist while taking meds. Pt had no LOB and able to adjust with cues for equal weight bearing.   Due to L leaning compensation noted yesterday, trialed gait with R handrail only with improved performance today. Used knee cage today as well due to significant genu-recurvatum noted in stance. Pt was close to Min A only for first 2x30' trials, but level of assist increased with fatigue, needing more Mod A for stability.   Also performed gait with R HHA only as well, needing Mod A 2x40'. Pt needing cues to push into R UE rather than pull, which he was able to adjust. Pt unable to detect fatigue, wanting to continue despite obvious decline in gait quality.   Pt propelled WC 1x150' with S and assist for L foot plate.   Performed L HS stretch x60 sec.      Therapy Documentation Precautions:  Precautions Precautions: Fall Precaution Comments: Left side hemiparesis Restrictions Weight Bearing Restrictions: No General:  Pain: Pain Assessment Pain Assessment: No/denies pain   Locomotion : Ambulation Ambulation/Gait Assistance: 3: Mod assist Wheelchair Mobility Distance: 150   See FIM for current functional status  Therapy/Group: Individual Therapy  Virl Cagey, PT 07/20/2012, 12:30 PM

## 2012-07-20 NOTE — Progress Notes (Signed)
Inpatient Diabetes Program Recommendations  AACE/ADA: New Consensus Statement on Inpatient Glycemic Control (2013)  Target Ranges:  Prepandial:   less than 140 mg/dL      Peak postprandial:   less than 180 mg/dL (1-2 hours)      Critically ill patients:  140 - 180 mg/dL   Reason for Visit: [id number] diagnostic of DM.  Wife with questions  Inpatient Diabetes Program Recommendations Oral Agents: Recommend restart metformin at 500 mg bid and increase slowly by 500 mg per week until glucose controlled. (will help prevent further advancement of DM) Have ordered inpatient education and nutrtition consult for basic education. Please order OP education as well.  Insurance will pay for now that patient has diagnostic A1C. Note: Thank you, Lenor Coffin, RN, CNS, Diabetes Coordinator [phone])

## 2012-07-20 NOTE — Progress Notes (Signed)
Speech Language Pathology Daily Session Note  Patient Details  Name: Glenn Maldonado MRN: [id number] Date of Birth: 03-Sep-1961  Today's Date: 07/20/2012 Time: 7829-5621 Time Calculation (min): 30 min  Short Term Goals: Week 3: SLP Short Term Goal 1 (Week 3): Patient will monitor and self correct speech intelligibility during conversation with modified independence.  SLP Short Term Goal 2 (Week 3): Patient will solve moderately complex problems with modified independence. SLP Short Term Goal 2 - Progress (Week 3): Progressing toward goal  Skilled Therapeutic Interventions: SLP provided moderately complex problem solving and reasoning tasks related to functional ADLs including money management, mathmatical problem solving, etc. Patient able to complete with min verbal cues for increasing awareness of errors and use of compensatory aids (ie, calculator). By end of session, patient able to verbalize both intellectual awareness of deficits as well as anticipatory awareness of needs after d/c. SLP will continue to f/u.    FIM:  Comprehension Comprehension Mode: Auditory Comprehension: 6-Follows complex conversation/direction: With extra time/assistive device Expression Expression Mode: Verbal Expression: 5-Expresses complex 90% of the time/cues < 10% of the time Social Interaction Social Interaction: 6-Interacts appropriately with others with medication or extra time (anti-anxiety, antidepressant). Problem Solving Problem Solving: 5-Solves complex 90% of the time/cues < 10% of the time Memory Memory: 5-Recognizes or recalls 90% of the time/requires cueing < 10% of the time FIM - Eating Eating Activity: 5: Set-up assist for open containers  Pain Pain Assessment Pain Assessment: No/denies pain  Therapy/Group: Individual Therapy  [name] Meryl 07/20/2012, 11:29 AM

## 2012-07-21 ENCOUNTER — Inpatient Hospital Stay (HOSPITAL_COMMUNITY): Payer: Medicaid - Out of State | Admitting: Speech Pathology

## 2012-07-21 ENCOUNTER — Inpatient Hospital Stay (HOSPITAL_COMMUNITY): Payer: Self-pay

## 2012-07-21 ENCOUNTER — Inpatient Hospital Stay (HOSPITAL_COMMUNITY): Payer: Medicaid - Out of State

## 2012-07-21 LAB — GLUCOSE, CAPILLARY
Glucose-Capillary: 93 mg/dL (ref 70–99)
Glucose-Capillary: 145 mg/dL — ABNORMAL HIGH (ref 70–99)

## 2012-07-21 MED ORDER — BACLOFEN 5 MG HALF TABLET
5.0000 mg | ORAL_TABLET | ORAL | Status: AC
  Administered 2012-07-21: 5 mg via ORAL

## 2012-07-21 MED ORDER — BACLOFEN 10 MG PO TABS
10.0000 mg | ORAL_TABLET | Freq: Two times a day (BID) | ORAL | Status: DC | PRN
  Administered 2012-07-21: 5 mg via ORAL
  Administered 2012-07-22: 10 mg via ORAL
  Filled 2012-07-21 (×2): qty 1

## 2012-07-21 NOTE — Progress Notes (Signed)
Physical Therapy Session Note  Patient Details  Name: Glenn Maldonado MRN: [id number] Date of Birth: 05/28/1961  Today's Date: 07/21/2012 Time: 0805-0900 Time Calculation (min): 55 min  Short Term Goals: Week 3- LTGs  Skilled Therapeutic Interventions/Progress Updates:   Bed> w/c stand/pivot transfer to R, min guard assistance.  W/c mobility using hemi method with RUE and RLE, x 150' in controlled and home environment, supervision, including  parts mgt.  Gait with R PFRW x 50' in controlled environment, min assist for wt shift occasionally, VCs for step lengths, upright posture.  Gait UP/down curb with RW, mod assist to manage RW; min assist up/down ramp.  Wife arrived for family ed at 8:50.    Simulated car transfer to sedan height seat, min assist, VCs for L foot placement, R hand placement.  Wife assisted pt in a 2nd transfer, safely.  Therapist demonstrated how w/c is folded up, informed pt and wife that his DME would arrive this PM.  Wife will need further ed tomorrow before D/C to demonstrate safety with gait and up/down 1 step to enter home.       Therapy Documentation Precautions:  Precautions Precautions: Fall Precaution Comments: Left side hemiparesis Restrictions Weight Bearing Restrictions: No   Pain: Pain Assessment Pain Assessment: No/denies pain        See FIM for current functional status  Therapy/Group: Individual Therapy  Briya Lookabaugh 07/21/2012, 3:25 PM

## 2012-07-21 NOTE — Progress Notes (Signed)
Speech Language Pathology Daily Session Note & Discharge Summary  Patient Details  Name: Glenn Maldonado MRN: [id number] Date of Birth: February 12, 1961  Today's Date: 07/21/2012 Time: 1540-1605 Time Calculation (min): 25 min  Short Term Goals: Week 3: SLP Short Term Goal 1 (Week 3): Patient will monitor and self correct speech intelligibility during conversation with modified independence.  SLP Short Term Goal 2 (Week 3): Patient will solve moderately complex problems with modified independence. SLP Short Term Goal 2 - Progress (Week 3): Progressing toward goal  Skilled Therapeutic Interventions: Treatment session focused on adressing cognition with patient completing tasks moderately complex problems with increased wait time and occassional cues from wife (suspect baseline).  SLP also provided home exercise program to wife and patient for diaphragmatic breathing and oral motor exercises.     FIM:  Comprehension Comprehension Mode: Auditory Comprehension: 7-Follows complex conversation/direction: With no assist Expression Expression Mode: Verbal Expression: 6-Expresses complex ideas: With extra time/assistive device Social Interaction Social Interaction: 6-Interacts appropriately with others with medication or extra time (anti-anxiety, antidepressant). Problem Solving Problem Solving: 6-Solves complex problems: With extra time Memory Memory: 6-More than reasonable amt of time  Pain Pain Assessment Pain Assessment: No/denies pain  Therapy/Group: Individual Therapy   Speech Language Pathology Discharge Summary  Patient Details  Name: Glenn Maldonado MRN: [id number] Date of Birth: May 03, 1961  Today's Date: 07/21/2012  Patient has met 6 of 6 long term goals.  Patient to discharge at overall Modified Independent level.  Reasons goals not met: n/a   Clinical Impression/Discharge Summary: Patient has met 6 out of 6 long term goals with gains in swallow function; progressing from Dys.1  honey-thick liquids to regular and thin.  As well as improved arousal, attention and overall problem solving of moderately complex daily tasks that patient completed PTA.  Patient continues to exhibit trace-mild dysarthria due to left sided oral weakness which impacts speech at the conversational level intermittently.  Wife available to assist at home as needed; all education has been completed and no further skilled SLP services are warranted at this time.    Care Partner:  Caregiver Able to Provide Assistance: Yes    Recommendation:  None     Equipment: none   Reasons for discharge: Treatment goals met;Discharged from hospital   Patient/Family Agrees with Progress Made and Goals Achieved: Yes   See FIM for current functional status  Charlane Ferretti., CCC-SLP [phone]  Glenn Maldonado 07/21/2012, 4:44 PM

## 2012-07-21 NOTE — Progress Notes (Signed)
Nutrition Note  RD was consulted for diet education. Noted that this RD discussed diet education with patient and wife yesterday. Pt was seen by diabetes coordinator yesterday, however they continue to have questions regarding where to buy a glucometer, test strips, etc and affordable options.  Recommend PA/MD help accommodate these questions/concerns.  Jarold Motto MS, RD, LDN Pager: [phone] After-hours pager: [phone]

## 2012-07-21 NOTE — Progress Notes (Addendum)
Occupational Therapy Discharge Summary and Treatment Session Note  Patient Details  Name: Glenn Maldonado MRN: [id number] Date of Birth: 03-Jun-1961  Today's Date: 07/21/2012 Time: 0930-1030 Time Calculation (min): 60 min  Patient has met 13 of 13 long term goals due to improved activity tolerance, improved balance, postural control, ability to compensate for deficits, functional use of  LEFT upper extremity, improved attention and improved awareness.  Patient to discharge at [facility] Assist to supervision level.  Patient's care partner is independent to provide the necessary physical assistance at discharge.      Recommendation:  Patient will benefit from ongoing skilled OT services in outpatient setting to continue to advance functional skills in the area of BADL.  Equipment: Extended Tub bench  Reasons for discharge: Patient met maximum potential for rehab.  Patient/family agrees with progress made and goals achieved: Yes  OT Discharge Precautions/Restrictions  Precautions Precautions: Fall Pain Pain Assessment Pain Assessment: No/denies pain ADL ADL Equipment Provided: Long-handled sponge Eating: Set up Where Assessed-Eating: Wheelchair Grooming: Setup Where Assessed-Grooming: Sitting at sink Upper Body Bathing: Minimal assistance Where Assessed-Upper Body Bathing: Shower Lower Body Bathing: Minimal assistance Where Assessed-Lower Body Bathing: Shower Upper Body Dressing: Setup Where Assessed-Upper Body Dressing: Sitting at sink Lower Body Dressing: Minimal assistance Where Assessed-Lower Body Dressing: Sitting at sink Toileting: Minimal assistance Where Assessed-Toileting: Teacher, adult education: Curator Method: Surveyor, minerals: Engineer, technical sales: International aid/development worker Method: Engineer, technical sales: Transfer tub bench;Grab bars ADL Comments: Patient needed assist with  static sitting balance, vc's for technique, patient became frustrated several times with performance during ADL. Vision/Perception  Vision - History Baseline Vision:  (has glasses) Patient Visual Report: No change from baseline Vision - Assessment Eye Alignment: Within Functional Limits  Cognition Overall Cognitive Status: Appears within functional limits for tasks assessed Arousal/Alertness: Awake/alert Orientation Level: Oriented X4 Sensation Sensation Light Touch: Appears Intact Stereognosis: Impaired by gross assessment Stereognosis Impaired Details: Impaired LUE Hot/Cold: Appears Intact Proprioception: Appears Intact Coordination Gross Motor Movements are Fluid and Coordinated: No Fine Motor Movements are Fluid and Coordinated: No Coordination and Movement Description: Impared left UE fine and gross motor coordination Motor  Motor Motor: Hemiplegia Extremity/Trunk Assessment RUE Assessment RUE Assessment: Within Functional Limits (MMT: 4/5) LUE Assessment LUE Assessment: Exceptions to WFL LUE AROM (degrees) Overall AROM Left Upper Extremity: Deficits LUE PROM (degrees) Overall PROM Left Upper Extremity: Within functional limits for tasks assessed LUE Strength Left Shoulder Flexion: 3-/5 Left Shoulder Extension: 3-/5 Left Shoulder ABduction: 3-/5 Left Elbow Flexion: 3-/5 Left Elbow Extension: 3-/5 Left Forearm Pronation: 0/5 Left Forearm Supination: 0/5 Left Wrist Flexion: 0/5 Left Wrist Extension: 0/5 Gross Grasp: Impaired  See FIM for current functional status  Skilled Therapeutic Interventions/Progress Updates:   Session 1: D/C ADL performed this AM in tub/shower combo with wife present for family education. Educated wife and patient regarding proper transfer techniques and safety during bathing and dressing. Recommended ordering extended tub bench for use at home. Wife and patient completed family education successfully.  Session 2: NM re-ed session  completed with powder board; LUE; sidelying on right side on mat table; shoulder flexion/extension; elbow flexion/extension; 1 set; 5 reps; mod-max assist with extension. Isometric exercises while seated EOM; LUE; 3x5; shoulder flexion; shoulder abduction. PROM; LUE; supination/pronatin; wrist flexion/extension; digits Patient presenting with increased grip strength this session.  Limmie Patricia, OTR/L 07/21/2012, 1:23 PM

## 2012-07-21 NOTE — Progress Notes (Signed)
Social Work Patient ID: Glenn Maldonado, male   DOB: 04/25/1961, 51 y.o.   MRN: [id number] Met with pt and wife to confirm DME and follow up.  [facility] for wheelchair, rolling walker, and tub bench. Pt requires a lightweight wheelchair to be able to self propel and uses for self care.  He is unable to self propel in a standard wheelchair. Referral made to [facility] wife willing to take him there for follow up therapies.  Both pleased with his progress and comfortable with  discharge tomorrow.

## 2012-07-21 NOTE — Progress Notes (Signed)
Patient ID: Glenn Maldonado, male   DOB: Jun 20, 1961, 51 y.o.   MRN: [id number]  Subjective/Complaints: Bad dream last noc but no other C/os Does not have PCP Review of Systems  Neurological: Positive for speech change and focal weakness. Negative for sensory change.  All other systems reviewed and are negative.   Objective: Vital Signs: Blood pressure 129/70, pulse 72, temperature 97.6 F (36.4 C), temperature source Oral, resp. rate 17, height 5\' 10"  (1.778 m), weight 77.1 kg (169 lb 15.6 oz), SpO2 95.00%. No results found. Results for orders placed during the hospital encounter of 07/01/12 (from the past 72 hour(s))  GLUCOSE, CAPILLARY     Status: Abnormal   Collection Time   07/18/12 11:33 AM      Component Value Range Comment   Glucose-Capillary 137 (*) 70 - 99 mg/dL    Comment 1 Notify RN     GLUCOSE, CAPILLARY     Status: Normal   Collection Time   07/18/12  4:26 PM      Component Value Range Comment   Glucose-Capillary 80  70 - 99 mg/dL    Comment 1 Notify RN     GLUCOSE, CAPILLARY     Status: Abnormal   Collection Time   07/18/12  8:50 PM      Component Value Range Comment   Glucose-Capillary 100 (*) 70 - 99 mg/dL    Comment 1 Notify RN     GLUCOSE, CAPILLARY     Status: Normal   Collection Time   07/19/12  7:43 AM      Component Value Range Comment   Glucose-Capillary 91  70 - 99 mg/dL    Comment 1 Notify RN     GLUCOSE, CAPILLARY     Status: Abnormal   Collection Time   07/19/12 11:47 AM      Component Value Range Comment   Glucose-Capillary 138 (*) 70 - 99 mg/dL    Comment 1 Notify RN     GLUCOSE, CAPILLARY     Status: Abnormal   Collection Time   07/19/12  4:45 PM      Component Value Range Comment   Glucose-Capillary 180 (*) 70 - 99 mg/dL    Comment 1 Notify RN     GLUCOSE, CAPILLARY     Status: Abnormal   Collection Time   07/19/12  9:39 PM      Component Value Range Comment   Glucose-Capillary 109 (*) 70 - 99 mg/dL    Comment 1 Notify RN     GLUCOSE,  CAPILLARY     Status: Abnormal   Collection Time   07/20/12  7:19 AM      Component Value Range Comment   Glucose-Capillary 102 (*) 70 - 99 mg/dL    Comment 1 Notify RN      Comment 2 Documented in Chart     GLUCOSE, CAPILLARY     Status: Abnormal   Collection Time   07/20/12 11:02 AM      Component Value Range Comment   Glucose-Capillary 162 (*) 70 - 99 mg/dL    Comment 1 Notify RN      Comment 2 Documented in Chart     GLUCOSE, CAPILLARY     Status: Abnormal   Collection Time   07/20/12  5:07 PM      Component Value Range Comment   Glucose-Capillary 146 (*) 70 - 99 mg/dL   GLUCOSE, CAPILLARY     Status: Normal   Collection Time  07/20/12  9:29 PM      Component Value Range Comment   Glucose-Capillary 88  70 - 99 mg/dL    Comment 1 Documented in Chart      Comment 2 Notify RN        HEENT: poor dentition Cardio: RRR Resp: CTA B/L after clears with cough otherwise upper airway sounds GI: BS positive Extremity:  Edema mild L dorsum hand Skin:   Intact Neuro: Lethargic, Cranial Nerve Abnormalities R central 7, Abnormal Motor 1/5 L FF,2/5 L deltoid, biceps and triceps,LE 2-/5 hip knee extensor synergy, trace toes Abnormal FMC Ataxic/ dec FMC and Tone  increased extensor tone in LUE, Tone:  Hypertonia and Dysarthric Musc/Skel:  Normal   Assessment/Plan: 1. 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 should be stable for D/C in amFIM: FIM - Bathing Bathing Steps Patient Completed: Chest;Right Arm;Left Arm;Abdomen;Front perineal area;Buttocks;Right upper leg;Left upper leg;Right lower leg (including foot);Left lower leg (including foot) Bathing: 4: Steadying assist  FIM - Upper Body Dressing/Undressing Upper body dressing/undressing steps patient completed: Thread/unthread right sleeve of pullover shirt/dresss;Thread/unthread left sleeve of pullover shirt/dress;Put head through opening of pull over shirt/dress;Pull shirt over trunk Upper body  dressing/undressing: 5: Set-up assist to: Obtain clothing/put away FIM - Lower Body Dressing/Undressing Lower body dressing/undressing steps patient completed: Thread/unthread right underwear leg;Thread/unthread left underwear leg;Pull underwear up/down;Thread/unthread right pants leg;Thread/unthread left pants leg;Pull pants up/down;Don/Doff right sock;Don/Doff left sock;Don/Doff right shoe;Don/Doff left shoe Lower body dressing/undressing: 4: Steadying Assist  FIM - Toileting Toileting steps completed by patient: Adjust clothing prior to toileting;Performs perineal hygiene Toileting Assistive Devices: Grab bar or rail for support Toileting: 3: Mod-Patient completed 2 of 3 steps  FIM - Diplomatic Services operational officer Devices: Grab bars Toilet Transfers: 4-To toilet/BSC: Min A (steadying Pt. > 75%)  FIM - Bed/Chair Transfer Bed/Chair Transfer Assistive Devices: Arm rests Bed/Chair Transfer: 4: Bed > Chair or W/C: Min A (steadying Pt. > 75%)  FIM - Locomotion: Wheelchair Distance: 150 Locomotion: Wheelchair: 6: Travels 150 ft or more, turns around, maneuvers to table, bed or toilet, negotiates 3% grade: maneuvers on rugs and over door sills independently FIM - Locomotion: Ambulation Locomotion: Ambulation Assistive Devices: Other (comment) (Handrail) Ambulation/Gait Assistance: 3: Mod assist Locomotion: Ambulation: 1: Travels less than 50 ft with moderate assistance (Pt: 50 - 74%)  Comprehension Comprehension Mode: Auditory Comprehension: 6-Follows complex conversation/direction: With extra time/assistive device  Expression Expression Mode: Verbal Expression: 6-Expresses complex ideas: With extra time/assistive device  Social Interaction Social Interaction: 6-Interacts appropriately with others with medication or extra time (anti-anxiety, antidepressant).  Problem Solving Problem Solving: 5-Solves complex 90% of the time/cues < 10% of the time  Memory Memory:  5-Recognizes or recalls 90% of the time/requires cueing < 10% of the time  Medical Problem List and Plan:  1. DVT Prophylaxis/Anticoagulation: Pharmaceutical: Lovenox  2. Pain Management: Prn ultram for headaches.  3. Mood: will need a lot of ego support as high levels of frustration noted. Xanax prn 4. Neuropsych: This patient is capable of making decisions on his/her own behalf.  5. STEMI/Non-obstructiveCAD: Treated medically. Ace initiated for BP control.  6. Non-ischemic CM: HTN v/s CVA related.  7. Borderline DM :Hgb A1C at 6.8. Continue CBG checks AC/HS with SSI.8. HTN: Will monitor with bid checks. Cozaar not covered by $4 plan will trial Lisinopril  also need HCTZ  9. Dyslipidemia: continue lipitor. But will need pravachol at home due to $4 plan 10. Leucocytosis:  UA/UCS -, afeb monitor 11. CTS  mild conservative care  LOS (Days) 20 A FACE TO FACE EVALUATION WAS PERFORMED  Junia Nygren E 07/21/2012, 7:49 AM

## 2012-07-22 ENCOUNTER — Inpatient Hospital Stay (HOSPITAL_COMMUNITY): Payer: Medicaid - Out of State | Admitting: Physical Therapy

## 2012-07-22 LAB — GLUCOSE, CAPILLARY

## 2012-07-22 MED ORDER — LISINOPRIL 20 MG PO TABS: 20.0000 mg | ORAL_TABLET | Freq: Two times a day (BID) | ORAL | Status: DC

## 2012-07-22 MED ORDER — ASPIRIN 325 MG PO TBEC: 325.0000 mg | DELAYED_RELEASE_TABLET | Freq: Every day | ORAL | Status: AC

## 2012-07-22 MED ORDER — TRAMADOL HCL 50 MG PO TABS: 50.0000 mg | ORAL_TABLET | Freq: Four times a day (QID) | ORAL | Status: DC

## 2012-07-22 MED ORDER — BACLOFEN 10 MG PO TABS: 5.0000 mg | ORAL_TABLET | Freq: Three times a day (TID) | ORAL | Status: DC

## 2012-07-22 MED ORDER — PRAVASTATIN SODIUM 40 MG PO TABS: 80.0000 mg | ORAL_TABLET | Freq: Every evening | ORAL | Status: AC

## 2012-07-22 MED ORDER — HYDROCHLOROTHIAZIDE 12.5 MG PO CAPS: 12.5000 mg | ORAL_CAPSULE | Freq: Every day | ORAL | Status: AC

## 2012-07-22 NOTE — Progress Notes (Signed)
Physical Therapy Note  Patient Details  Name: Callan Norden MRN: [id number] Date of Birth: [birth year] Today's Date: 07/22/2012  0855 -0940 (45 minutes) individual Pain : no complaint of pain Focus of treatment: Caregiver education- up/down curb (1 step), ramp using PFRW with wife Treatment: Wife instructed in and redemonstrated up/down 1 step (curb) and up/down ramp with PFRW min assist for balance and to assist AD up /down step. Adjusted height of RW and platform. During sit to supine experienced nausea/vominting ("upset stomach") . Nursing notified/meds given. Returned to room.   Joan Herschberger,JIM 07/22/2012, 7:52 AM

## 2012-07-22 NOTE — Progress Notes (Signed)
Social Work Patient ID: Glenn Maldonado, male   DOB: 19-Jun-1961, 51 y.o.   MRN: [id number] Have given pt and wife information regarding free medical clinic that services Glenn Maldonado and [facility] Wife to pursue, they will also assist with meds.

## 2012-07-22 NOTE — Progress Notes (Signed)
Recreational Therapy Discharge Summary Patient Details  Name: Eugen Jeansonne MRN: [id number] Date of Birth: [birth year] Today's Date: 07/22/2012  Long term goals set: 1  Long term goals met: 1  Comments on progress toward goals: Pt has made excellent progress toward goals and is ready for discharge home today with wife to provide/coordinate 24 hour supervision.  Pt and wife educated on importance of remaining active and involved in leisure interests and it's effect on overall health and wellness.  Discussed adaptations, energy conservation techniques and community reintegration with pt and wife.  Reasons for discharge: discharge from hospital  Patient/family agrees with progress made and goals achieved: Yes  Tzippy Testerman 07/22/2012, 4:44 PM

## 2012-07-22 NOTE — Progress Notes (Signed)
Patient ID: Glenn Maldonado, male   DOB: 11-Aug-1961, 51 y.o.   MRN: [id number]  Subjective/Complaints:  Does not have PCP Review of Systems  Neurological: Positive for speech change and focal weakness. Negative for sensory change.  All other systems reviewed and are negative.   Objective: Vital Signs: Blood pressure 128/75, pulse 65, temperature 98.3 F (36.8 C), temperature source Oral, resp. rate 17, height 5\' 10"  (1.778 m), weight 80 kg (176 lb 5.9 oz), SpO2 96.00%. No results found. Results for orders placed during the hospital encounter of 07/01/12 (from the past 72 hour(s))  GLUCOSE, CAPILLARY     Status: Normal   Collection Time   07/19/12  7:43 AM      Component Value Range Comment   Glucose-Capillary 91  70 - 99 mg/dL    Comment 1 Notify RN     GLUCOSE, CAPILLARY     Status: Abnormal   Collection Time   07/19/12 11:47 AM      Component Value Range Comment   Glucose-Capillary 138 (*) 70 - 99 mg/dL    Comment 1 Notify RN     GLUCOSE, CAPILLARY     Status: Abnormal   Collection Time   07/19/12  4:45 PM      Component Value Range Comment   Glucose-Capillary 180 (*) 70 - 99 mg/dL    Comment 1 Notify RN     GLUCOSE, CAPILLARY     Status: Abnormal   Collection Time   07/19/12  9:39 PM      Component Value Range Comment   Glucose-Capillary 109 (*) 70 - 99 mg/dL    Comment 1 Notify RN     GLUCOSE, CAPILLARY     Status: Abnormal   Collection Time   07/20/12  7:19 AM      Component Value Range Comment   Glucose-Capillary 102 (*) 70 - 99 mg/dL    Comment 1 Notify RN      Comment 2 Documented in Chart     GLUCOSE, CAPILLARY     Status: Abnormal   Collection Time   07/20/12 11:02 AM      Component Value Range Comment   Glucose-Capillary 162 (*) 70 - 99 mg/dL    Comment 1 Notify RN      Comment 2 Documented in Chart     GLUCOSE, CAPILLARY     Status: Abnormal   Collection Time   07/20/12  5:07 PM      Component Value Range Comment   Glucose-Capillary 146 (*) 70 - 99 mg/dL     GLUCOSE, CAPILLARY     Status: Normal   Collection Time   07/20/12  9:29 PM      Component Value Range Comment   Glucose-Capillary 88  70 - 99 mg/dL    Comment 1 Documented in Chart      Comment 2 Notify RN     GLUCOSE, CAPILLARY     Status: Normal   Collection Time   07/21/12  7:49 AM      Component Value Range Comment   Glucose-Capillary 93  70 - 99 mg/dL    Comment 1 Notify RN     GLUCOSE, CAPILLARY     Status: Abnormal   Collection Time   07/21/12 11:13 AM      Component Value Range Comment   Glucose-Capillary 145 (*) 70 - 99 mg/dL    Comment 1 Notify RN     GLUCOSE, CAPILLARY     Status: Normal  Collection Time   07/21/12  4:25 PM      Component Value Range Comment   Glucose-Capillary 99  70 - 99 mg/dL    Comment 1 Notify RN     GLUCOSE, CAPILLARY     Status: Normal   Collection Time   07/21/12  9:05 PM      Component Value Range Comment   Glucose-Capillary 86  70 - 99 mg/dL      HEENT: poor dentition Cardio: RRR Resp: CTA B/L after clears with cough otherwise upper airway sounds GI: BS positive Extremity:  Edema mild L dorsum hand Skin:   Intact Neuro: Lethargic, Cranial Nerve Abnormalities R central 7, Abnormal Motor 1/5 L FF,2/5 L deltoid, biceps and triceps,LE 2-/5 hip knee extensor synergy, trace toes Abnormal FMC Ataxic/ dec FMC and Tone  increased extensor tone in LUE, Tone:  Hypertonia and Dysarthric Musc/Skel:  Normal   Assessment/Plan: 1. 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 should be stable for D/C FIM: FIM - Bathing Bathing Steps Patient Completed: Chest;Right Arm;Left Arm;Abdomen;Front perineal area;Buttocks;Right upper leg;Left upper leg;Right lower leg (including foot);Left lower leg (including foot) Bathing: 4: Steadying assist  FIM - Upper Body Dressing/Undressing Upper body dressing/undressing steps patient completed: Thread/unthread right sleeve of pullover shirt/dresss;Thread/unthread left sleeve of pullover  shirt/dress;Put head through opening of pull over shirt/dress;Pull shirt over trunk Upper body dressing/undressing: 5: Set-up assist to: Obtain clothing/put away FIM - Lower Body Dressing/Undressing Lower body dressing/undressing steps patient completed: Thread/unthread right underwear leg;Thread/unthread left underwear leg;Pull underwear up/down;Thread/unthread right pants leg;Thread/unthread left pants leg;Pull pants up/down;Don/Doff right sock;Don/Doff left sock;Don/Doff right shoe;Don/Doff left shoe Lower body dressing/undressing: 4: Steadying Assist  FIM - Toileting Toileting steps completed by patient: Adjust clothing prior to toileting;Performs perineal hygiene Toileting Assistive Devices: Grab bar or rail for support Toileting: 4: Steadying assist  FIM - Diplomatic Services operational officer Devices: Grab bars Toilet Transfers: 4-To toilet/BSC: Min A (steadying Pt. > 75%);4-From toilet/BSC: Min A (steadying Pt. > 75%)  FIM - Bed/Chair Transfer Bed/Chair Transfer Assistive Devices: Arm rests Bed/Chair Transfer: 4: Bed > Chair or W/C: Min A (steadying Pt. > 75%);4: Chair or W/C > Bed: Min A (steadying Pt. > 75%)  FIM - Locomotion: Wheelchair Distance: 150 Locomotion: Wheelchair: 5: Travels 150 ft or more: maneuvers on rugs and over door sills with supervision, cueing or coaxing FIM - Locomotion: Ambulation Locomotion: Ambulation Assistive Devices: TEFL teacher;Walker - Rolling Ambulation/Gait Assistance: 4: Min assist Locomotion: Ambulation: 2: Travels 50 - 149 ft with minimal assistance (Pt.>75%)  Comprehension Comprehension Mode: Auditory Comprehension: 7-Follows complex conversation/direction: With no assist  Expression Expression Mode: Verbal Expression: 7-Expresses complex ideas: With no assist  Social Interaction Social Interaction: 6-Interacts appropriately with others with medication or extra time (anti-anxiety, antidepressant).  Problem Solving Problem  Solving: 6-Solves complex problems: With extra time  Memory Memory: 6-More than reasonable amt of time  Medical Problem List and Plan:  1. DVT Prophylaxis/Anticoagulation: Pharmaceutical: Lovenox  2. Pain Management: Prn ultram for headaches.  3. Mood: will need a lot of ego support as high levels of frustration noted. Xanax prn 4. Neuropsych: This patient is capable of making decisions on his/her own behalf.  5. STEMI/Non-obstructiveCAD: Treated medically. Ace initiated for BP control.  6. Non-ischemic CM: HTN v/s CVA related.  7. Borderline DM :Hgb A1C at 6.8. Continue CBG checks AC/HS with SSI                                                      .  8. HTN: Will monitor with bid checks. Lisinopril   9. Dyslipidemia: continue lipitor. But will need pravachol at home due to $4 plan 10. Leucocytosis:  UA/UCS -, afeb monitor 11. CTS mild conservative care  LOS (Days) 21 A FACE TO FACE EVALUATION WAS PERFORMED  KIRSTEINS,ANDREW E 07/22/2012, 6:57 AM

## 2012-07-22 NOTE — Progress Notes (Signed)
Social Work Discharge Note Discharge Note  The overall goal for the admission was met for:   Discharge location: Yes-HOME WITH WIFE WHO WILL PROVIDE 24 HOUR CARE  Length of Stay: Yes-21 DAYS  Discharge activity level: Yes-SUPERVISION/MIN LEVEL  Home/community participation: Yes  Services provided included: MD, RD, PT, OT, SLP, RN, TR, Pharmacy, Neuropsych and SW  Financial Services: Other: PENDING MEDICAID  Follow-up services arranged: Outpatient: [facility]-PT, OT 9/24 10;00-12:00, DME: [facility]-WHEELCHAIR, ROLLING WALKER WITH PLATFORM, TUB BENCH and Patient/Family has no preference for HH/DME agencies  Comments (or additional information):MEDICAID/SSD PENDING WIFE TO FOLLOW UP WITH-ALSO TO OBTAIN PCP  Patient/Family verbalized understanding of follow-up arrangements: Yes  Individual responsible for coordination of the follow-up plan: DONNA-WIFE  Confirmed correct DME delivered: Lucy Chris 07/22/2012    Lucy Chris

## 2012-07-22 NOTE — Progress Notes (Signed)
Pt. Discharged from rehab at 1225 to home with family upon discharge.  All discharge instructions provided by Marissa Nestle, PA.  No further questions asked.  Escorted to vehicle by nursing staff members.

## 2012-07-23 NOTE — Progress Notes (Signed)
Physical Therapy Discharge Summary  Patient Details  Name: Glenn Maldonado MRN: [id number] Date of Birth: 16-May-1961  Today's Date: 07/23/2012    Patient has met 8 of 8 long term goals due to improved activity tolerance, improved balance, improved postural control, increased strength, ability to compensate for deficits and functional use of  left upper extremity and left lower extremity.  Patient to discharge at an ambulatory level Min Assist.   Patient's care partner is independent to provide the necessary physical assistance at discharge.  Reasons goals not met: n/a  Recommendation:  Patient will benefit from ongoing skilled PT services in outpatient setting to continue to advance safe functional mobility, address ongoing impairments in strength, motor sequencing and timing, balance, activity tolerance, and minimize fall risk.  Equipment: RW with L forearm support, rental w/c with [employer] back and cushion  Reasons for discharge: treatment goals met and discharge from hospital  Patient/family agrees with progress made and goals achieved: Yes  PT Discharge Precautions/Restrictions- falls; frequent nausea and vomiting     Pain- none reported      Cognition Orientation Level: Oriented X4 Sensation Sensation Light Touch: Appears Intact Proprioception: Appears Intact Motor  Motor Motor: Hemiplegia Motor - Discharge Observations: movement at hip and knee in/out of synergy  Mobility Bed Mobility Bed Mobility: Rolling Right;Rolling Left;Left Sidelying to Sit Rolling Right: 6: Modified independent (Device/Increase time) Rolling Left: 6: Modified independent (Device/Increase time) Left Sidelying to Sit: 5: Supervision Left Sidelying to Sit Details: Verbal cues for technique (1 cue) Sit to Supine: 6: Modified independent (Device/Increase time) Transfers Sit to Stand: 4: Min guard Sit to Stand Details: Verbal cues for technique Stand to Sit: 4: Min guard Stand Pivot Transfers:  4: Min Actuary Details: Visual cues/gestures for sequencing;Verbal cues for technique Locomotion  Ambulation Ambulation: Yes Ambulation/Gait Assistance: 4: Min assist Assistive device: Rolling walker;Left platform walker Ambulation/Gait Assistance Details: Tactile cues for initiation;Verbal cues for gait pattern;Tactile cues for weight shifting Gait Gait: Yes Gait Pattern: Decreased step length - right;Decreased weight shift to left;Trunk rotated posteriorly on left (when fatigued, L hamstring clonus elicited ) Gait velocity: decreased Stairs / Additional Locomotion Ramp: 4: Min assist Curb: 3: Mod Paediatric nurse: Yes Wheelchair Assistance: 5: Investment banker, operational: Right upper extremity;Right lower extremity Wheelchair Parts Management: Supervision/cueing  Trunk/Postural Assessment  Cervical Assessment Cervical Assessment: Within Film/video editor Assessment: Within Functional Limits Lumbar Assessment Lumbar Assessment: Within Chartered loss adjuster Sitting Balance Static Sitting - Balance Support: Feet supported;Right upper extremity supported Static Sitting - Level of Assistance: 5: Stand by assistance Extremity Assessment      RLE Assessment RLE Assessment: Within Functional Limits LLE Assessment LLE Assessment: Exceptions to [facility] LLE Strength LLE Overall Strength Comments: hip flexion 3/5; knee extension 4/5; knee flexion 4-/5; ankle DF 1/5  See FIM for current functional status  [place] Monds 07/23/2012, 6:03 PM

## 2012-07-27 ENCOUNTER — Telehealth (HOSPITAL_COMMUNITY): Payer: Self-pay | Admitting: Dietician

## 2012-07-27 NOTE — Telephone Encounter (Signed)
Received referral via fax on 07/23/12 from Paulla Fore, Inpatient Diabetes Coordinator at [facility], for outpatient diabetes education.

## 2012-07-28 ENCOUNTER — Ambulatory Visit (HOSPITAL_COMMUNITY): Payer: Self-pay | Admitting: Physical Therapy

## 2012-07-28 ENCOUNTER — Ambulatory Visit (HOSPITAL_COMMUNITY)
Admission: RE | Admit: 2012-07-28 | Discharge: 2012-07-28 | Disposition: A | Discharge status: Home / Self Care | Payer: Medicaid - Out of State | Source: Ambulatory Visit | Attending: Physical Medicine & Rehabilitation | Admitting: Physical Medicine & Rehabilitation

## 2012-07-28 DIAGNOSIS — R209 Unspecified disturbances of skin sensation: Secondary | ICD-10-CM | POA: Insufficient documentation

## 2012-07-28 DIAGNOSIS — IMO0001 Reserved for inherently not codable concepts without codable children: Secondary | ICD-10-CM | POA: Insufficient documentation

## 2012-07-28 DIAGNOSIS — I639 Cerebral infarction, unspecified: Secondary | ICD-10-CM | POA: Diagnosis present

## 2012-07-28 DIAGNOSIS — R279 Unspecified lack of coordination: Secondary | ICD-10-CM | POA: Insufficient documentation

## 2012-07-28 DIAGNOSIS — R29898 Other symptoms and signs involving the musculoskeletal system: Secondary | ICD-10-CM | POA: Diagnosis present

## 2012-07-28 DIAGNOSIS — R269 Unspecified abnormalities of gait and mobility: Secondary | ICD-10-CM | POA: Insufficient documentation

## 2012-07-28 DIAGNOSIS — I10 Essential (primary) hypertension: Secondary | ICD-10-CM | POA: Insufficient documentation

## 2012-07-28 DIAGNOSIS — I69998 Other sequelae following unspecified cerebrovascular disease: Secondary | ICD-10-CM | POA: Insufficient documentation

## 2012-07-29 DIAGNOSIS — R262 Difficulty in walking, not elsewhere classified: Secondary | ICD-10-CM | POA: Insufficient documentation

## 2012-07-29 NOTE — Evaluation (Signed)
Physical Therapy Evaluation  Patient Details  Name: Glenn Maldonado MRN: [id number] Date of Birth: 1961-04-19  Today's Date: 07/29/2012 Time: 1025-1110 PT Time Calculation (min): 45 min Charges: 1 eval Visit#: 1  of 12   Re-eval: 08/28/12 Assessment Diagnosis: L sided weakness s/p R CVA Surgical Date: 06/28/12 Next MD Visit: Dr. Patrina Levering - 10/17 Prior Therapy: Inpatient rehab at [facility]  Authorization: [place] Medicaid Pending  Authorization Time Period:    Authorization Visit#:   of     Past Medical History:  Past Medical History  Diagnosis Date  . Hypertension   . Borderline diabetic   . STEMI (ST elevation myocardial infarction) 06/28/2012  . HTN (hypertension), uncontrolled 06/28/2012  . Leg weakness, bilateral 06/28/2012  . Tobacco abuse 06/28/2012  . Weight loss, non-intentional 06/28/2012  . OSA (obstructive sleep apnea), history of, but with recent weight loss less symptoms 06/28/2012  . Hepatitis C 2001    treated with interferon  . NICM (nonischemic cardiomyopathy), EF 35% at cardiac cath 06/28/2012   Past Surgical History: No past surgical history on file.  Subjective Symptoms/Limitations Pertinent History: Pt is referred to PT secondary to L sided weakness s/p R CVA which occured on August 25th.  He initall presented at [facility] hospital and was sent to [facility] for cardiac catheratization.  During this time his wife states he had multiple strokes.  He was placed in inpatient therapy at [facility] for 3 weeks and was then d/c home.  At this time his [place] is pending.  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  How long can you walk comfortably?: w/RW Pain Assessment Currently in Pain?: No/denies  Precautions/Restrictions  Precautions Precautions: Fall Precaution Comments: Left side hemiparesis Restrictions Weight Bearing  Restrictions: No  Prior Functioning  Home Living Lives With: Spouse Available Help at Discharge: Family Type of Home: House Home Access: Level entry Home Layout: Two level Alternate Level Stairs-Number of Steps: 12 (into basement) Bathroom Shower/Tub: Tub/shower unit;Curtain Firefighter: Standard Bathroom Accessibility: Yes How Accessible: Accessible via walker Home Adaptive Equipment: Hand-held shower hose Additional Comments: wife's father had RW, w/c, canes Prior Function Level of Independence: Independent with gait Able to Take Stairs?: Yes Driving: Yes Vocation: Full time employment Vocation Requirements: worked in Holiday representative  Comments: He enjoys being outside, walking and working with his hands.   Cognition/Observation Cognition Behaviors: Impulsive Comments: pt is impulsive with gait and reports that he feels he can do more, however has LOB episodes w/o fall and requires manual assistance to keep from falling.  Observation/Other Assessments Observations: aggitated this morning due to eval starting 10 minutes later than scheduled, but becomes plesant during eval.  Sensation/Coordination/Flexibility/Functional Tests Coordination Gross Motor Movements are Fluid and Coordinated: No Coordination and Movement Description: Impaired L LE coordinatin and movement, increased genu recravatum during gait. Functional Tests Functional Tests: 6 min walk 496 ft. w/mod A and quad cane (gait speed 1.38 feet/sec) Functional Tests: 30 sec STS: 1x complete  Assessment LLE Strength LLE Overall Strength Comments: hip flexion 3/5; knee extension 4/5; knee flexion 4-/5; ankle DF 1/5  Mobility/Balance  Transfers Sit to Stand: 4: Min guard Stand to Sit: 4: Min guard Ambulation/Gait Ambulation/Gait Assistance: 3: Mod assist Ambulation/Gait Assistance Details: manual assistance to decrease fall risk with quad cane Assistive device: Left platform walker;Large base quad cane Gait  Pattern: Decreased step length -  right;Decreased weight shift to left;Trunk rotated posteriorly on left;Left genu recurvatum Gait velocity: decreased Wheelchair Mobility Wheelchair Assistance: 6: Modified independent (Device/Increase time) Timed Up and Go Test TUG: Normal TUG Normal TUG (seconds): 28  (quad cane mod A)   Physical Therapy Assessment and Plan PT Assessment and Plan Clinical Impression Statement: Pt is a 52 year old male referred to PT for L sided weakness following a R CVA on 06/28/12.  After treatment today, pt was notably fatigued and required mod A using L platform walker and had a LOB episode with full recovery from manual assitance and was placed in his transfer chair.   Pt will benefit from skilled therapeutic intervention in order to improve on the following deficits: Abnormal gait;Decreased activity tolerance;Decreased balance;Difficulty walking;Decreased strength;Decreased mobility;Decreased safety awareness;Decreased knowledge of use of DME (decreased safety awareness) Rehab Potential: Good PT Frequency: Min 3X/week PT Duration: 8 weeks PT Treatment/Interventions: DME instruction;Gait training;Stair training;Functional mobility training;Therapeutic activities;Therapeutic exercise;Balance training;Neuromuscular re-education;Patient/family education PT Plan: Gait training w/L platform walker and quad cane.  BWS TM to improve gait mechanics and decrease genu recravatum.  Continue with safety awareness and teach appropriate techniques for sitting and standing to decrease risk of falls.     Goals Home Exercise Program Pt will Perform Home Exercise Program: Independently PT Goal: Perform Home Exercise Program - Progress: Goal set today PT Short Term Goals Time to Complete Short Term Goals: 4 weeks PT Short Term Goal 1: Pt will ambulate with S w/quad cane PT Short Term Goal 2: Pt will demonstrate Tandem stance x10 sec on static surface.  PT Short Term Goal 3: Pt will  complete Berg Balance test and DGI.  PT Short Term Goal 4: Pt will improve TUG to less than 13 sec w/S and LRAD for safe community ambulation.  PT Long Term Goals Time to Complete Long Term Goals: 8 weeks PT Long Term Goal 1: Pt will improve his LE strength to WNL in order to tolerate ambulating x15 minutes w/LRAD in order to return to community ambulation. PT Long Term Goal 2: Pt will improve his dynamic balance and demonstrate ambulating on grass surfaces w/LRAD and S in order to return to outdoor activities.  Long Term Goal 3: Pt will score a 50/56 on Berg and 18/24 on DGI for safe community ambulation.  Long Term Goal 4: Pt will improve gait speed to 3 feet/sec for normaltive values for age and ambulate 360 ft in 2 minutes.   Problem List Patient Active Problem List  Diagnosis  . 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 HTN (hypertension), uncontrolled  . Leg weakness, bilateral  . Tobacco abuse  . Weight loss, non-intentional  . OSA (obstructive sleep apnea), history of, but with recent weight loss less symptoms  . NICM, EF 35% at cardiac cath, 55% by echo  . CVA (cerebral infarction)  . Difficulty in walking   PT - End of Session Activity Tolerance: Patient limited by fatigue PT Plan of Care PT Patient Instructions: Discussed medicaid reimbursment.  Discussed using L platform walker at home with A from wife and decrease use of transfer chair to improve LE strength.  Consulted and Agree with Plan of Care: Patient;Family member/caregiver Family Member Consulted: wife  Annett Fabian, PT 07/29/2012, 1:43 PM  Physician Documentation Your signature is required to indicate approval of the treatment plan as stated above.  Please sign and either send electronically or make a copy of this report  for your files and return this physician signed original.   Please mark one 1.__approve of plan  2. ___approve of plan with the following  conditions.   ______________________________                                                          _____________________ Physician Signature                                                                                                             Date

## 2012-07-31 NOTE — Discharge Summary (Signed)
NAME:  Glenn Maldonado, Glenn Maldonado NO.:  [id number]  MEDICAL RECORD NO.:  [id number]  LOCATION:  4031                         FACILITY:  [facility]  PHYSICIAN:  Erick Colace, M.D.DATE OF BIRTH:  [birth year]  DATE OF ADMISSION:  07/01/2012 DATE OF DISCHARGE:  07/22/2012                              DISCHARGE SUMMARY   DISCHARGE DIAGNOSES: 1. Right internal capsule thrombotic infarct. 2. NSTEMI 3. 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 4. Hypertension. 5. Impaired fasting glucose/borderline diabetes.  HISTORY OF PRESENT ILLNESS:  [name] is a 51 year old right- handed male with history of hypertension, hep C, who was admitted on June 28, 2012, with complaints of chest pressure and difficulty walking with left arm heaviness and tingling sensation.  BP was noted to be elevated at 178/118 and EKG showed ST changes.  The patient was started on IV heparin.  CT of head then showed no acute changes.  He underwent cardiac cath revealing global hypokinesis with EF of 40-45% and nonobstructive coronary artery disease.  The patient had hypotension with BP drop to 97/46 despite fluid bolus.  He required dopamine for stabilization.  He developed facial droop with dysarthria and worsening of left hemiparesis.  Neurology was consulted for input and felt the patient likely with thrombotic infarct.  Workup was initiated.  Carotid Dopplers done, showed 40-59% left distal ICA stenosis.  MRI of brain showed acute infarcts, posterior limb of right internal capsule and parietal white matter bilaterally.  Neuro recommends aspirin for CVA prophylaxis.  The patient was evaluated by Therapy Team and CIR was recommended for progression.  PAST MEDICAL HISTORY:  The patient was independent prior to admission. He is self-employed and active.  FUNCTIONAL STATUS:  The patient was mod assist for bed mobility, total assist 40% for sit to stand.  Total  assist 20% for stand pivot transfers.  He required max assist for upper body dressing, total assist for lower body dressing, total assist for toileting transfers.  He was noted to have impairments in cognition with poor attention, decreased awareness, as well as impaired problem solving.  HOSPITAL COURSE:  [name] was admitted to rehab on July 01, 2012, for inpatient therapies to consist of PT, OT, and speech therapy at least 3 hours 5 days a week.  Past admission, physiatrist, rehab, RN, and Therapy Team have worked together to provide customized collaborative interdisciplinary care.  Rehab RN has worked with the patient on bowel and bladder program as well as safety plan.  The patient had left hemiparesis with emerging tone on left lower extremity  as well as left inattention and weakness of CN VII.  He was also had some problems with anxiety and agitation at admission that had improved greatly by the time of discharge.  The patient's blood pressures were monitored on b.i.d. basis.  These were noted to be well controlled by the time of discharge.  Blood sugars were monitored on a.c. and at bedtime basis.  His blood sugars were noted to be controlled on diet alone.  Blood sugars at the time of discharge ranging from 90s to 140s range.  He was advised to continue checking  blood sugars on b.i.d. to t.i.d. basis past discharge and follow up with primary MD regarding input on initiation of oral meds.   Due to dysphagia, he was placed on thickened liquids and required IV fluids for hydration until p.o. intake improved.  Once diet was advanced to regular textures thin liquids, his hydration status was noted to improve.  Check of labs last of July 08, 2012, revealed sodium 143, potassium 4.2, chloride 105, CO2 of 31, BUN 23, creatinine 1.06, glucose 119.  CBC of July 08, 2012, revealed hemoglobin 14.9, hematocrit 44.3, white count 12.0, platelets 257.  Chest x-ray was done  on July 03, 2012, due to some complaints of shortness of breath and question of aspiration. This showed no acute cardiopulmonary disease.  By the time of discharge, the patient was showing increased extensor tone in left upper extremity with abnormal motor in lower extremities.  He was noted to have 2/5 left hip extensor synergy with trace in toes.  He was started on baclofen for problems with muscle spasms.  This was titrated to 5-10 mg b.i.d. and 10 mg at bedtime.  Headaches were treated with Ultram and this had resolved by the time of discharge.  During the patient's stay in rehab, weekly team conferences were held to monitor the patient's progress, set goals, as well as discuss barriers to discharge.  Physical Therapy has worked with the patient on balance, postural control, strengthening, as well as activity tolerance.  At the time of discharge, the patient was modified independent for bed mobility.  Min guard assist for transfers.  Min assist for ambulating 50 feet with platform rolling walker.  He was able to navigate 1 step and go up and down a ramp with min assist for balance and to assist his walker up and down the stairs.  Speech Therapy has worked with the patient on speech intelligibility as well as cognitive deficits.  The treatment has focused on addressing cognition having the patient complete moderately complex tasks with increased wait time and occasional cues from wife.  He was also educated on diaphragmatic breathing and oral motor exercises.  He was able to express complex ideas with extra time at an assistive device.  He was interacting appropriately with extra time and was able to complete complex problems with extra time.  He continued to exhibit trace-to-mild dysarthria due to left oral weakness.  No further speech therapy is indicated at the time of discharge.  OT has worked with the patient on self-care tasks as well as neuro re-education of left upper  extremity.  By the time of discharge, the patient was showing improvement in ability to compensate for deficits as well as functional use of left upper extremity.  He was showing improvement in attention and awareness.  He was overall at min assist for upper and lower body bathing.  He requires setup assist for upper body dressing, min assist for lower body dressing, min assist for tub shower transfers.  Family education has been done with wife.  They will continue to provide assistance as needed past discharge.  On July 22, 2012, the patient is discharged to home.  DISCHARGE MEDICATIONS: 1. Tylenol 325-650 mg p.o. q.4 h. p.r.n. pain. 2. Aspirin 325 mg p.o. per day. 3. Baclofen 10 mg half to one tab p.o. with breakfast and at 4:00 p.m.     and whole pill at bedtime. 4. Hydrochlorothiazide 12.5 mg a day. 5. Lisinopril 20 mg p.o. per day. 6. Pravastatin 80 mg p.o.  q.p.m. 7. Ultram 50 mg p.o. q.6 h.  DIET:  Diabetic diet.  ACTIVITY LEVEL:  As tolerated with 24-hour supervision assistance.  SPECIAL INSTRUCTIONS:  No alcohol.  Outpatient PT, OT, begin at [facility] on Tuesday July 28, 2012, at 10:00 a.m.  Follow up with [facility] in [place] in the next week. Follow up with Dr. Bryan Lemma for recheck in 2 weeks.  Follow up with Dr. Delia Heady in 4 weeks.  Follow up with Dr. Claudette Laws on August 20, 2012 at 10:00 a.m.     Delle Reining, P.A.   ______________________________ Erick Colace, M.D.    PL/MEDQ  D:  07/30/2012  T:  07/31/2012  Job:  [id number]  cc:   Landry Corporal, MD Pramod P. Pearlean Brownie, MD

## 2012-08-03 ENCOUNTER — Ambulatory Visit (HOSPITAL_COMMUNITY)
Admission: RE | Admit: 2012-08-03 | Discharge: 2012-08-03 | Disposition: A | Discharge status: Home / Self Care | Payer: Medicaid - Out of State | Source: Ambulatory Visit | Attending: Physical Medicine & Rehabilitation | Admitting: Physical Medicine & Rehabilitation

## 2012-08-03 NOTE — Progress Notes (Signed)
Physical Therapy Treatment Patient Details  Name: Tully Mcinturff MRN: [id number] Date of Birth: 11-08-1960  Today's Date: 08/03/2012 Time: 7829-5621 PT Time Calculation (min): 37 min Charges:  therex 18', gait 16' Visit#: 2  of 12   Re-eval: 08/28/12 Authorization: [place] Medicaid Pending    Subjective: Symptoms/Limitations Symptoms: Pt. had to go to restroom when initially arrived (1308).  Treatment started late due to this.  Pt reports no pain, however states he is already worn out from getting here. Pain Assessment Currently in Pain?: No/denies   Exercise/Treatments Mobility/Balance  Transfers Stand to Sit: 6: Modified independent (Device/Increase time) Ambulation/Gait Ambulation/Gait: Yes Ambulation/Gait Assistance: 4: Min assist;3: Mod assist Ambulation/Gait Assistance Details: Ambulation without AD 124'X2 with min-mod assist; cues for posture, looking forward, advancing L LE fully without hyperextension. Ambulation Distance (Feet): 250 Feet Assistive device: None Gait Pattern: Decreased stance time - left;Decreased dorsiflexion - left;Trunk rotated posteriorly on left Stairs: No   Standing Other Standing Knee Exercises: side stepping 1 RT Other Standing Knee Exercises: retro gait 1RT Seated Long Arc Quad: 2 sets;5 reps;Weights Long Arc Quad Weight: 3 lbs. Other Seated Knee Exercises: sit to stand 10 reps no UE Other Seated Knee Exercises: hip flexion 2 sets 5 reps no weight     Physical Therapy Assessment and Plan PT Assessment and Plan Clinical Impression Statement: Pt. doing well and compliant with HEP with wife.  Wife reports he was able to walk in/out of church without AD and has been walking in his home without AD.  Pt. required 4 seated rest breaks during treatment today.  Added retro and side stepping to POC with min asssist and VC's for not looking down during mobility.   PT Plan: Continue with safety awareness and teach appropriate techniques for sitting  and standing to decrease risk of falls. Progress balance activities, add exercises for quad and DF strengthening, prone for LBP and quadruped activies for WB and core stability.     Problem List Patient Active Problem List  Diagnosis  . 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 HTN (hypertension), uncontrolled  . Leg weakness, bilateral  . Tobacco abuse  . Weight loss, non-intentional  . OSA (obstructive sleep apnea), history of, but with recent weight loss less symptoms  . NICM, EF 35% at cardiac cath, 55% by echo  . CVA (cerebral infarction)  . Difficulty in walking    PT - End of Session Equipment Utilized During Treatment: Gait belt Activity Tolerance: Patient tolerated treatment well General Behavior During Session: [facility] for tasks performed Cognition: [facility] for tasks performed   Lurena Nida, PTA/CLT 08/03/2012, 2:40 PM

## 2012-08-03 NOTE — Evaluation (Signed)
Occupational Therapy Evaluation  Patient Details  Name: Glenn Maldonado MRN: [id number] Date of Birth: 1961-07-31  Today's Date: 08/03/2012 Time: 1345-1430 OT Time Calculation (min): 45 min OT eval 45'  Visit#: 1  of 24   Re-eval: 08/31/12  Assessment Diagnosis: R CVA Prior Therapy: Inpatient rehan at [facility]    Past Medical History:  Past Medical History  Diagnosis Date  . Hypertension   . Borderline diabetic   . STEMI (ST elevation myocardial infarction) 06/28/2012  . HTN (hypertension), uncontrolled 06/28/2012  . Leg weakness, bilateral 06/28/2012  . Tobacco abuse 06/28/2012  . Weight loss, non-intentional 06/28/2012  . OSA (obstructive sleep apnea), history of, but with recent weight loss less symptoms 06/28/2012  . Hepatitis C 2001    treated with interferon  . NICM (nonischemic cardiomyopathy), EF 35% at cardiac cath 06/28/2012   Past Surgical History: No past surgical history on file.  Subjective Symptoms/Limitations Symptoms: S: It's getting me down that I can't move my left arm like I want to. Pertinent History: Glenn Maldonado is referred to Outpatient OT secondary to L sided weakness s/p R CVA which occured on June 28, 2012.  He initally presented at [facility] and was sent to [facility] for cardiac catheratization.  During this time his wife states he had multiple strokes.  He was placed in inpatient therapy at [facility] for 3 weeks and was then d/c home.  At this time his [place] is pending.  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   Limitations: Moving his left arm functionally. Patient Stated Goals: To move his left arm. Pain Assessment Currently in Pain?: No/denies  Precautions/Restrictions  Precautions Precautions: Fall Precaution Comments: Left side hemiparesis Restrictions Weight Bearing Restrictions: No  Prior  Functioning  Home Living Lives With: Spouse Available Help at Discharge: Family Type of Home: House Home Access: Level entry Home Layout: Two level Alternate Level Stairs-Number of Steps: 12 Bathroom Shower/Tub: Tub/shower unit;Curtain Firefighter: Standard Bathroom Accessibility: Yes How Accessible: Accessible via walker Home Adaptive Equipment: Hand-held shower hose;Long-handled sponge;Tub transfer bench;Walker - rolling;Wheelchair - manual Prior Function Level of Independence: Needs assistance with ADLs;Needs assistance with tranfers;Needs assistance with homemaking;Needs assistance with gait Vocation Requirements: worked in Holiday representative  Comments: He enjoys being outside, walking and working with his hands.  Assessment ADL/Vision/Perception ADL ADL Comments:  (Wife provides Supervision/Min Assist for BADL.) Dominant Hand: Right  Cognition/Observation Cognition Overall Cognitive Status: Appears within functional limits for tasks assessed Arousal/Alertness: Awake/alert Orientation Level: Oriented X4  Sensation/Coordination/Edema Coordination Gross Motor Movements are Fluid and Coordinated: No (absent L hand.) Fine Motor Movements are Fluid and Coordinated: No (absent Left hand) 9 Hole Peg Test:  (unable to perform.)  Additional Assessments RUE AROM (degrees) Right Shoulder Flexion: 130 Degrees (seated) Right Shoulder External Rotation: 76 Degrees (seated; adducted) Right Elbow Flexion: 110  (seated) RUE Strength Right Shoulder Flexion: 5/5 Right Shoulder Horizontal ABduction: 5/5 Right Shoulder Horizontal ADduction: 5/5 Right Elbow Flexion: 5/5 Right Elbow Extension: 5/5 Grip (lbs): 50 LUE AROM (degrees) Left Shoulder Flexion: 105 Degrees (seated) Left Shoulder External Rotation: 71 Degrees (seated; adducted) Left Elbow Flexion: 50  (seated) LUE Strength Left Shoulder Flexion: 2+/5 Left Shoulder Horizontal ABduction: 2+/5 Left Shoulder Horizontal  ADduction: 3/5 Left Elbow Flexion: 3-/5 Left Elbow Extension: 3/5 Left Wrist Flexion: 0/5 Left Wrist Extension: 0/5 Gross Grasp: Impaired Grip (lbs): 4  Occupational Therapy Assessment and Plan OT Assessment and Plan Clinical Impression Statement: A:  51 year old with recent CVA with deficits effecting his non-dominant side. Patient will benefit from OT to increase LUE A/ROM, ADL performance, LUE GMC, LUE FMC, LUE strength and endurance. Pt will benefit from skilled therapeutic intervention in order to improve on the following deficits: Decreased activity tolerance;Decreased coordination;Decreased balance;Decreased range of motion;Decreased strength;Decreased safety awareness;Impaired UE functional use;Impaired sensation Rehab Potential: Good OT Frequency: Min 3X/week OT Duration: 8 weeks OT Treatment/Interventions: Self-care/ADL training;Therapeutic exercise;Neuromuscular education;Energy conservation;Therapeutic activities;Manual therapy;DME and/or AE instruction;Balance training;Patient/family education OT Plan: P: Skilled OT intervention to increase LUE function to non-dominant in order to return to PLOF.  Treatment Plan:  GMC training, [facility] training, LUE and hand strengthening.  Proximal shoulder strengthening.  MFR to left shoulder as needed for muscle spasms/stiffnesss.     Goals Short Term Goals Time to Complete Short Term Goals: 4 weeks Short Term Goal 1: Patient will be educated on HEP. Short Term Goal 2: Patient will increase LUE GMC from absent to poor for increased I with BADLs. Short Term Goal 3: Patient will increase use of LUE as non-dominant with functional activities by 25%. Short Term Goal 4: Patient will increase left grip strength by 5 pounds for increased ability to hold containers. Short Term Goal 5: Patient will increase LUE AROM by 5 degrees for increase independence with BADL. Long Term Goals Time to Complete Long Term Goals: 8 weeks Long Term Goal 1:  Patient will increase dynamic standing balance to Good for increased independence with BADL. Long Term Goal 2: Patient will increase LUE strength to 3+/5. Long Term Goal 3: Patient will increase LUE GMC from poor to fair. Long Term Goal 4: Patient will increase use of LUE as non-dominant with functional activities by 50%.  Problem List Patient Active Problem List  Diagnosis  . 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 HTN (hypertension), uncontrolled  . Leg weakness, bilateral  . Tobacco abuse  . Weight loss, non-intentional  . OSA (obstructive sleep apnea), history of, but with recent weight loss less symptoms  . NICM, EF 35% at cardiac cath, 55% by echo  . CVA (cerebral infarction)  . Difficulty in walking    End of Session Activity Tolerance: Patient tolerated treatment well General Behavior During Session: [facility] for tasks performed Cognition: [facility] for tasks performed OT Plan of Care OT Home Exercise Plan: Theraputty (yellow) HEP, towel slides, weightbearing exercises. Consulted and Agree with Plan of Care: Patient;Family member/caregiver Family Member Consulted: Wife, Pollyann Glen, OTR/L 08/03/2012, 3:11 PM  Physician Documentation Your signature is required to indicate approval of the treatment plan as stated above.  Please sign and either send electronically or make a copy of this report for your files and return this physician signed original.  Please mark one 1.__approve of plan  2. ___approve of plan with the following conditions.   ______________________________                                                          _____________________ Physician Signature  Date  

## 2012-08-03 NOTE — Telephone Encounter (Signed)
Sent letter to pt home via US Mail in attempt to contact pt to schedule appointment.  

## 2012-08-05 ENCOUNTER — Ambulatory Visit (HOSPITAL_COMMUNITY)
Admission: RE | Admit: 2012-08-05 | Discharge: 2012-08-05 | Disposition: A | Discharge status: Home / Self Care | Payer: Medicaid - Out of State | Source: Ambulatory Visit | Attending: Physical Medicine & Rehabilitation | Admitting: Physical Medicine & Rehabilitation

## 2012-08-05 DIAGNOSIS — R269 Unspecified abnormalities of gait and mobility: Secondary | ICD-10-CM | POA: Insufficient documentation

## 2012-08-05 DIAGNOSIS — R29898 Other symptoms and signs involving the musculoskeletal system: Secondary | ICD-10-CM | POA: Insufficient documentation

## 2012-08-05 DIAGNOSIS — R279 Unspecified lack of coordination: Secondary | ICD-10-CM | POA: Insufficient documentation

## 2012-08-05 DIAGNOSIS — I69998 Other sequelae following unspecified cerebrovascular disease: Secondary | ICD-10-CM | POA: Insufficient documentation

## 2012-08-05 DIAGNOSIS — I10 Essential (primary) hypertension: Secondary | ICD-10-CM | POA: Insufficient documentation

## 2012-08-05 DIAGNOSIS — R209 Unspecified disturbances of skin sensation: Secondary | ICD-10-CM | POA: Insufficient documentation

## 2012-08-05 DIAGNOSIS — IMO0001 Reserved for inherently not codable concepts without codable children: Secondary | ICD-10-CM | POA: Insufficient documentation

## 2012-08-05 NOTE — Progress Notes (Signed)
Occupational Therapy Treatment Patient Details  Name: Glenn Maldonado MRN: [id number] Date of Birth: October 09, 1961  Today's Date: 08/05/2012 Time: 4098-1191 OT Time Calculation (min): 40 min  Visit#: 2  of 24   Re-eval: 08/31/12 Assessment Diagnosis: R CVA    Subjective Symptoms/Limitations Symptoms: S:  I just want this arm to move. Pain Assessment Currently in Pain?: No/denies  Precautions/Restrictions     Exercise/Treatments Neurological Re-education Exercises Shoulder Flexion: Supine;AAROM;10 reps Shoulder ABduction: Supine;AAROM;10 reps Shoulder Protraction: Supine;AAROM;10 reps Shoulder Horizontal ABduction: Supine;AAROM;10 reps Shoulder External Rotation: Supine;AAROM;10 reps Shoulder Internal Rotation: Supine;AAROM;10 reps Elbow Flexion: Supine;10 reps;AAROM Elbow Extension: Supine;AAROM;10 reps Forearm Supination: Supine;AAROM;10 reps Forearm Pronation: Supine;AAROM;10 reps Wrist Flexion: Supine;AAROM;10 reps  Weight Bearing Exercises Weight Bearing Position: Seated Seated with weight on hand: x15  Development of Reach  Development of Reach: Saebo Saebo Crate; Right: facilitating elbow ext and pronation and grasp and release  Grasp and Release Grasp and Release:  (with Saebo balls)            Occupational Therapy Assessment and Plan OT Assessment and Plan Clinical Impression Statement: A:  Multiple ex to facilitate AROM/AAROM.  Patient with good grasp which made him very surprised and happy. Patient with good movement when gravity eliminated.  Educated patient to strengthen shoulder blade to facilitate good arm movement. OT Plan: P:  Add place and hold and proximal shoulder strengthening   Goals Short Term Goals Time to Complete Short Term Goals: 4 weeks Short Term Goal 1: Patient will be educated on HEP. Short Term Goal 2: Patient will increase LUE GMC from absent to poor for increased I with BADLs. Short Term Goal 3: Patient will increase use of LUE  as non-dominant with functional activities by 25%. Short Term Goal 4: Patient will increase left grip strength by 5 pounds for increased ability to hold containers. Short Term Goal 5: Patient will increase LUE AROM by 5 degrees for increase independence with BADL. Long Term Goals Time to Complete Long Term Goals: 8 weeks Long Term Goal 1: Patient will increase dynamic standing balance to Good for increased independence with BADL. Long Term Goal 2: Patient will increase LUE strength to 3+/5. Long Term Goal 3: Patient will increase LUE GMC from poor to fair. Long Term Goal 4: Patient will increase use of LUE as non-dominant with functional activities by 50%.  Problem List Patient Active Problem List  Diagnosis  . 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 HTN (hypertension), uncontrolled  . Leg weakness, bilateral  . Tobacco abuse  . Weight loss, non-intentional  . OSA (obstructive sleep apnea), history of, but with recent weight loss less symptoms  . NICM, EF 35% at cardiac cath, 55% by echo  . CVA (cerebral infarction)  . Difficulty in walking    End of Session Activity Tolerance: Patient tolerated treatment well General Behavior During Session: [facility] for tasks performed Cognition: [facility] for tasks performed OT Plan of Care OT Home Exercise Plan: Ed on positions to facilitate AROM with gravity eliminated to get a good ROM  GO   Isaiah Cianci L. Noralee Stain, COTA/L  08/05/2012, 6:34 PM

## 2012-08-05 NOTE — Progress Notes (Signed)
Physical Therapy Treatment Patient Details  Name: Glenn Maldonado MRN: [id number] Date of Birth: [birth year]  Today's Date: 08/05/2012 Time: 1191-4782 PT Time Calculation (min): 40 min Charges:  NMR 15', therex 12',  gait 10' Visit#: 3  of 12   Re-eval: 08/28/12 Authorization: [place] Medicaid Pending    Subjective: Symptoms/Limitations Symptoms: 5'late for appt; Pt. states he is really tired today; reports soreness in hip flexors, thighs and hips after last session.   Pain Assessment Currently in Pain?: No/denies   Exercise/Treatments Standing Gait Training: 100' without AD X 1 Seated Long Arc Quad: 2 sets;5 reps;Weights Long Arc Quad Weight: 3 lbs. Other Seated Knee Exercises: sit to stand 10 reps no UE Other Seated Knee Exercises: hip flexion and DF 2 sets 5 reps no weight Prone  Other Prone Exercises: Quadruped WB on Left, crawl fwd/bkwd 1X Other Prone Exercises: tall kneel crawl fwd/bwd 1RT      Physical Therapy Assessment and Plan PT Assessment and Plan Clinical Impression Statement: Able to begin quadruped activities with assistance to advance L UE/support for WB but overall able to complete task well with minimal difficulty.  Pt. fatigues quickly and requires frequent rest breaks.  Noted improvement in LE strength with exercises today.   PT Plan: Continue with safety awareness and teach appropriate techniques for sitting and standing to decrease risk of falls. Progress balance activities, add exercises for quad and DF strengthening, prone for LBP and quadruped activies for WB and core stability.      Problem List Patient Active Problem List  Diagnosis  . 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 HTN (hypertension), uncontrolled  . Leg weakness, bilateral  . Tobacco abuse  . Weight loss, non-intentional  . OSA (obstructive sleep apnea), history of, but with recent weight loss less symptoms  . NICM, EF 35% at cardiac cath, 55%  by echo  . CVA (cerebral infarction)  . Difficulty in walking    PT - End of Session Equipment Utilized During Treatment: Gait belt Activity Tolerance: Patient tolerated treatment well;Patient limited by fatigue General Behavior During Session: [facility] for tasks performed Cognition: [facility] for tasks performed   Lurena Nida, PTA/CLT 08/05/2012, 2:57 PM

## 2012-08-07 ENCOUNTER — Ambulatory Visit (HOSPITAL_COMMUNITY)
Admission: RE | Admit: 2012-08-07 | Discharge: 2012-08-07 | Disposition: A | Discharge status: Home / Self Care | Payer: Medicaid - Out of State | Source: Ambulatory Visit | Attending: Physical Therapy | Admitting: Physical Therapy

## 2012-08-07 NOTE — Telephone Encounter (Signed)
Sent letter to pt home via US Mail in attempt to contact pt to schedule appointment.  

## 2012-08-07 NOTE — Progress Notes (Signed)
Physical Therapy Treatment Patient Details  Name: Glenn Maldonado MRN: [id number] Date of Birth: 09-25-1961  Today's Date: 08/07/2012 Time: 2841-3244 PT Time Calculation (min): 43 min Charges: 27' NMR  Visit#: 4  of 12   Re-eval: 08/28/12   Authorization: [place] Medicaid Pending    Subjective: Symptoms/Limitations Symptoms: Pt reports that he is very tired today.   Pain Assessment Currently in Pain?: Yes Pain Score:   3 Pain Location: Back  Exercise/Treatments Standing Gait Training: w/o AD x1 A w/VC and TC for pelvic rotation and to discuss balance x100 feet Other Standing Knee Exercises: Standing balance with dynamic UE use w/cueing to hips and low back to activate core Seated Long Arc Quad: Left;15 reps Long Arc Quad Weight: 3 lbs. Other Seated Knee Exercises: Tall Kneeling x20 w/mod A and cueing for posture Other Seated Knee Exercises: STS x5 w/LUE use; Toe Raises x20  Supine Other Supine Knee Exercises: Quad position with forward and back weight shifting and TC for pelvic rotation Other Supine Knee Exercises: QUAD: LUE SAR x10 w/TC for LT and MT activation  Splinting Splinting: Strapping to LUE to decrease scapular winging w/Endura tape  Physical Therapy Assessment and Plan PT Assessment and Plan Clinical Impression Statement: Treatment concentrated on NMR to improve pelvic rotation, core strength and scapular rythm.  pt had signficant improvement in Tibialis Anterior control with increased reps of toe raises. Pt had improved scapular rythm after taping today.  Continues to require mod A w/ambulation secondary to weakness of LE and stumbles throughout ambulation.  PT Plan: Continue with safety awareness and teach appropriate techniques for sitting and standing to decrease risk of falls. Progress balance activities, add exercises for quad and DF strengthening, prone for LBP and quadruped activies for WB and core stability.     Goals    Problem List Patient Active  Problem List  Diagnosis  . 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 HTN (hypertension), uncontrolled  . Leg weakness, bilateral  . Tobacco abuse  . Weight loss, non-intentional  . OSA (obstructive sleep apnea), history of, but with recent weight loss less symptoms  . NICM, EF 35% at cardiac cath, 55% by echo  . CVA (cerebral infarction)  . Difficulty in walking    PT - End of Session Equipment Utilized During Treatment: Gait belt Activity Tolerance: Patient tolerated treatment well;Patient limited by fatigue General Behavior During Session: [facility] for tasks performed Cognition: [facility] for tasks performed  Olivianna Higley, PT 08/07/2012, 3:43 PM

## 2012-08-07 NOTE — Progress Notes (Signed)
Occupational Therapy Treatment Patient Details  Name: Glenn Maldonado MRN: [id number] Date of Birth: 02-Oct-1961  Today's Date: 08/07/2012 Time: 1445-1550 OT Time Calculation (min): 65 min Co Treat with PT from 245-315 15' charge for co-tx OT treat. [phone] 26'  Visit#: 3  of 24   Re-eval: 08/31/12    Authorization:   N/A Authorization Time Period:    Authorization Visit#:   of    Subjective Symptoms/Limitations Symptoms: S: Im frustrated that Im telling my fingers to move and they dont.  Pain Assessment Currently in Pain?: Yes Pain Score:   3 Pain Location: Back  Precautions/Restrictions     Exercise/Treatments Neurological Re-education Exercises Sponges:  (Max assist tip to tip to place sponge in bin x4)  Weight Bearing Exercises Weight Bearing Position: Quadraped (and on elbow) Seated with weight on hand:  (x3 w/c pushups) Quadraped with weight on forearm:  (x10 in quadraped to pick up cones; bilateral reach)  Development of Reach  Development of Reach: Reaching Reaching to Shoulder Height:  (x10 standing, bilateral reach and grasp soccer ball; )  Grasp and Release Grasp and Release:  (E stem used to grasp and release sponge and place in bin. )  Fine Motor Coordination       Modalities Modalities: Archivist Stimulation Location: forearm Electrical Stimulation Action: NMES Electrical Stimulation Parameters: 10/20 with 2" ramp turned up to patient's comfort Electrical Stimulation Goals: Neuromuscular facilitation Weight Bearing Technique Weight Bearing Technique: Yes LUE Weight Bearing Technique: Quadruped Response to Weight Bearing Technique: fatigues quickly Splinting Splinting: Strapping to LUE to decrease scapular winging w/Endura tape  Occupational Therapy Assessment and Plan OT Assessment and Plan Clinical Impression Statement: A:Co trreated with PT for 30 mins this session. Pt able to hold soccer ball  with both hand while standing and pass from one therapist on the right and left side. In quadriped position pt able to reach out with strong arm and weight bear on affected forearm to reach for cones to place on either side of body. Pt required frequent breaks due to pain and fatigue. Pt required max assist to grasp and release 1 sponge at a time to place into bin. Pt showed minimal movement in thumb. E Stem was used for neuro re ed skills; pt reponded well by grasping and releasing sponge into bin. Rehab Potential: Good OT Plan: P: Add place and hold and proximal shoulder strengthening.    Goals Short Term Goals Time to Complete Short Term Goals: 4 weeks Short Term Goal 1: Patient will be educated on HEP. Short Term Goal 2: Patient will increase LUE GMC from absent to poor for increased I with BADLs. Short Term Goal 3: Patient will increase use of LUE as non-dominant with functional activities by 25%. Short Term Goal 4: Patient will increase left grip strength by 5 pounds for increased ability to hold containers. Short Term Goal 5: Patient will increase LUE AROM by 5 degrees for increase independence with BADL. Long Term Goals Time to Complete Long Term Goals: 8 weeks Long Term Goal 1: Patient will increase dynamic standing balance to Good for increased independence with BADL. Long Term Goal 2: Patient will increase LUE strength to 3+/5. Long Term Goal 3: Patient will increase LUE GMC from poor to fair. Long Term Goal 4: Patient will increase use of LUE as non-dominant with functional activities by 50%.  Problem List Patient Active Problem List  Diagnosis  . STE on ECG - not MI; non-obstructive CAD on Cath,  most likely related to HTN  . HTN (hypertension), uncontrolled  . Leg weakness, bilateral  . Tobacco abuse  . Weight loss, non-intentional  . OSA (obstructive sleep apnea), history of, but with recent weight loss less symptoms  . NICM, EF 35% at cardiac cath, 55% by echo  . CVA  (cerebral infarction)  . Difficulty in walking    End of Session Activity Tolerance: Patient tolerated treatment well General Behavior During Session: [facility] for tasks performed Cognition: [facility] for tasks performed  GO    Noralee Stain, Roann Merk L 08/07/2012, 4:35 PM

## 2012-08-10 ENCOUNTER — Ambulatory Visit (HOSPITAL_COMMUNITY)
Admission: RE | Admit: 2012-08-10 | Discharge: 2012-08-10 | Disposition: A | Discharge status: Home / Self Care | Payer: Medicaid - Out of State | Source: Ambulatory Visit | Attending: Physical Medicine & Rehabilitation | Admitting: Physical Medicine & Rehabilitation

## 2012-08-10 NOTE — Progress Notes (Signed)
Physical Therapy Treatment Patient Details  Name: Glenn Maldonado MRN: [id number] Date of Birth: 1961/07/31  Today's Date: 08/10/2012 Time: 4098-1191 PT Time Calculation (min): 41 min Visit#: 5  of 12   Re-eval: 08/28/12 Charges:  therex 10', NMR 15', Gait 14' Authorization: [place] Medicaid Pending    Subjective: Symptoms/Limitations Symptoms: Pt. states he is not hurting anywhere; a little soreness in his back.  States he mowed his yard with his riding lawnmower X2 hours. Pain Assessment Currently in Pain?: No/denies   Exercise/Treatments Standing SLS: R:  8", L" 10" 10 trials each Gait Training: w/o AD x1 A w/VC and TC for pelvic rotation and to discuss balance x100 feet Other Standing Knee Exercises: toe tapping with 4" step 10 reps Other Standing Knee Exercises: retro gait, side step, balance beam 2RT Seated Long Arc Quad: Left;10 reps Long Arc Quad Weight: 4 lbs. Other Seated Knee Exercises: Hip flexion 4# 10 reps Other Seated Knee Exercises: STS x10 without UE use; Toe Raises 1x10 with knee at 90 degrees    Physical Therapy Assessment and Plan PT Assessment and Plan Clinical Impression Statement: Progressed balance activities today to include tandem with balance beam. Pt required mod assist to complete task. Required VC's to decrease R hip substitution with toe tapping with 4" step. Improve SLS time (L actually better than R ) and overall balance with activities PT Plan: Continue with safety awareness and teach appropriate techniques for sitting and standing to decrease risk of falls. Progress balance activities, add exercises for quad and DF strengthening, prone for LBP and quadruped activies for WB and core stability.       Problem List Patient Active Problem List  Diagnosis  . 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 HTN (hypertension), uncontrolled  . Leg weakness, bilateral  . Tobacco abuse  . Weight loss, non-intentional   . OSA (obstructive sleep apnea), history of, but with recent weight loss less symptoms  . NICM, EF 35% at cardiac cath, 55% by echo  . CVA (cerebral infarction)  . Difficulty in walking    PT - End of Session Equipment Utilized During Treatment: Gait belt Activity Tolerance: Patient tolerated treatment well;Patient limited by fatigue General Behavior During Session: [facility] for tasks performed Cognition: [facility] for tasks performed    Lurena Nida, PTA/CLT 08/10/2012, 2:00 PM

## 2012-08-10 NOTE — Progress Notes (Signed)
Occupational Therapy Treatment Patient Details  Name: Tyee Vandevoorde MRN: [id number] Date of Birth: [birth year]  Today's Date: 08/10/2012 Time: 8295-6213 OT Time Calculation (min): 45 min Massage: [id number] 10' NM re-ed: 9629-5284 35' Visit#: 4  of 24   Re-eval: 08/31/12    Authorization: Medicaid pending  Authorization Time Period:    Authorization Visit#:   of    Subjective Symptoms/Limitations Symptoms: S: I was able to open the car door with my left hand today! Pain Assessment Currently in Pain?: No/denies  Precautions/Restrictions  Fall; L hemi Exercise/Treatments  ROM / Strengthening / Isometric Strengthening   Flexion: Supine;3X5"; shoulder  Elbow Exercises Elbow Flexion:  (isometric; 3x5) Elbow Flexion Limitations:  (place and hold; 3x5) Elbow Extension: Supine;10 reps;Left;AAROM;AROM (isometric; 3x5) Elbow Extension Limitations:  (place and hold; 3x5) Forearm Supination: Supine;AAROM;10 reps Forearm Pronation: Supine;AAROM;10 reps   Sponges:  (HOH assist to pick up/place 18 sponges)  Wrist Exercises Forearm Supination: Supine;AAROM;10 reps Forearm Pronation: Supine;AAROM;10 reps     Hand Exercises Opposition: Supine;10 reps;Left;PROM Sponges:  (HOH assist to pick up/place 18 sponges)  Neurological Re-education Exercises Shoulder Flexion Limitations:  (place and hold; 3x5) Elbow Flexion:  (isometric; 3x5) Elbow Flexion Limitations:  (place and hold; 3x5) Elbow Extension: Supine;10 reps;Left;AAROM;AROM (isometric; 3x5) Elbow Extension Limitations:  (place and hold; 3x5) Forearm Supination: Supine;AAROM;10 reps Forearm Pronation: Supine;AAROM;10 reps  Weight Bearing Exercises Weight Bearing Position: Morey Hummingbird with weight on hand:  (quadreped to pick/place cones; bilateral reach)   Fine Motor Coordination Opposition: Supine;10 reps;Left;PROM     Manual Therapy Manual Therapy: Massage Massage: Massage and gentle traction performed to  left shoulder deltoid and scapular region to help decrease pain level and increase mobility.  Weight Bearing Technique Weight Bearing Technique: Yes LUE Weight Bearing Technique: Quadruped Response to Weight Bearing Technique: pain in left shoulder when weightbearing  Occupational Therapy Assessment and Plan OT Assessment and Plan Clinical Impression Statement: A: Pt stated that he has been trying to use his left arm for daily tasks as much as possible. Wife reports that yesterday patient was very depressed and asked if there was a therapist he could talk to. Pt and wife encourarged to attend Stroke support group in [place] and/or look in the phone book for counselors. Pt able to tolerate place and hold exercises added to treatment. Overall, pt is steadily making progress. Still experiencing intermittent left shoulder pain with activities. Rehab Potential: Good OT Plan: P: Cont. place and hold and proximal shoulder strengtening exercises.   Goals Short Term Goals Short Term Goal 1: Patient will be educated on HEP. Short Term Goal 1 Progress: Met Short Term Goal 2: Patient will increase LUE GMC from absent to poor for increased I with BADLs.  Short Term Goal 2 Progress: Progressing toward goal Short Term Goal 3: Patient will increase use of LUE as non-dominant with functional activities by 25%.  Short Term Goal 3 Progress: Progressing toward goal Short Term Goal 4: Patient will increase left grip strength by 5 pounds for increased ability to hold containers. Short Term Goal 4 Progress: Progressing toward goal Short Term Goal 5: Patient will increase LUE AROM by 5 degrees for increase independence with BADL.  Short Term Goal 5 Progress: Progressing toward goal Long Term Goals Long Term Goal 1: Patient will increase dynamic standing balance to Good for increased independence with BADL. Long Term Goal 1 Progress: Progressing toward goal Long Term Goal 2: Patient will increase LUE strength to 3+/5.   Long Term Goal 2 Progress: Progressing  toward goal Long Term Goal 3: Patient will increase LUE GMC from poor to fair. Long Term Goal 3 Progress: Progressing toward goal Long Term Goal 4: Patient will increase use of LUE as non-dominant with functional activities by 50%. Long Term Goal 4 Progress: Progressing toward goal  Problem List Patient Active Problem List  Diagnosis  . 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 HTN (hypertension), uncontrolled  . Leg weakness, bilateral  . Tobacco abuse  . Weight loss, non-intentional  . OSA (obstructive sleep apnea), history of, but with recent weight loss less symptoms  . NICM, EF 35% at cardiac cath, 55% by echo  . CVA (cerebral infarction)  . Difficulty in walking    End of Session Activity Tolerance: Patient tolerated treatment well General Behavior During Session: [facility] for tasks performed Cognition: [facility] for tasks performed   Limmie Patricia, OTR/L 08/10/2012, 3:13 PM

## 2012-08-12 ENCOUNTER — Ambulatory Visit (HOSPITAL_COMMUNITY)
Admission: RE | Admit: 2012-08-12 | Discharge: 2012-08-12 | Disposition: A | Discharge status: Home / Self Care | Payer: Medicaid - Out of State | Source: Ambulatory Visit | Attending: Physical Medicine & Rehabilitation | Admitting: Physical Medicine & Rehabilitation

## 2012-08-12 NOTE — Progress Notes (Signed)
Occupational Therapy Treatment Patient Details  Name: Glenn Maldonado MRN: [id number] Date of Birth: 17-Nov-1960  Today's Date: 08/12/2012 Time: 6578-4696 OT Time Calculation (min): 41 min Neuro re-ed 36'  Visit#: 5  of 24   Re-eval: 08/31/12 Assessment Diagnosis: R CVA  Authorization: Medicaid pending   Authorization Time Period:    Authorization Visit#:   of    Subjective Symptoms/Limitations Symptoms: S:  I have been working my hand and fingers a lot.  Precautions/Restrictions     Exercise/Treatments Neurological Re-education Exercises Shoulder Flexion: Sidelying;15 reps;AAROM Shoulder ABduction: Sidelying;15 reps;AAROM Shoulder Protraction: Sidelying;15 reps;AAROM Shoulder Horizontal ABduction: Sidelying;15 reps;AAROM Shoulder External Rotation: Sidelying;15 reps;AAROM Shoulder Internal Rotation: Sidelying;15 reps;AAROM Forearm Supination: Seated;15 reps;AROM Forearm Pronation: Seated;15 reps;AROM Wrist Flexion: Seated;15 reps;AROM;AAROM Wrist Extension: Seated;15 reps;AAROM (with facilitation and gravity eliminated)  Weight Bearing Exercises Weight Bearing Position:  (prone)  Development of Reach  Development of Reach: Hand over hand assistance (reaching to put items in container)  Grasp and Release Grasp and Release:  (sponges)  Fine Motor Coordination Fine Motor Coordination:  (picking up sponges with tip to tip pinch)        Occupational Therapy Assessment and Plan OT Assessment and Plan Clinical Impression Statement: A:  Patient with lighter affect today.  Patient did say he fell and hurt his bottom this morning but did not want medical assistance.  Patient with increase in grasp and release when appropriate amount of rest breaks were given. OT Plan: P:  Increase proximal shoulder strength.   Goals Short Term Goals Short Term Goal 1: Patient will be educated on HEP. Short Term Goal 2: Patient will increase LUE GMC from absent to poor for increased I with  BADLs.  Short Term Goal 3: Patient will increase use of LUE as non-dominant with functional activities by 25%.  Short Term Goal 4: Patient will increase left grip strength by 5 pounds for increased ability to hold containers. Short Term Goal 5: Patient will increase LUE AROM by 5 degrees for increase independence with BADL.  Long Term Goals Long Term Goal 1: Patient will increase dynamic standing balance to Good for increased independence with BADL. Long Term Goal 2: Patient will increase LUE strength to 3+/5.  Long Term Goal 3: Patient will increase LUE GMC from poor to fair. Long Term Goal 4: Patient will increase use of LUE as non-dominant with functional activities by 50%.  Problem List Patient Active Problem List  Diagnosis  . 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 HTN (hypertension), uncontrolled  . Leg weakness, bilateral  . Tobacco abuse  . Weight loss, non-intentional  . OSA (obstructive sleep apnea), history of, but with recent weight loss less symptoms  . NICM, EF 35% at cardiac cath, 55% by echo  . CVA (cerebral infarction)  . Difficulty in walking    End of Session Activity Tolerance: Patient tolerated treatment well General Behavior During Session: [facility] for tasks performed Cognition: [facility] for tasks performed  GO   Glenn Maldonado, COTA/L  08/12/2012, 4:12 PM

## 2012-08-12 NOTE — Progress Notes (Signed)
Physical Therapy Treatment Patient Details  Name: Glenn Maldonado MRN: [id number] Date of Birth: 1960/11/20  Today's Date: 08/12/2012 Time: 4098-1191 PT Time Calculation (min): 42 min Visit#: 6  of 12   Re-eval: 08/28/12 Authorization: [place] Medicaid Pending   Charges:  therex 26', gait 12'    Subjective: Symptoms/Limitations Symptoms: Pt. states he fell on the kitchen floor, landing on his posterior L hip.  States his hip and ribs are sore. Pain Assessment Currently in Pain?: No/denies   Exercise/Treatments Standing Gait Training: w/o AD x1 A w/VC and TC for pelvic rotation x100 feet Seated Long Arc Quad: Left;10 reps Long Arc Quad Weight: 4 lbs. Other Seated Knee Exercises: Hip flexion 4# 10 reps Other Seated Knee Exercises: STS x10 without UE use; Toe Raises 1x10 with knee at 90 degrees Supine Bridges: 10 reps Straight Leg Raises: 10 reps;Left Sidelying Hip ABduction: 10 reps;Left Prone  Hamstring Curl: 10 reps Hip Extension: 10 reps     Physical Therapy Assessment and Plan PT Assessment and Plan Clinical Impression Statement: Pt. with noted increase in L toe drag today with ambulation, unsure if from weakness or just soreness from fall on L hip.  Pt .described soreness into posterior/lateral hip area beneath cheek area.  Held remaining standing activities today due to increased soreness and focused on strengthening activites.  Pt. able to complete exercises with only minor discomfort.  Suggested to pt/spouse to seek medical attn if symptoms worsen.  PT Plan: Progress balance activities, safely awareness and add exercises for quad and DF strengthening, prone for LBP and quadruped activies for WB and core stability.      Problem List Patient Active Problem List  Diagnosis  . 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 HTN (hypertension), uncontrolled  . Leg weakness, bilateral  . Tobacco abuse  . Weight loss, non-intentional    . OSA (obstructive sleep apnea), history of, but with recent weight loss less symptoms  . NICM, EF 35% at cardiac cath, 55% by echo  . CVA (cerebral infarction)  . Difficulty in walking    PT - End of Session Equipment Utilized During Treatment: Gait belt Activity Tolerance: Patient tolerated treatment well General Behavior During Session: [facility] for tasks performed Cognition: [facility] for tasks performed   Lurena Nida, PTA/CLT 08/12/2012, 5:44 PM

## 2012-08-12 NOTE — Telephone Encounter (Signed)
Pt has not responded to attempts to contact to schedule appointment. Referral filed.  

## 2012-08-14 ENCOUNTER — Ambulatory Visit (HOSPITAL_COMMUNITY): Payer: Self-pay | Admitting: Occupational Therapy

## 2012-08-17 ENCOUNTER — Ambulatory Visit (HOSPITAL_COMMUNITY)
Admission: RE | Admit: 2012-08-17 | Discharge: 2012-08-17 | Disposition: A | Discharge status: Home / Self Care | Payer: Medicaid - Out of State | Source: Ambulatory Visit | Attending: Physical Medicine & Rehabilitation | Admitting: Physical Medicine & Rehabilitation

## 2012-08-17 NOTE — Progress Notes (Signed)
Occupational Therapy Treatment Patient Details  Name: Glenn Maldonado MRN: [id number] Date of Birth: 11-28-1960  Today's Date: 08/17/2012 Time: 0103-0145 OT Time Calculation (min): 42 min  Visit#: 6  of 24   Re-eval: 08/31/12 Assessment Diagnosis: R CVA  Authorization: Medicaid pending   Authorization Time Period:    Authorization Visit#:   of    Subjective Symptoms/Limitations Symptoms: S:  I have been working and moving my arm all weekend  Precautions/Restrictions     Exercise/Treatments Hand Exercises Sponges: picking up one at a time  Neurological Re-education Exercises Shoulder Flexion: Supine;15 reps Shoulder Protraction: Supine;15 reps Shoulder External Rotation: Supine;15 reps Shoulder Internal Rotation: Supine;15 reps Elbow Flexion: Supine;15 reps Forearm Supination: Supine;15 reps Forearm Pronation: Supine;15 reps Wrist Flexion: Supine;15 reps Wrist Extension: Supine;15 reps Sponges: picking up one at a time  Occupational Therapy Assessment and Plan OT Assessment and Plan Clinical Impression Statement: A:  Patient can now hold wrist in neutral against gravity and can take resistance with eccentric contraction. OT Plan: P:  Add place and hold and rythmic stab supine and attempt SAEBO balls   Goals Short Term Goals Short Term Goal 1: Patient will be educated on HEP. Short Term Goal 2: Patient will increase LUE GMC from absent to poor for increased I with BADLs.  Short Term Goal 3: Patient will increase use of LUE as non-dominant with functional activities by 25%.  Short Term Goal 4: Patient will increase left grip strength by 5 pounds for increased ability to hold containers. Short Term Goal 5: Patient will increase LUE AROM by 5 degrees for increase independence with BADL.  Long Term Goals Long Term Goal 1: Patient will increase dynamic standing balance to Good for increased independence with BADL. Long Term Goal 2: Patient will increase LUE strength to  3+/5.  Long Term Goal 3: Patient will increase LUE GMC from poor to fair. Long Term Goal 4: Patient will increase use of LUE as non-dominant with functional activities by 50%.  Problem List Patient Active Problem List  Diagnosis  . 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 HTN (hypertension), uncontrolled  . Leg weakness, bilateral  . Tobacco abuse  . Weight loss, non-intentional  . OSA (obstructive sleep apnea), history of, but with recent weight loss less symptoms  . NICM, EF 35% at cardiac cath, 55% by echo  . CVA (cerebral infarction)  . Difficulty in walking    End of Session Activity Tolerance: Patient tolerated treatment well General Behavior During Session: [facility] for tasks performed Cognition: [facility] for tasks performed  GO   [name] L. Glora Hulgan, COTA/L  08/17/2012, 3:41 PM

## 2012-08-17 NOTE — Progress Notes (Signed)
Physical Therapy Treatment Patient Details  Name: Glenn Maldonado MRN: [id number] Date of Birth: 05-Oct-1961  Today's Date: 08/17/2012 Time: 2440-1027 PT Time Calculation (min): 36 min Charges: 12' NMR, Orthotic Management: 20'  Visit#: 7  of 12   Re-eval: 08/28/12    Authorization: [place] Medicaid Pending   Authorization Time Period:    Authorization Visit#:   of     Subjective: Symptoms/Limitations Symptoms: Pt reports that he needs his L shoulder strapped again.  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  Precautions/Restrictions     Exercise/Treatments  Standing Gait Training: w/SPC and AFO w/Min A to control impulsivness and gait speed x10 minutes [employer] [place] x2 minutes   Manual Therapy Manual Therapy: Other (comment) Other Manual Therapy: tapping to L shoulder.  Brace fitting to L foot AFO without ability to adjust.  Recommend pt seek advice and help from [place] [facility].  Gave pt number to call.   Physical Therapy Assessment and Plan PT Assessment and Plan Clinical Impression Statement: Treatment consisted of tapping L shoulder to encourage appropriate activiation, attempting brace fitting adjustments and gait training to improve balance with new AFO.  Continues to require min A secondary to increased impulsivness with gait exercises.  PT Plan: Progress balance activities, safely awareness and add exercises for quad and DF strengthening, prone for LBP and quadruped activies for WB and core stability.     Goals    Problem List Patient Active Problem List  Diagnosis  . 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 HTN (hypertension), uncontrolled  . Leg weakness, bilateral  . Tobacco abuse  . Weight loss, non-intentional  . OSA (obstructive sleep apnea), history of, but with recent weight loss less symptoms  . NICM, EF 35% at cardiac cath, 55%  by echo  . CVA (cerebral infarction)  . Difficulty in walking    PT - End of Session Equipment Utilized During Treatment: Gait belt Activity Tolerance: Patient tolerated treatment well General Behavior During Session: [facility] for tasks performed Cognition: [facility] for tasks performed  Paul Torpey, PT 08/17/2012, 2:34 PM

## 2012-08-19 ENCOUNTER — Ambulatory Visit (HOSPITAL_COMMUNITY): Payer: Self-pay | Admitting: Occupational Therapy

## 2012-08-20 ENCOUNTER — Ambulatory Visit (HOSPITAL_BASED_OUTPATIENT_CLINIC_OR_DEPARTMENT_OTHER): Payer: Self-pay | Admitting: Physical Medicine & Rehabilitation

## 2012-08-20 ENCOUNTER — Encounter: Payer: Medicaid - Out of State | Attending: Physical Medicine & Rehabilitation

## 2012-08-20 ENCOUNTER — Encounter: Payer: Self-pay | Admitting: Physical Medicine & Rehabilitation

## 2012-08-20 VITALS — BP 159/88 | HR 80 | Resp 14 | Ht 70.0 in | Wt 173.0 lb

## 2012-08-20 DIAGNOSIS — R209 Unspecified disturbances of skin sensation: Secondary | ICD-10-CM | POA: Insufficient documentation

## 2012-08-20 DIAGNOSIS — M549 Dorsalgia, unspecified: Secondary | ICD-10-CM | POA: Insufficient documentation

## 2012-08-20 DIAGNOSIS — R5381 Other malaise: Secondary | ICD-10-CM | POA: Insufficient documentation

## 2012-08-20 DIAGNOSIS — G8110 Spastic hemiplegia affecting unspecified side: Secondary | ICD-10-CM

## 2012-08-20 DIAGNOSIS — I69919 Unspecified symptoms and signs involving cognitive functions following unspecified cerebrovascular disease: Secondary | ICD-10-CM | POA: Insufficient documentation

## 2012-08-20 DIAGNOSIS — R269 Unspecified abnormalities of gait and mobility: Secondary | ICD-10-CM | POA: Insufficient documentation

## 2012-08-20 DIAGNOSIS — T753XXA Motion sickness, initial encounter: Secondary | ICD-10-CM | POA: Insufficient documentation

## 2012-08-20 DIAGNOSIS — I69959 Hemiplegia and hemiparesis following unspecified cerebrovascular disease affecting unspecified side: Secondary | ICD-10-CM | POA: Insufficient documentation

## 2012-08-20 NOTE — Progress Notes (Signed)
Subjective:    Patient ID: Glenn Maldonado, male    DOB: 11-18-1960, 51 y.o.   MRN: [id number]  HPI Glenn Maldonado is a 51 year old right-  handed male with history of hypertension, hep C, who was admitted on  June 28, 2012, with complaints of chest pressure and difficulty  walking with left arm heaviness and tingling sensation. BP was noted to  be elevated at 178/118 and EKG showed ST changes. The patient was  started on IV heparin. CT of head then showed no acute changes. He  underwent cardiac cath revealing global hypokinesis with EF of 40-45%  and nonobstructive coronary artery disease. The patient had hypotension  with BP drop to 97/46 despite fluid bolus. He required dopamine for  stabilization. He developed facial droop with dysarthria and worsening  of left hemiparesis. Neurology was consulted for input and felt the  patient likely with thrombotic infarct. Workup was initiated. Carotid  Dopplers done, showed 40-59% left distal ICA stenosis. MRI of brain  showed acute infarcts, posterior limb of right internal capsule and  parietal white matter bilaterally.   D/C to home 9/28 [place] Seat on back side 2 times because he was in a hurry Outpt PT,OT Numbness in L side of face  Pain Inventory Average Pain 5 Pain Right Now 1 My pain is intermittent and aching  In the last 24 hours, has pain interfered with the following? General activity 10 Relation with others 10 Enjoyment of life 10 What TIME of day is your pain at its worst? evening Sleep (in general) Fair  Pain is worse with: walking, bending, sitting, standing and some activites Pain improves with: rest and medication Relief from Meds: 6  Mobility walk with assistance use a cane how many minutes can you walk? 5 ability to climb steps?  yes do you drive?  no Do you have any goals in this area?  yes  Function employed # of [employer]  Neuro/Psych weakness numbness trouble walking  Prior  Studies x-rays CT/MRI  Physicians involved in your care Any changes since last visit?  no   Family History  Problem Relation Age of Onset  . Coronary artery disease Mother   . Peripheral vascular disease Father   . HIV Brother    History   Social History  . Marital Status: Married    Spouse Name: N/A    Number of Children: N/A  . Years of Education: N/A   Social History Main Topics  . Smoking status: Former Games developer  . Smokeless tobacco: Never Used  . Alcohol Use: No     stopped in 2001  . Drug Use: No  . Sexually Active: None   Other Topics Concern  . None   Social History Narrative  . None   History reviewed. No pertinent past surgical history. Past Medical History  Diagnosis Date  . Hypertension   . Borderline diabetic   . STEMI (ST elevation myocardial infarction) 06/28/2012  . HTN (hypertension), uncontrolled 06/28/2012  . Leg weakness, bilateral 06/28/2012  . Tobacco abuse 06/28/2012  . Weight loss, non-intentional 06/28/2012  . OSA (obstructive sleep apnea), history of, but with recent weight loss less symptoms 06/28/2012  . Hepatitis C 2001    treated with interferon  . NICM (nonischemic cardiomyopathy), EF 35% at cardiac cath 06/28/2012  . Stroke    BP 159/88  Pulse 80  Resp 14  Ht 5\' 10"  (1.778 m)  Wt 173 lb (78.472 kg)  BMI 24.82 kg/m2  SpO2 98%  Review of Systems  Musculoskeletal: Positive for back pain, arthralgias and gait problem.  Neurological: Positive for weakness and numbness.  All other systems reviewed and are negative.       Objective:   Physical Exam  Constitutional: He is oriented to person, place, and time. He appears well-developed and well-nourished.  HENT:  Head: Normocephalic and atraumatic.  Eyes: Conjunctivae normal are normal. Pupils are equal, round, and reactive to light.  Neurological: He is alert and oriented to person, place, and time. He displays abnormal reflex. No sensory deficit. He exhibits abnormal  muscle tone. Coordination and gait abnormal.  Reflex Scores:      Tricep reflexes are 2+ on the right side and 3+ on the left side.      Bicep reflexes are 2+ on the right side and 3+ on the left side.      Brachioradialis reflexes are 2+ on the right side and 3+ on the left side.      Patellar reflexes are 2+ on the right side and 3+ on the left side.      Achilles reflexes are 2+ on the right side and 3+ on the left side.      3-/5 on Left side Delt bi, tri  2-/5 Left grip  4-/5 Quad. 3-/5 HF 2-/5 PF and DF   Skin:       L pretibial papule   Psychiatric: He has a normal mood and affect.          Assessment & Plan:  1.  R PLIC infarct as well as parietal infarcts,  Not needing SLP Cognition improving but no driving recommended Still has motion sickness Cont PT,OT RTC 1 mo Pt qualifies for disability OK [place]

## 2012-08-20 NOTE — Patient Instructions (Addendum)
No driving  May accompany wife to jobsite See me in 1 month

## 2012-08-21 ENCOUNTER — Ambulatory Visit (HOSPITAL_COMMUNITY)
Admission: RE | Admit: 2012-08-21 | Discharge: 2012-08-21 | Disposition: A | Discharge status: Home / Self Care | Payer: Medicaid - Out of State | Source: Ambulatory Visit

## 2012-08-21 ENCOUNTER — Ambulatory Visit (HOSPITAL_COMMUNITY)
Admission: RE | Admit: 2012-08-21 | Discharge: 2012-08-21 | Disposition: A | Discharge status: Home / Self Care | Payer: Medicaid - Out of State | Source: Ambulatory Visit | Attending: Physical Medicine & Rehabilitation | Admitting: Physical Medicine & Rehabilitation

## 2012-08-21 ENCOUNTER — Ambulatory Visit (HOSPITAL_COMMUNITY): Payer: Self-pay | Admitting: Specialist

## 2012-08-21 NOTE — Progress Notes (Signed)
Physical Therapy Treatment Patient Details  Name: Kalim Kissel MRN: [id number] Date of Birth: 18-Nov-1960  Today's Date: 08/21/2012 Time: 1351-1430 PT Time Calculation (min): 39 min Charges: 25' TE, 14' NMR  Visit#: 8  of 12   Re-eval: 08/28/12    Authorization: [place] Medicaid Pending   Authorization Time Period:    Authorization Visit#:   of     Subjective: Symptoms/Limitations Symptoms: Pt reports that he did a lot of walking yesterday at the doctors apt with his SPC and AFO.  Dr. Wynn Banker showed pt wife how to adjust AFO, also suggested to see an orthotist to adjust it properly.   Precautions/Restrictions     Exercise/Treatments Stretches Active Hamstring Stretch: 3 reps;30 seconds Supine Straight Leg Raises: Left;15 reps Sidelying Hip ABduction: AAROM;Left;15 reps Hip ADduction: AROM;15 reps Clams: 10x10 sec holds Prone  Hamstring Curl: 20 reps;Limitations Hamstring Curl Limitations: 3# Hip Extension: 20 reps Seated Static Sitting: Eyes opened;No upper extremity support;No lower extremity support;Time Static Sitting Time: x5 minutes on Dyna Disc w/pevlic rotation, S<>S and F<>B x10 each Dynamic Sitting: No upper extremity support;No lower extremity support;Time;Eyes opened Dynamic Sitting Time: w/shoulder flexion x10, BLE LAQ x10 each  Standing: Gait x 50' w/min A  Physical Therapy Assessment and Plan PT Assessment and Plan Clinical Impression Statement: Pt continues to show improvement with overall coordinated movements to his LLE, however is limited by his strength.  his balance continues to improve and plans to see an orthotist to adjust his AFO.  PT Plan: Continue with LE strengthening activities: squats, heel and toe raises, stair training.  Continue to address balance.      Goals    Problem List Patient Active Problem List  Diagnosis  . 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 HTN (hypertension),  uncontrolled  . Leg weakness, bilateral  . Tobacco abuse  . Weight loss, non-intentional  . OSA (obstructive sleep apnea), history of, but with recent weight loss less symptoms  . NICM, EF 35% at cardiac cath, 55% by echo  . CVA (cerebral infarction)  . Difficulty in walking   PT - End of Session Equipment Utilized During Treatment: Gait belt Activity Tolerance: Patient tolerated treatment well General Behavior During Session: [facility] for tasks performed Cognition: [facility] for tasks performed  Samyak Sackmann, PT 08/21/2012, 2:34 PM

## 2012-08-21 NOTE — Progress Notes (Signed)
Occupational Therapy Treatment Patient Details  Name: Barbara Calcano MRN: [id number] Date of Birth: [birth year]  Today's Date: 08/21/2012 Time: 8295-6213 OT Time Calculation (min): 30 min Neuro reed 30' Visit#: 7  of 24   Re-eval: 08/31/12    Authorization: Medicaid pending   Authorization Time Period:    Authorization Visit#:   of    Subjective S:  I went to the MD yesterday and he was really pleased with my progress.  I went and saw my old therapists at the hospital.  It wore me out. Pain Assessment Currently in Pain?: No/denies Pain Score: 0-No pain  Precautions/Restrictions    N/A Exercise/Treatments Neurological Re-education Exercises Shoulder Flexion: AAROM;10 reps Shoulder ABduction: Supine;10 reps;AAROM Shoulder Protraction: Supine;10 reps;AAROM Shoulder External Rotation: Supine;10 reps;AAROM Shoulder Internal Rotation: Supine;10 reps;AAROM Elbow Extension: Supine;10 reps;AAROM Forearm Supination: Supine;10 reps;AAROM Forearm Pronation: Supine;10 reps;AAROM Wrist Flexion: Supine;10 reps;AAROM Wrist Extension: Supine;10 reps;AAROM  Weight Bearing Exercises Weight Bearing Position: Seated Seated with weight on hand: with min facilitation to maintain hand position.  able to Ily shift weight on and off of LUE x 15 reps.  Educted on weightbearing at home.  Development of Reach  Development of Reach: Hand over hand assistance Seated:Hand over Hand Assistance while Reaching: cued him to think of power comint from base of hand to avoid shoulder elevation.  reached into varying positions of flexion and abduction and ext rot.  x 10 reps.    Supine reaching for target x 20 reps and proximal shoulder strengthening with mod facilitation.  Arm fatigued quickly with reaching activity in supine. Grasp and Release Grasp and Release:  (Hand over hand assist to release with mod assist and grasp with min pa)   Occupational Therapy Assessment and Plan OT Assessment and  Plan Clinical Impression Statement: A:  Able to weightbear on LUE in seated and sidelying with min facilitation for hand position only.  He commented he liked the feeling of weightbearing.  Added weightbearing to HEP.  Good development of reach in supine and seated with min vg and occasional tactile cues to extend elbow when reaching. OT Plan: P:  Complete weightbearing and development of reach in supine at each session.  work on active release of objects with min-mod facilitation.   Goals Short Term Goals Short Term Goal 1: Patient will be educated on HEP. Short Term Goal 1 Progress: Progressing toward goal Short Term Goal 2: Patient will increase LUE GMC from absent to poor for increased I with BADLs.  Short Term Goal 2 Progress: Progressing toward goal Short Term Goal 3: Patient will increase use of LUE as non-dominant with functional activities by 25%.  Short Term Goal 3 Progress: Progressing toward goal Short Term Goal 4: Patient will increase left grip strength by 5 pounds for increased ability to hold containers. Short Term Goal 4 Progress: Progressing toward goal Short Term Goal 5: Patient will increase LUE AROM by 5 degrees for increase independence with BADL.  Short Term Goal 5 Progress: Progressing toward goal Long Term Goals Long Term Goal 1: Patient will increase dynamic standing balance to Good for increased independence with BADL. Long Term Goal 1 Progress: Progressing toward goal Long Term Goal 2: Patient will increase LUE strength to 3+/5.  Long Term Goal 2 Progress: Progressing toward goal Long Term Goal 3: Patient will increase LUE GMC from poor to fair. Long Term Goal 3 Progress: Progressing toward goal Long Term Goal 4: Patient will increase use of LUE as non-dominant with functional activities by  50%. Long Term Goal 4 Progress: Progressing toward goal  Problem List Patient Active Problem List  Diagnosis  . STE on ECG - not MI; non-obstructive CAD on Cath, most  likely related to HTN  . HTN (hypertension), uncontrolled  . Leg weakness, bilateral  . Tobacco abuse  . Weight loss, non-intentional  . OSA (obstructive sleep apnea), history of, but with recent weight loss less symptoms  . NICM, EF 35% at cardiac cath, 55% by echo  . CVA (cerebral infarction)  . Difficulty in walking    End of Session Activity Tolerance: Patient tolerated treatment well General Behavior During Session: [facility] for tasks performed Cognition: [facility] for tasks performed  GO    Jacqualine Code 08/21/2012, 2:29 PM

## 2012-08-24 ENCOUNTER — Ambulatory Visit (HOSPITAL_COMMUNITY)
Admission: RE | Admit: 2012-08-24 | Discharge: 2012-08-24 | Disposition: A | Discharge status: Home / Self Care | Payer: Medicaid - Out of State | Source: Ambulatory Visit | Attending: Physical Medicine & Rehabilitation | Admitting: Physical Medicine & Rehabilitation

## 2012-08-24 NOTE — Progress Notes (Signed)
Physical Therapy Treatment Patient Details  Name: Glenn Maldonado MRN: [id number] Date of Birth: Jun 15, 1961  Today's Date: 08/24/2012 Time: 4098-1191 PT Time Calculation (min): 41 min  Visit#: 9  of 12   Re-eval: 08/28/12 Charges: NMR x 28' Gait x 10'  Authorization: [place] Medicaid Pending    Subjective: Symptoms/Limitations Symptoms: Pt reports soreness in lower back and buttocks. Pain Assessment Currently in Pain?: Yes Pain Score:   3 Pain Location: Back (Buttocks) Pain Orientation: Lower   Exercise/Treatments Standing Gait Training: Gait w/o AD with min assist x 360'  Seated Other Seated Knee Exercises: STS x 10 w/o UE assist  Supine Bridges: 15 reps Straight Leg Raises: 15 reps;Left Sidelying Hip ABduction: 15 reps;AAROM;Left Balance Exercises Standing Standing Eyes Opened: Narrow base of support (BOS);30 secs;Solid surface;1 rep Standing Eyes Closed: Narrow base of support (BOS);Solid surface;3 reps;30 secs Wall Bumps: 10 reps;Hips   Physical Therapy Assessment and Plan PT Assessment and Plan Clinical Impression Statement: Pt displays multiple LOB with gait training requiring min assist to recover. Began wall bumps to improve proprioceptive control. Pt displays improved abductor strength, requiring less assistance with SL hip abduction. PT Plan: Continue with LE strengthening and balance activities per PT POC.     Problem List Patient Active Problem List  Diagnosis  . 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 HTN (hypertension), uncontrolled  . Leg weakness, bilateral  . Tobacco abuse  . Weight loss, non-intentional  . OSA (obstructive sleep apnea), history of, but with recent weight loss less symptoms  . NICM, EF 35% at cardiac cath, 55% by echo  . CVA (cerebral infarction)  . Difficulty in walking    PT - End of Session Equipment Utilized During Treatment: Gait belt Activity Tolerance: Patient tolerated  treatment well General Behavior During Session: [facility] for tasks performed Cognition: [facility] for tasks performed  Seth Bake, PTA 08/24/2012, 4:07 PM

## 2012-08-28 ENCOUNTER — Ambulatory Visit (HOSPITAL_COMMUNITY)
Admission: RE | Admit: 2012-08-28 | Discharge: 2012-08-28 | Disposition: A | Discharge status: Home / Self Care | Payer: Medicaid - Out of State | Source: Ambulatory Visit | Attending: Physical Medicine & Rehabilitation | Admitting: Physical Medicine & Rehabilitation

## 2012-08-28 NOTE — Progress Notes (Addendum)
Occupational Therapy Treatment Patient Details  Name: Glenn Maldonado MRN: [id number] Date of Birth: 1961-05-20  Today's Date: 08/28/2012 Time: 1300-1345 OT Time Calculation (min): 45 min Massage  1300-1315 Therex 1315-1330 Thereact 4098-1191  Visit#: 8  of 24   Re-eval: 08/31/12    Authorization: Medicare pending  Authorization Time Period:    Authorization Visit#:   of    Subjective Symptoms/Limitations Symptoms: S: I worked my arm hard yesterday and now my shoulder hurts. Pain Assessment Currently in Pain?: Yes Pain Score:   6 Pain Location: Shoulder Pain Orientation: Left Pain Type: Acute pain Pain Onset: In the past 7 days Pain Frequency: Intermittent  Precautions/Restrictions   Left hemiparesis  Exercise/Treatments     08/28/12 1300  General Exercises - Upper Extremity  Shoulder Flexion AAROM;Left;10 reps;Supine  Shoulder Extension AAROM;Left;10 reps;Supine  Shoulder ABduction PROM;Left;10 reps;Supine;AAROM  Shoulder ADduction PROM;Left;10 reps;Supine;AAROM  Shoulder Horizontal ABduction PROM;Left;10 reps;Supine  Shoulder Horizontal ADduction PROM;Left;10 reps;Supine  Elbow Flexion AAROM;Left;10 reps;Supine  Elbow Extension AAROM;Left;10 reps;Supine               Weight Bearing Exercises  Weight Bearing Position Seated  Seated with weight on hand with min facilitation to maintain hand position. x5 reps; educated on weightbearing at home.  Seated with weight on forearm with supervision x 5reps    08/28/12 1400  General Exercises - Upper Extremity  Wrist Flexion AAROM;AROM;Left;10 reps;Seated  Wrist Flexion Limitations gravity eliminated plane  Wrist Extension PROM;AAROM;Left;10 reps;Seated  Digit Composite Flexion AROM;Left;10 reps;Seated  Composite Flexion Limitations gravity eliminated plane  Composite Extension PROM;Left;10 reps;Seated  Composite Extension Limitations gravity eliminated plane  Hand Exercises  Forearm Supination AROM;Left;10  reps;Supine  Forearm Pronation AROM;Left;10 reps;Supine    Manual Therapy Massage: Trigger point massage performed to left shoulder deltoid to decrease pain level, increase mobility, and decrease restrictions. [id number] Splinting Splinting: Strapping to LUE to decrease pain level, provide joint stabiility, and allow for pain free movement.   Occupational Therapy Assessment and Plan OT Assessment and Plan Clinical Impression Statement: A: Pt reports pain in left shoulder during shoulder flexion and abduction. Pt may have been overworking left arm at home. Re-educated on HEP and amount of effort and time that should be spent on exercising  at home to decrease chance of injury and joint overuse.  OT Plan: P: Continue weightbeaering and development of reach in supine in each session. Work on active release of objects with min-mod facilitation. Re-eval next session.   Goals Short Term Goals Short Term Goal 1: Patient will be educated on HEP. Short Term Goal 2: Patient will increase LUE GMC from absent to poor for increased I with BADLs.  Short Term Goal 3: Patient will increase use of LUE as non-dominant with functional activities by 25%.  Short Term Goal 4: Patient will increase left grip strength by 5 pounds for increased ability to hold containers. Short Term Goal 5: Patient will increase LUE AROM by 5 degrees for increase independence with BADL.  Long Term Goals Long Term Goal 1: Patient will increase dynamic standing balance to Good for increased independence with BADL. Long Term Goal 2: Patient will increase LUE strength to 3+/5.  Long Term Goal 3: Patient will increase LUE GMC from poor to fair. Long Term Goal 4: Patient will increase use of LUE as non-dominant with functional activities by 50%.  Problem List Patient Active Problem List  Diagnosis  . 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  HTN (hypertension), uncontrolled  . Leg weakness,  bilateral  . Tobacco abuse  . Weight loss, non-intentional  . OSA (obstructive sleep apnea), history of, but with recent weight loss less symptoms  . NICM, EF 35% at cardiac cath, 55% by echo  . CVA (cerebral infarction)  . Difficulty in walking    General Behavior During Session: [facility] for tasks performed Cognition: [facility] for tasks performed   Limmie Patricia, OTR/L 08/28/2012, 2:09 PM

## 2012-08-28 NOTE — Evaluation (Signed)
Physical Therapy Re-Evaluation  Patient Details  Name: Glenn Maldonado MRN: [id number] Date of Birth: June 02, 1961  Today's Date: 08/28/2012 Time: 0865-7846  PT Time Calculation (min): 38 min Charges: 20' PPT, 18' Self Care Visit#: 10  of 12   Re-eval: 08/28/12   Authorization: [place] Medicaid Pending   Authorization Time Period:    Authorization Visit#:   of      Subjective  Symptoms: Pt reports that he feels that he is doing a little better.  He is currently ambulating with his AFO and quad cane at home.  Continues to use his transfer chair in the community. Pt wants to gain his hand function back and is frustrated with his arm not working as well as he would like.   Assessment LLE Strength LLE Overall Strength Comments: hip flexion 3/5 (was 3/5); knee extension 5/5 (was 4/5); knee flexion 5/5 (was 4-/5); ankle DF 3/5 (was 1/5)  Exercise/Treatments Berg Balance Test Sit to Stand: Able to stand without using hands and stabilize independently Standing Unsupported: Able to stand safely 2 minutes Sitting with Back Unsupported but Feet Supported on Floor or Stool: Able to sit safely and securely 2 minutes Stand to Sit: Controls descent by using hands Transfers: Able to transfer safely, definite need of hands Standing Unsupported with Eyes Closed: Able to stand 10 seconds safely Standing Ubsupported with Feet Together: Able to place feet together independently and stand 1 minute safely From Standing, Reach Forward with Outstretched Arm: Can reach forward >12 cm safely (5") From Standing Position, Pick up Object from Floor: Able to pick up shoe, needs supervision From Standing Position, Turn to Look Behind Over each Shoulder: Looks behind one side only/other side shows less weight shift Turn 360 Degrees: Able to turn 360 degrees safely but slowly Standing Unsupported, Alternately Place Feet on Step/Stool: Able to complete >2 steps/needs minimal assist Standing Unsupported, One Foot in  Front: Able to plae foot ahead of the other independently and hold 30 seconds Standing on One Leg: Able to lift leg independently and hold equal to or more than 3 seconds Total Score: 43/56  Dynamic Gait Index Level Surface: Moderate Impairment Change in Gait Speed: Severe Impairment Gait with Horizontal Head Turns: Severe Impairment Gait with Vertical Head Turns: Severe Impairment Gait and Pivot Turn: Severe Impairment Step Over Obstacle: Severe Impairment Step Around Obstacles: Severe Impairment Steps: Severe Impairment Total Score: 1/22  Timed Up and Go Test TUG: Normal TUG Normal TUG (seconds): 18  (w/quad cane and CGA)   Physical Therapy Assessment and Plan PT Assessment and Plan Clinical Impression Statement:  Re-eval complete today. Mr. Pooler has made signficant gains with overall strength and ambulation ability. Continues to have greatest limitations with overall balance and dynamic balance putting him at an increased fall risk.  He will continue to benefit from skilled OP PT.  Pt was given financial advisors name and number to contact.  PT Plan: Continue with LE strengthening and balance activities per PT POC.    Goals Home Exercise Program Pt will Perform Home Exercise Program: Independently PT Goal: Perform Home Exercise Program - Progress: Met PT Short Term Goals Time to Complete Short Term Goals: 4 weeks PT Short Term Goal 1: Pt will ambulate with S w/quad cane PT Short Term Goal 1 - Progress: Met PT Short Term Goal 2: Pt will demonstrate Tandem stance x10 sec on static surface.  PT Short Term Goal 3: Pt will complete Berg Balance test and DGI.  PT Short Term Goal 4:  Pt will improve TUG to less than 13 sec w/S and LRAD for safe community ambulation.  PT Short Term Goal 4 - Progress: Progressing toward goal (18 seconds) PT Long Term Goals Time to Complete Long Term Goals: 8 weeks PT Long Term Goal 1: Pt will improve his LE strength to WNL in order to tolerate  ambulating x15 minutes w/LRAD in order to return to community ambulation. PT Long Term Goal 2: Pt will improve his dynamic balance and demonstrate ambulating on grass surfaces w/LRAD and S in order to return to outdoor activities.  Long Term Goal 3: Pt will score a 50/56 on Berg and 18/24 on DGI for safe community ambulation.  Long Term Goal 4: Pt will improve gait speed to 3 feet/sec for normaltive values for age and ambulate 360 ft in 2 minutes.   Problem List Patient Active Problem List  Diagnosis  . 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 HTN (hypertension), uncontrolled  . Leg weakness, bilateral  . Tobacco abuse  . Weight loss, non-intentional  . OSA (obstructive sleep apnea), history of, but with recent weight loss less symptoms  . NICM, EF 35% at cardiac cath, 55% by echo  . CVA (cerebral infarction)  . Difficulty in walking   PT - End of Session Equipment Utilized During Treatment: Gait belt Activity Tolerance: Patient tolerated treatment well General Behavior During Session: [facility] for tasks performed Cognition: [facility] for tasks performed PT Plan of Care PT Patient Instructions: Pt and wife given finacial obligation paper and given Kathie Rhodes Ratiliff's number.  Talked at length with patient about using his LUE for functional tasks.  Consulted and Agree with Plan of Care: Patient and Wife  Annett Fabian, PT 08/28/2012, 2:17 PM  Physician Documentation Your signature is required to indicate approval of the treatment plan as stated above.  Please sign and either send electronically or make a copy of this report for your files and return this physician signed original.   Please mark one 1.__approve of plan  2. ___approve of plan with the following conditions.   ______________________________                                                          _____________________ Physician Signature                                                                                                              Date

## 2012-08-31 ENCOUNTER — Ambulatory Visit (HOSPITAL_COMMUNITY)
Admission: RE | Admit: 2012-08-31 | Discharge: 2012-08-31 | Disposition: A | Discharge status: Home / Self Care | Payer: Medicaid - Out of State | Source: Ambulatory Visit | Attending: Physical Medicine & Rehabilitation | Admitting: Physical Medicine & Rehabilitation

## 2012-08-31 NOTE — Progress Notes (Signed)
Physical Therapy Treatment Patient Details  Name: Glenn Maldonado MRN: [id number] Date of Birth: [birth year]  Today's Date: 08/31/2012 Time: 1478-2956 PT Time Calculation (min): 36 min Charges: 36' Self Care Visit#: 11  of 12   Re-eval: 09/30/12   Subjective: Symptoms/Limitations Symptoms: Pt comes in today with significant discussions about strain on his marriage and wanting for "it" to be over.   Pain Assessment Currently in Pain?: Yes Pain Score:   5 Pain Location: Shoulder Pain Orientation: Left Pain Type: Acute pain  Exercise/Treatments Discussed in great detail about support groups and aspects to improve his QOL outlook.   Physical Therapy Assessment and Plan PT Assessment and Plan Clinical Impression Statement: Pt comes in today notably distraught.  he is emotionally liable and is having conversations about his current frusturations with his mobility and his marriage.  Educated and discussed importance of finding a support group.  Attempted to find a local support group for him using the internet, without success.  Encouraged him and wife to be proactive and find resources to help during this time of change.  unable to perform any activities today secondary to pt emotionally unstable today.     Goals    Problem List Patient Active Problem List  Diagnosis  . 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 HTN (hypertension), uncontrolled  . Leg weakness, bilateral  . Tobacco abuse  . Weight loss, non-intentional  . OSA (obstructive sleep apnea), history of, but with recent weight loss less symptoms  . NICM, EF 35% at cardiac cath, 55% by echo  . CVA (cerebral infarction)  . Difficulty in walking    General Behavior During Session: [facility] for tasks performed Cognition: [facility] for tasks performed PT Plan of Care Consulted and Agree with Plan of Care: Patient;Family member/caregiver Family Member Consulted: wife   GP    Annett Fabian,  PT 08/31/2012, 2:31 PM

## 2012-08-31 NOTE — Progress Notes (Signed)
Occupational Therapy Treatment Patient Details  Name: Glenn Maldonado MRN: [id number] Date of Birth: Oct 25, 1961  Today's Date: 08/31/2012 Time: 4696-2952 OT Time Calculation (min): 37 min Reassessment 8413-2440  Neuro reeducation 22' Visit#: 9  of 24   Re-eval: 09/28/12    Authorization: Medicaid pending   Subjective S:  I can turn lights on without throwing my arm up. Pain Assessment Currently in Pain?: Yes Pain Score:   5 Pain Location: Shoulder Pain Orientation: Left Pain Type: Acute pain  Precautions/Restrictions   N/A  Exercise/Treatments   Neurological Re-education  08/31/12 1300  Neurological Re-education Exercises  Wrist Flexion AAROM;15 reps  Wrist Extension AAROM;15 reps  Development of Reach  Development of Reach Reaching  Reaching to Shoulder Height in supine with OT support at wrist for guidance.  reaching into various degrees of  flexion, abduction, and horizontal abduction with mod vg and cuing for full elbow extension with reach.  10 reps rested, completed 10 reps of elbow extension with moderate resistance from OT, then repeated development of reach activity 10 reps x 2 sets. ended with 10 reps of elbow flexionwith supinated forearm and extension of elbow with pronated forearm.    Grasp and Release  Grasp and Release (grasp and release with hand over hand faciliation from OT. )  Proximal shoulder strengthening in supine 15 reps of each exercise, resting between each set x 2. Rhythmic stabilization in supine x 5" - fatigued very quickly.   Occupational Therapy Assessment and Plan OT Assessment and Plan Clinical Impression Statement: A: Please see MD progress note. Patient has met all short term goals. He is able to use his left upper extremity to assist with functional activiteis such as reaching up to trun a light switch on and off.   OT Plan: P:  Increase gross extension of digits to 50% in order to use left hand more functionally with daily activities.       Goals    Problem List Patient Active Problem List  Diagnosis  . 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 HTN (hypertension), uncontrolled  . Leg weakness, bilateral  . Tobacco abuse  . Weight loss, non-intentional  . OSA (obstructive sleep apnea), history of, but with recent weight loss less symptoms  . NICM, EF 35% at cardiac cath, 55% by echo  . CVA (cerebral infarction)  . Difficulty in walking       GO    Shirlean Mylar, OTR/L  08/31/2012, 9:16 PM

## 2012-09-02 NOTE — Progress Notes (Signed)
Occupational Therapy Treatment Patient Details  Name: Glenn Maldonado MRN: [id number] Date of Birth: 25-Sep-1961  Today's Date: 09/02/2012 Time: 7829-5621 45'  Neuro re-ed 45'  Visit#: 9 of   24     Re-eval: 09/28/12    Authorization: Medicaid pending  Authorization Time Period:    Authorization Visit#:   of    Subjective Symptoms/Limitations Symptoms: S:  Look what my hand can do now Pain Assessment Currently in Pain?: Yes Pain Score:   3 Pain Location: Back (Buttocks) Pain Orientation: Lower  Precautions/Restrictions     Exercise/Treatments  08/24/12 1748  Shoulder Exercises: Supine  Protraction AAROM;15 reps  Horizontal ABduction AAROM;15 reps  External Rotation AAROM;15 reps  Internal Rotation AAROM;15 reps  Flexion AAROM;15 reps  ABduction AAROM;15 reps  Shoulder Exercises: Therapy Ball  Flexion 15 reps  ABduction 15 reps    08/24/12 1749  Weight Bearing Exercises  Weight Bearing Position Seated (doing chair pushup)  Development of Reach  Development of Reach Bean bags  Hand over Hand Assistance while Reaching focusing on release  Grasp and Release  Grasp and Release (with bean bags)  Fine Motor Coordination  Fine Motor Coordination Thumb opposition;Digit adduction;Digit abduction;Digit  Digit Composite Abduction AAROM;AROM;10 reps  Digit Composite Adduction AROM;AAROM;10 reps  Opposition AROM;AAROM;10 reps   Occupational Therapy Assessment and Plan    End of Session  Activity Tolerance Patient tolerated treatment well  General  Behavior During Session [facility] for tasks performed  Cognition [facility] for tasks performed  OT Assessment and Plan  Clinical Impression Statement A:  Patient had increase in AROM of thumb today and completed opposition with AAROM.  OT Plan P:  Reassess for mothly progress note.    Goals    08/24/12 1800  Short Term Goals  Short Term Goal 1 Patient will be educated on HEP.  Short Term Goal 2 Patient will increase LUE GMC from  absent to poor for increased I with BADLs.   Short Term Goal 3 Patient will increase use of LUE as non-dominant with functional activities by 25%.   Short Term Goal 4 Patient will increase left grip strength by 5 pounds for increased ability to hold containers.  Short Term Goal 5 Patient will increase LUE AROM by 5 degrees for increase independence with BADL.   Long Term Goals  Long Term Goal 1 Patient will increase dynamic standing balance to Good for increased independence with BADL.  Long Term Goal 2 Patient will increase LUE strength to 3+/5.   Long Term Goal 3 Patient will increase LUE GMC from poor to fair.  Long Term Goal 4 Patient will increase use of LUE as non-dominant with functional activities by 50%.    Problem List Patient Active Problem List  Diagnosis  . 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 HTN (hypertension), uncontrolled  . Leg weakness, bilateral  . Tobacco abuse  . Weight loss, non-intentional  . OSA (obstructive sleep apnea), history of, but with recent weight loss less symptoms  . NICM, EF 35% at cardiac cath, 55% by echo  . CVA (cerebral infarction)  . Difficulty in walking       GO    Glenn Maldonado L 09/02/2012, 5:56 PM

## 2012-09-04 ENCOUNTER — Ambulatory Visit (HOSPITAL_COMMUNITY)
Admission: RE | Admit: 2012-09-04 | Discharge: 2012-09-04 | Disposition: A | Discharge status: Home / Self Care | Payer: Self-pay | Source: Ambulatory Visit | Attending: Physical Medicine & Rehabilitation | Admitting: Physical Medicine & Rehabilitation

## 2012-09-04 DIAGNOSIS — I10 Essential (primary) hypertension: Secondary | ICD-10-CM | POA: Insufficient documentation

## 2012-09-04 DIAGNOSIS — R29898 Other symptoms and signs involving the musculoskeletal system: Secondary | ICD-10-CM | POA: Insufficient documentation

## 2012-09-04 DIAGNOSIS — R279 Unspecified lack of coordination: Secondary | ICD-10-CM | POA: Insufficient documentation

## 2012-09-04 DIAGNOSIS — IMO0001 Reserved for inherently not codable concepts without codable children: Secondary | ICD-10-CM | POA: Insufficient documentation

## 2012-09-04 DIAGNOSIS — R209 Unspecified disturbances of skin sensation: Secondary | ICD-10-CM | POA: Insufficient documentation

## 2012-09-04 DIAGNOSIS — R269 Unspecified abnormalities of gait and mobility: Secondary | ICD-10-CM | POA: Insufficient documentation

## 2012-09-04 DIAGNOSIS — I69998 Other sequelae following unspecified cerebrovascular disease: Secondary | ICD-10-CM | POA: Insufficient documentation

## 2012-09-04 NOTE — Progress Notes (Signed)
Occupational Therapy Treatment Patient Details  Name: Christine Schweitzer MRN: [id number] Date of Birth: 26-Apr-1961  Today's Date: 09/04/2012 Time: 0103-0145 OT Time Calculation (min): 42 min Neuro re-ed 42'  Visit#: 11  of 24   Re-eval: 09/28/12 Assessment Diagnosis: R CVA  Authorization: Medicaid pending   Authorization Time Period:    Authorization Visit#:   of    Subjective Symptoms/Limitations Symptoms: S:  I get so frustrated with this stupid hand Pain Assessment Currently in Pain?: No/denies Pain Score: 0-No pain  Precautions/Restrictions     Exercise/Treatments Neurological Re-education Exercises Shoulder Flexion: AAROM;15 reps Shoulder ABduction: AAROM;15 reps Shoulder Protraction: AAROM;15 reps Shoulder Horizontal ABduction: AAROM;15 reps Shoulder External Rotation: AAROM;15 reps Shoulder Internal Rotation: AAROM;15 reps Elbow Flexion: AAROM;15 reps Elbow Extension: AAROM;15 reps Forearm Supination: AAROM;15 reps Forearm Pronation: AAROM;15 reps  Weight Bearing Exercises Weight Bearing Position: Standing Standing with weight shifting on and off: with some cueing at elbow  Development of Reach  Development of Reach: Reaching Reaching to Shoulder Height: in seated picking up blocks and reaching to shoulder height to place in ucket.  Grasp and Release Grasp and Release: Thumb Opposition (with blocks)   Occupational Therapy Assessment and Plan OT Assessment and Plan Clinical Impression Statement: A:  Patient had good pinch of blocks, he could not grip the SAEBO balls today.  Patient c/o volar wrist hurting mostly from having decreased ROM. OT Plan: P: Increase left wrist ROM to decrease pain with wt bearing.   Goals Short Term Goals Short Term Goal 1: Patient will be educated on HEP. Short Term Goal 2: Patient will increase LUE GMC from absent to poor for increased I with BADLs.  Short Term Goal 3: Patient will increase use of LUE as non-dominant with  functional activities by 25%.  Short Term Goal 4: Patient will increase left grip strength by 5 pounds for increased ability to hold containers. Short Term Goal 5: Patient will increase LUE AROM by 5 degrees for increase independence with BADL.  Long Term Goals Long Term Goal 1: Patient will increase dynamic standing balance to Good for increased independence with BADL. Long Term Goal 2: Patient will increase LUE strength to 3+/5.  Long Term Goal 3: Patient will increase LUE GMC from poor to fair. Long Term Goal 4: Patient will increase use of LUE as non-dominant with functional activities by 50%.  Problem List Patient Active Problem List  Diagnosis  . 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 HTN (hypertension), uncontrolled  . Leg weakness, bilateral  . Tobacco abuse  . Weight loss, non-intentional  . OSA (obstructive sleep apnea), history of, but with recent weight loss less symptoms  . NICM, EF 35% at cardiac cath, 55% by echo  . CVA (cerebral infarction)  . Difficulty in walking    End of Session Activity Tolerance: Patient tolerated treatment well General Behavior During Session: [facility] for tasks performed Cognition: [facility] for tasks performed  GO   Sulaiman Imbert L. Leron Stoffers, COTA/L  09/04/2012, 4:32 PM

## 2012-09-04 NOTE — Progress Notes (Addendum)
Physical Therapy Treatment Patient Details  Name: Glenn Maldonado MRN: [id number] Date of Birth: [birth year]  Today's Date: 09/04/2012 Time: 2440-1027 PT Time Calculation (min): 38 min Charges: 30' TA, 8' NMR  Visit#: 11  of 12   Re-eval: 09/30/12    Authorization:    Authorization Time Period:    Authorization Visit#:   of     Subjective: Symptoms/Limitations Symptoms: Pt reports that he is doing better today.  he comes in today with his wife.  She reports that she is fearful to go to work due to his increased fall risk and inability to get up if he falls on the ground.  He reports that he fell this past Sunday and required assistance to get up.   Precautions/Restrictions     Exercise/Treatments Mobility/Balance        Balance Exercises Standing Tandem Gait: 2 reps Retro Gait: 2 reps Other Standing Exercises: BSW TM x4 @ 1.2 mph minutes w/mod cueing for hip flexion and knee flexion.  Yoga Poses    Seated Other Seated Exercises: Fall recovery from half kneeling, tall kneeling, R and L S/L, supine and prone poisitiong.  Multiple attempts from Max A to Independent.  Best was with L foot in front in half kneeling and push up w/RUE.  Attempted from multiple heights of bed.  Discussed with patient and wife safe ways to practice at home and appropriate furniture to have around in order for him to independently stand when he falls. x30 minutes.   Supine       Physical Therapy Assessment and Plan PT Assessment and Plan Clinical Impression Statement: Treatment focus on TA for fall recovery.  Pt started to require max assistance and able to end independently from supine position to pushing up using RUE and leading with LLE.  Continues to have greatest difficulty with activity tolerance.   PT Plan: Contiue with BWS TM to improve endurance, gait activities to improve balance.      Goals    Problem List Patient Active Problem List  Diagnosis  . STE on ECG - not MI; non-obstructive  CAD on Cath, most likely related to HTN  . HTN (hypertension), uncontrolled  . Leg weakness, bilateral  . Tobacco abuse  . Weight loss, non-intentional  . OSA (obstructive sleep apnea), history of, but with recent weight loss less symptoms  . NICM, EF 35% at cardiac cath, 55% by echo  . CVA (cerebral infarction)  . Difficulty in walking    General Behavior During Session: [facility] for tasks performed Cognition: [facility] for tasks performed  Yevonne Yokum, PT 09/04/2012, 3:39 PM

## 2012-09-07 ENCOUNTER — Ambulatory Visit (HOSPITAL_COMMUNITY)
Admission: RE | Admit: 2012-09-07 | Discharge: 2012-09-07 | Disposition: A | Discharge status: Home / Self Care | Payer: Self-pay | Source: Ambulatory Visit | Attending: Physical Medicine & Rehabilitation | Admitting: Physical Medicine & Rehabilitation

## 2012-09-07 NOTE — Progress Notes (Addendum)
Physical Therapy Treatment Patient Details  Name: Glenn Maldonado MRN: [id number] Date of Birth: [birth year]  Today's Date: 09/07/2012 Time: 9147-8295 PT Time Calculation (min): 46 min Charges:  Gait 38' Visit#: 12  of 16   Re-eval: 09/30/12 Authorization: [place] Medicaid Pending    Subjective: Symptoms/Limitations Symptoms: Pt. states he's been having difficulty swallowing pills/food.  States he is concerned.  Referred to speak to our SLP.  Pt. reported no falls since last visit. Pain Assessment Currently in Pain?: No/denies Pain Score: 0-No pain   Exercise/Treatments Balance Exercises Standing Tandem Gait: 2 reps Retro Gait: 2 reps Sidestepping: 2 reps Gait trainer 5 minutes X 2 bouts @ 1.1-1. w/mod cueing for hip flexion and knee flexion.    Physical Therapy Assessment and Plan PT Assessment and Plan Clinical Impression Statement: Able to complete longer gait session today without fatigue, however requires frequent cues to increase stride and keep erect posture.    Minimal LOB with balance activities today.  Pt. c/o swallowing difficulties and spoke to Havery Moros, SLP. PT Plan: Contiue with BWS TM to improve endurance, gait activities to improve balance.     Problem List Patient Active Problem List  Diagnosis  . 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 HTN (hypertension), uncontrolled  . Leg weakness, bilateral  . Tobacco abuse  . Weight loss, non-intentional  . OSA (obstructive sleep apnea), history of, but with recent weight loss less symptoms  . NICM, EF 35% at cardiac cath, 55% by echo  . CVA (cerebral infarction)  . Difficulty in walking    PT - End of Session Equipment Utilized During Treatment: Gait belt Activity Tolerance: Patient tolerated treatment well General Behavior During Session: [facility] for tasks performed Cognition: [facility] for tasks performed   Lurena Nida, PTA/CLT 09/07/2012, 2:17 PM

## 2012-09-07 NOTE — Progress Notes (Signed)
Occupational Therapy Treatment Patient Details  Name: Glenn Maldonado MRN: [id number] Date of Birth: 11/19/1960  Today's Date: 09/07/2012 Time: 1355-1430 OT Time Calculation (min): 35 min Neuro re-ed 35'  Visit#: 12  of 24   Re-eval: 09/28/12    Authorization: Medicaid pending   Authorization Time Period:    Authorization Visit#:   of    Subjective Symptoms/Limitations Symptoms: S:  I have a pretty good weekend Pain Assessment Currently in Pain?: No/denies Pain Score: 0-No pain  Precautions/Restrictions     Exercise/Treatments Neurological Re-education Exercises Shoulder Flexion: AAROM;15 reps Shoulder ABduction: AAROM;15 reps Shoulder Protraction: AAROM;15 reps Shoulder Horizontal ABduction: AAROM;15 reps Shoulder External Rotation: AAROM;15 reps Shoulder Internal Rotation: AAROM;15 reps Elbow Flexion: AAROM;15 reps Elbow Extension: AAROM;15 reps Forearm Supination: AAROM;15 reps Forearm Pronation: AAROM;15 reps Wrist Flexion: AAROM;15 reps Wrist Extension: AAROM;15 reps  Weight Bearing Exercises Weight Bearing Position: Standing  Development of Reach  Development of Reach:  (putting blocks into container)      Fine Motor Coordination Opposition:  (pinching blocks to stack them )        Occupational Therapy Assessment and Plan OT Assessment and Plan Clinical Impression Statement: A:  Patient performed well with grasp and release and pinching blocks. Good ability to release and good control to stack blocks. OT Plan: P:  Increase fluid movement of left shoulder.   Goals Short Term Goals Short Term Goal 1: Patient will be educated on HEP. Short Term Goal 2: Patient will increase LUE GMC from absent to poor for increased I with BADLs.  Short Term Goal 3: Patient will increase use of LUE as non-dominant with functional activities by 25%.  Short Term Goal 4: Patient will increase left grip strength by 5 pounds for increased ability to hold containers. Short  Term Goal 5: Patient will increase LUE AROM by 5 degrees for increase independence with BADL.  Long Term Goals Long Term Goal 1: Patient will increase dynamic standing balance to Good for increased independence with BADL. Long Term Goal 2: Patient will increase LUE strength to 3+/5.  Long Term Goal 3: Patient will increase LUE GMC from poor to fair. Long Term Goal 4: Patient will increase use of LUE as non-dominant with functional activities by 50%.  Problem List Patient Active Problem List  Diagnosis  . 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 HTN (hypertension), uncontrolled  . Leg weakness, bilateral  . Tobacco abuse  . Weight loss, non-intentional  . OSA (obstructive sleep apnea), history of, but with recent weight loss less symptoms  . NICM, EF 35% at cardiac cath, 55% by echo  . CVA (cerebral infarction)  . Difficulty in walking    End of Session Activity Tolerance: Patient tolerated treatment well General Behavior During Session: [facility] for tasks performed Cognition: [facility] for tasks performed  GO   Celinda Dethlefs L. Finnean Cerami, COTA/L  09/07/2012, 5:46 PM

## 2012-09-11 ENCOUNTER — Ambulatory Visit (HOSPITAL_COMMUNITY)
Admission: RE | Admit: 2012-09-11 | Discharge: 2012-09-11 | Disposition: A | Discharge status: Home / Self Care | Payer: Self-pay | Source: Ambulatory Visit | Attending: Physical Medicine & Rehabilitation | Admitting: Physical Medicine & Rehabilitation

## 2012-09-11 NOTE — Progress Notes (Signed)
Occupational Therapy Treatment Patient Details  Name: Glenn Maldonado MRN: [id number] Date of Birth: 01-Oct-1961  Today's Date: 09/11/2012 Time: 0103-0147 OT Time Calculation (min): 44 min Neuro Re-ed 44'  Visit#: 13  of 24   Re-eval: 09/28/12 Assessment Diagnosis: R CVA  Authorization: medicaid pending  Authorization Time Period:    Authorization Visit#:   of    Subjective Symptoms/Limitations Symptoms: S:  I tried to cook and almost burned the house down.  Precautions/Restrictions  Precautions Precautions: Fall Precaution Comments: Left side hemiparesis  Exercise/Treatments Neurological Re-education Exercises Shoulder Flexion: AAROM;15 reps Shoulder ABduction: AAROM;15 reps Shoulder Protraction: AAROM;15 reps Shoulder Horizontal ABduction: AAROM;15 reps Shoulder External Rotation: AAROM;15 reps Shoulder Internal Rotation: AAROM;15 reps Elbow Flexion: AAROM;15 reps Elbow Extension: AAROM;15 reps Forearm Supination: AAROM;15 reps Forearm Pronation: AAROM;15 reps Wrist Flexion: AAROM;15 reps Wrist Extension: AAROM;15 reps  Development of Reach  Reaching to Shoulder Height: in seated while picking up and releasing ball  Grasp and Release Grasp and Release:  (with rough edge ball and SAEBO ball)  Occupational Therapy Assessment and Plan OT Assessment and Plan Clinical Impression Statement: A:  Patient was very frustrated today with his inability to grasp the ball like he wanted.  Encouraged patient to be rest as needed and decrease his frustration.  Discussed and picures given of devices to increase independence in the kitchen and with self feeding and cutting.  Recommend a universal cuff. OT Plan: P:  Complete cooking activity and assess for kitchen safety.   Goals Short Term Goals Short Term Goal 1: Patient will be educated on HEP. Short Term Goal 2: Patient will increase LUE GMC from absent to poor for increased I with BADLs.  Short Term Goal 3: Patient will  increase use of LUE as non-dominant with functional activities by 25%.  Short Term Goal 4: Patient will increase left grip strength by 5 pounds for increased ability to hold containers. Short Term Goal 5: Patient will increase LUE AROM by 5 degrees for increase independence with BADL.  Long Term Goals Long Term Goal 1: Patient will increase dynamic standing balance to Good for increased independence with BADL. Long Term Goal 2: Patient will increase LUE strength to 3+/5.  Long Term Goal 3: Patient will increase LUE GMC from poor to fair. Long Term Goal 4: Patient will increase use of LUE as non-dominant with functional activities by 50%.  Problem List Patient Active Problem List  Diagnosis  . 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 HTN (hypertension), uncontrolled  . Leg weakness, bilateral  . Tobacco abuse  . Weight loss, non-intentional  . OSA (obstructive sleep apnea), history of, but with recent weight loss less symptoms  . NICM, EF 35% at cardiac cath, 55% by echo  . CVA (cerebral infarction)  . Difficulty in walking    End of Session Activity Tolerance: Patient tolerated treatment well General Behavior During Session: [facility] for tasks performed Cognition: [facility] for tasks performed  GO    Noralee Stain, Lawerance Matsuo L 09/11/2012, 2:34 PM

## 2012-09-11 NOTE — Progress Notes (Signed)
Physical Therapy Treatment Patient Details  Name: Kitai Purdom MRN: [id number] Date of Birth: [birth year]  Today's Date: 09/11/2012 Time: 1400-1440 PT Time Calculation (min): 40 min Charges: 40' NMR Visit#: 13  of 16   Re-eval: 09/30/12   Subjective: Symptoms/Limitations Symptoms: Pt and pt wife report he is doing his exercises everyday to fatigue.  He continues to have decreae patience for allowing body to continue healing.  Pain Assessment Currently in Pain?: No/denies  Precautions/Restrictions  Precautions Precautions: Fall Precaution Comments: Left side hemiparesis  Exercise/Treatments Aerobic Tread Mill: BWS TM 1.1 mph 2x5' w/manual facilitation to improve L knee flexion and decrease genu recravatum.  Seated Long Arc Quad: Left;10 reps Other Seated Knee Exercises: SLR x10 Standing Gait with Head Turns (Round Trips): 6 RT w/ side to side and up and down Sidestepping: 3 reps Sit to Stand: Standard surface;Limitations Sit to Stand Limitations: 5 reps  Seated Dynamic Sitting: Eyes opened;Upper extremity support - 2;Lower extremity support - 2;Limitations Dynamic Sitting Time: forward, up, L side, R side, L diagnoal and R diagnoal reach x5 each direction w/R and L hand w/L UE WB   Physical Therapy Assessment and Plan PT Assessment and Plan Clinical Impression Statement: Treatment focused on improving activity tolerance and improving sitting and standing dynamic balance.  pt continue to require min guard A while in therapy due to decreased energy, however at home is indepenent with his household activities.  PT Plan: Re-eval before MD apt next week.     Goals    Problem List Patient Active Problem List  Diagnosis  . 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 HTN (hypertension), uncontrolled  . Leg weakness, bilateral  . Tobacco abuse  . Weight loss, non-intentional  . OSA (obstructive sleep apnea), history of, but with recent  weight loss less symptoms  . NICM, EF 35% at cardiac cath, 55% by echo  . CVA (cerebral infarction)  . Difficulty in walking    PT - End of Session Activity Tolerance: Patient limited by fatigue General Behavior During Session: [facility] for tasks performed Cognition: [facility] for tasks performed  GP    Linnae Rasool 09/11/2012, 2:57 PM

## 2012-09-14 ENCOUNTER — Ambulatory Visit (HOSPITAL_COMMUNITY)
Admission: RE | Admit: 2012-09-14 | Discharge: 2012-09-14 | Disposition: A | Discharge status: Home / Self Care | Payer: Self-pay | Source: Ambulatory Visit | Attending: Physical Medicine & Rehabilitation | Admitting: Physical Medicine & Rehabilitation

## 2012-09-14 NOTE — Progress Notes (Signed)
Occupational Therapy Treatment Patient Details  Name: Glenn Maldonado MRN: [id number] Date of Birth: 29-Sep-1961  Today's Date: 09/14/2012 Time: 4098-1191 OT Time Calculation (min): 42 min Neuro re-ed 42'  Visit#: 14  of 24   Re-eval: 09/28/12 Assessment Diagnosis: R CVA  Authorization: medicaid pending   Authorization Time Period:    Authorization Visit#:   of    Subjective Symptoms/Limitations Symptoms: S:  I cooked again this weekend and did ok.  Precautions/Restrictions  Precautions Precautions: Fall  Exercise/Treatments Neurological Re-education Weight Bearing Exercises Weight Bearing Position: Seated Seated with weight on hand: with min facilitation to maintain hand position. x5 reps; educated on weightbearing at home.  Development of Reach  Development of Reach: Sa[place]o Hand over Hand Assistance while Reaching: using SAEBO using 5 hole and hand over hand assist.  Movement much more fluid.  Grasp and Release Grasp and Release:  (with SAEBO 5 hole)     Weight Bearing Technique Weight Bearing Technique: Yes LUE Weight Bearing Technique: Extended arm seated Activities of Daily Living Activities of Daily Living: Completed cooking task of making muffins.  Occupational Therapy Assessment and Plan OT Assessment and Plan Clinical Impression Statement: A:  Completed cooking task, patient very safe and used affected arm   I'ly as gross assist in prep of food and reacing for items and holding bowl. Movement much more fluid today. OT Plan: P:  Attempt to increase gross grasp.   Goals Home Exercise Program Pt will Perform Home Exercise Program: Independently  Problem List Patient Active Problem List  Diagnosis  . 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 HTN (hypertension), uncontrolled  . Leg weakness, bilateral  . Tobacco abuse  . Weight loss, non-intentional  . OSA (obstructive sleep apnea), history of, but with recent weight  loss less symptoms  . NICM, EF 35% at cardiac cath, 55% by echo  . CVA (cerebral infarction)  . Difficulty in walking    End of Session Activity Tolerance: Patient tolerated treatment well General Behavior During Session: [facility] for tasks performed Cognition: [facility] for tasks performed  GO    Noralee Stain, Vieno Tarrant L 09/14/2012, 5:31 PM

## 2012-09-18 ENCOUNTER — Ambulatory Visit (HOSPITAL_BASED_OUTPATIENT_CLINIC_OR_DEPARTMENT_OTHER): Payer: Self-pay | Admitting: Physical Medicine & Rehabilitation

## 2012-09-18 ENCOUNTER — Ambulatory Visit (HOSPITAL_COMMUNITY)
Admission: RE | Admit: 2012-09-18 | Discharge: 2012-09-18 | Disposition: A | Discharge status: Home / Self Care | Payer: Self-pay | Source: Ambulatory Visit | Attending: Physical Medicine & Rehabilitation | Admitting: Physical Medicine & Rehabilitation

## 2012-09-18 ENCOUNTER — Encounter: Payer: Self-pay | Admitting: Physical Medicine & Rehabilitation

## 2012-09-18 ENCOUNTER — Ambulatory Visit: Payer: Self-pay | Admitting: Physical Medicine & Rehabilitation

## 2012-09-18 VITALS — BP 116/66 | HR 73 | Resp 14 | Ht 70.0 in | Wt 173.4 lb

## 2012-09-18 DIAGNOSIS — R209 Unspecified disturbances of skin sensation: Secondary | ICD-10-CM | POA: Insufficient documentation

## 2012-09-18 DIAGNOSIS — I69959 Hemiplegia and hemiparesis following unspecified cerebrovascular disease affecting unspecified side: Secondary | ICD-10-CM | POA: Insufficient documentation

## 2012-09-18 DIAGNOSIS — I69919 Unspecified symptoms and signs involving cognitive functions following unspecified cerebrovascular disease: Secondary | ICD-10-CM | POA: Insufficient documentation

## 2012-09-18 DIAGNOSIS — I6350 Cerebral infarction due to unspecified occlusion or stenosis of unspecified cerebral artery: Secondary | ICD-10-CM

## 2012-09-18 DIAGNOSIS — M549 Dorsalgia, unspecified: Secondary | ICD-10-CM | POA: Insufficient documentation

## 2012-09-18 DIAGNOSIS — R5381 Other malaise: Secondary | ICD-10-CM | POA: Insufficient documentation

## 2012-09-18 DIAGNOSIS — G8110 Spastic hemiplegia affecting unspecified side: Secondary | ICD-10-CM

## 2012-09-18 DIAGNOSIS — T753XXA Motion sickness, initial encounter: Secondary | ICD-10-CM | POA: Insufficient documentation

## 2012-09-18 DIAGNOSIS — I639 Cerebral infarction, unspecified: Secondary | ICD-10-CM

## 2012-09-18 DIAGNOSIS — R269 Unspecified abnormalities of gait and mobility: Secondary | ICD-10-CM | POA: Insufficient documentation

## 2012-09-18 DIAGNOSIS — R5383 Other fatigue: Secondary | ICD-10-CM | POA: Insufficient documentation

## 2012-09-18 MED ORDER — BUPROPION HCL 75 MG PO TABS: 75.0000 mg | ORAL_TABLET | Freq: Two times a day (BID) | ORAL | Status: DC

## 2012-09-18 MED ORDER — PREDNISONE (PAK) 10 MG PO TABS: 10.0000 mg | ORAL_TABLET | Freq: Every day | ORAL | Status: DC

## 2012-09-18 NOTE — Progress Notes (Signed)
Physical Therapy Treatment Patient Details  Name: Glenn Maldonado MRN: [id number] Date of Birth: 11/18/1960  Today's Date: 09/18/2012 Time: 1520-1558 PT Time Calculation (min): 38 min  Visit#: 14  of 16   Re-eval: 09/30/12    Authorization:   pending [place] medicaid Subjective: Symptoms/Limitations Symptoms: Pt had MD appointments this AM and is very tired.   Exercise/Treatments     Balance Exercises Standing Tandem Gait: 2 reps Retro Gait: 2 reps Sidestepping: 1 rep;Limitations Sidestepping Limitations: w/squat Numbers 1-15: Foam;1 rep Marching: Solid surface;10 reps;Limitations Marching Limitations: hold 5 sec  Sit to Stand: Standard surface Sit to Stand Limitations: 10 Other Standing Exercises: lunge walking x 1 rt./ vector stance B leg stance.        Physical Therapy Assessment and Plan PT Assessment and Plan Clinical Impression Statement: treatment focused on balance and functional strength.  Pt fatigued due to having MD appointment in AM.  Pt Lost balance multiple times throughout treatment time today.   PT Plan: primary therapist to re-evaluate.  Pt showed increased ability to complete higher level balance activity may add cone rotation on foam    Goals    Problem List Patient Active Problem List  Diagnosis  . 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 HTN (hypertension), uncontrolled  . Leg weakness, bilateral  . Tobacco abuse  . Weight loss, non-intentional  . OSA (obstructive sleep apnea), history of, but with recent weight loss less symptoms  . NICM, EF 35% at cardiac cath, 55% by echo  . CVA (cerebral infarction)  . Difficulty in walking    PT - End of Session Activity Tolerance: Patient limited by fatigue  GP    RUSSELL,CINDY 09/18/2012, 4:08 PM

## 2012-09-18 NOTE — Progress Notes (Signed)
Subjective:    Patient ID: Glenn Maldonado, male    DOB: 11-Mar-1961, 51 y.o.   MRN: [id number] [name] is a 51 year old right-  handed male with history of hypertension, hep C, who was admitted on  June 28, 2012, with complaints of chest pressure and difficulty  walking with left arm heaviness and tingling sensation. BP was noted to  be elevated at 178/118 and EKG showed ST changes. The patient was  started on IV heparin. CT of head then showed no acute changes. He  underwent cardiac cath revealing global hypokinesis with EF of 40-45%  and nonobstructive coronary artery disease  HPI Fell while kneeling, yesterday no injury L hand swelling, L hand pain,  Pain Inventory Average Pain 4 Pain Right Now 2 My pain is burning and tingling  In the last 24 hours, has pain interfered with the following? General activity 6 Relation with others 8 Enjoyment of life 0 What TIME of day is your pain at its worst? morning Sleep (in general) Poor  Pain is worse with: sitting, standing and some activites Pain improves with: heat/ice and medication Relief from Meds: 4  Mobility walk with assistance use a cane do you drive?  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 Wife states that she had to take all the guns out of the house because of his talking about it. They have been given a name of some place to seek help in [facility], but every time they go there the lobby is full and by the time they get to him they tell them that they are "done with that for the day and cannot see him"  Prior  Studies Any changes since last visit?  no  Physicians involved in your care Any changes since last visit?  no   Family History  Problem Relation Age of Onset  . Coronary artery disease Mother   . Peripheral vascular disease Father   . HIV Brother    History   Social History  . Marital Status: Married    Spouse Name: N/A    Number of Children: N/A  . Years of Education: N/A   Social History Main Topics  . Smoking status: Former Games developer  . Smokeless tobacco: Never Used  . Alcohol Use: No     Comment: stopped in 2001  . Drug Use: No  . Sexually Active: None   Other Topics Concern  . None   Social History Narrative  . None   History reviewed. No pertinent past surgical history. Past Medical History  Diagnosis Date  . Hypertension   . Borderline diabetic   . STEMI (ST elevation myocardial infarction) 06/28/2012  . HTN (hypertension), uncontrolled 06/28/2012  . Leg weakness, bilateral 06/28/2012  . Tobacco abuse 06/28/2012  . Weight loss, non-intentional 06/28/2012  . OSA (obstructive sleep apnea), history of, but with recent weight loss less symptoms 06/28/2012  . Hepatitis C 2001    treated with interferon  . NICM (nonischemic cardiomyopathy), EF 35% at cardiac cath 06/28/2012  . Stroke    BP 116/66  Pulse 73  Resp 14  Ht 5\' 10"  (1.778 m)  Wt  173 lb 6.4 oz (78.654 kg)  BMI 24.88 kg/m2  SpO2 98%    Review of Systems  Constitutional: Positive for unexpected weight change.  Genitourinary:       Bladder control problems  Musculoskeletal:       Spasms  Neurological: Positive for dizziness, weakness and numbness.       Tingling  Psychiatric/Behavioral: Positive for suicidal ideas, confusion, sleep disturbance and dysphoric mood. The patient is nervous/anxious.        Poor sleep/  States he does not have a plan for suicide  All other systems reviewed and are negative.       Objective:   Physical Exam  Constitutional: He is oriented to person, place, and  time. He appears well-developed and well-nourished.  HENT:  Head: Normocephalic and atraumatic.  Eyes: Conjunctivae normal are normal. Pupils are equal, round, and reactive to light.  Neurological: He is alert and oriented to person, place, and time. He displays abnormal reflex. No sensory deficit. He exhibits abnormal muscle tone. Coordination and gait abnormal.  Reflex Scores:  Tricep reflexes are 2+ on the right side and 3+ on the left side.  Bicep reflexes are 2+ on the right side and 3+ on the left side.  Brachioradialis reflexes are 2+ on the right side and 3+ on the left side.  Patellar reflexes are 2+ on the right side and 3+ on the left side.  Achilles reflexes are 2+ on the right side and 3+ on the left side. 3/5 on Left side Delt bi, tri  3-/5 Left grip  4-/5 Quad. 3-/5 HF 3-/5 PF and DF  Skin:  L pretibial papule  Psychiatric: He has a normal mood and affect.  No pain with MCP compression Dorsum of the hand has swelling on the left side only no evidence of erythema no evidence of open lesions. Normal radial pulse Range of motion reduced in the finger flexors and wrist flexors on the left side with pain during passive extension One half finger breaths subluxed on the left shoulder no pain with range of motion  Ambulates without assisted device. Has hyperextension at the knee and mild toe drag on the left side. He did not wear his brace today however Visual fields intact to confrontation testing No evidence of tactile neglect Sensation identifies which fingers touched on the left     Assessment & Plan:  1. Right MCA infarct improving motor strength, exceeded expectations in terms of mobility.Is regained a half grade of strength in the proximal left upper extremity and full grade in the left wrist and finger flexors Continue outpatient therapy We discussed that therapy is not on going over the course of years but rather weeks or months. We discussed prognosis for further  improvement 2. 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 Will start on a prednisone dose pack 3. 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 He has been treated for depression with Wellbutrin in the past and has tolerated this. Will start this for him. If he experiences increased depression he's been instructed to stop this medication

## 2012-09-18 NOTE — Patient Instructions (Signed)
You're having a very good recovery thus far Once therapy stops she'll need to continue her home exercise program and use the left arm as much as possible and walk on a regular basis see you in 2 months

## 2012-09-21 ENCOUNTER — Ambulatory Visit (HOSPITAL_COMMUNITY)
Admission: RE | Admit: 2012-09-21 | Discharge: 2012-09-21 | Disposition: A | Discharge status: Home / Self Care | Payer: Self-pay | Source: Ambulatory Visit | Attending: Physical Medicine & Rehabilitation | Admitting: Physical Medicine & Rehabilitation

## 2012-09-21 NOTE — Evaluation (Signed)
Physical Therapy Discharge  Patient Details  Name: Glenn Maldonado MRN: [id number] Date of Birth: Aug 23, 1961  Today's Date: 09/21/2012 Time: 1520-1558 PT Time Calculation (min): 38 min Charges: 1 MMT, 25' PPT, 10' Gait Visit#: 15  of 16   Re-eval:   Assessment Diagnosis: L sided weakness s/p R CVA Next MD Visit: Dr. Wynn Banker 11/19/11 Prior Therapy: Inpatient rehab at [facility]  Subjective Symptoms/Limitations Symptoms: Pt reports that he is doing pretty well and that he realizes that he has changed.  He states that he MD apt went really well.   Pain Assessment Currently in Pain?: No/denies  Precautions/Restrictions  Precautions Precautions: Fall  LLE Overall Strength Comments: hip flexion 4/5 (was 3/5); knee extension 5/5 (was 4/5); knee flexion 5/5 (was 4-/5); ankle DF 3+/5 (was 1/5)  Exercise/Treatments Mobility/Balance  Ambulation/Gait Ambulation/Gait Assistance: 4: Min assist;3: Mod assist  Berg Balance Test : 48/56 Sit to Stand: Able to stand without using hands and stabilize independently Standing Unsupported: Able to stand safely 2 minutes Sitting with Back Unsupported but Feet Supported on Floor or Stool: Able to sit safely and securely 2 minutes Stand to Sit: Sits safely with minimal use of hands Transfers: Able to transfer safely, minor use of hands Standing Unsupported with Eyes Closed: Able to stand 10 seconds safely Standing Ubsupported with Feet Together: Able to place feet together independently and stand 1 minute safely From Standing, Reach Forward with Outstretched Arm: Can reach confidently >25 cm (10") From Standing Position, Pick up Object from Floor: Able to pick up shoe safely and easily From Standing Position, Turn to Look Behind Over each Shoulder: Looks behind from both sides and weight shifts well Turn 360 Degrees: Able to turn 360 degrees safely one side only in 4 seconds or less Standing Unsupported, Alternately Place Feet on Step/Stool: Able to  complete >2 steps/needs minimal assist Standing Unsupported, One Foot in Front: Able to plae foot ahead of the other independently and hold 30 seconds Standing on One Leg: Tries to lift leg/unable to hold 3 seconds but remains standing independently  Dynamic Gait Index: 8/26 Level Surface: Moderate Impairment Change in Gait Speed: Moderate Impairment Gait with Horizontal Head Turns: Moderate Impairment Gait with Vertical Head Turns: Moderate Impairment Gait and Pivot Turn: Moderate Impairment Step Over Obstacle: Moderate Impairment Step Around Obstacles: Moderate Impairment Steps: Moderate Impairment  Timed Up and Go Test TUG: Normal TUG Normal TUG (seconds): 13 sec (CGA)   Standing Other Standing Knee Exercises: w/grocery cart in hallways x8 min 30 sec   Physical Therapy Assessment and Plan PT Assessment and Plan PT Plan: D/C    Goals Pt will Perform Home Exercise Program: Independently: Met PT Short Term Goals: 4 weeks PT Short Term Goal 1: Pt will ambulate with S w/quad cane: Met PT Short Term Goal 2: Pt will demonstrate Tandem stance x10 sec on static surface.: Met PT Short Term Goal 3: Pt will complete Berg Balance test and DGI.: Met PT Short Term Goal 4: Pt will improve TUG to less than 13 sec w/S and LRAD for safe community ambulation.: Met (w/CGA) PT Long Term Goals 8 weeks PT Long Term Goal 1: Pt will improve his LE strength to WNL in order to tolerate ambulating x15 minutes w/LRAD in order to return to community ambulation: Progressing toward goal (able to walk 8 minutes ) PT Long Term Goal 2: Pt will improve his dynamic balance and demonstrate ambulating on grass surfaces w/LRAD and S in order to return to outdoor activities.: Met (walking  dog outdoors) Long Term Goal 3: Pt will score a 50/56 on Berg and 18/24 on DGI for safe community ambulation.: Not met Sharlene Motts: 48/56, DGI: 8/24) Long Term Goal 4: Pt will improve gait speed to 3 feet/sec for normaltive values for age  and ambulate 360 ft in 2 minutes: Met  Problem List Patient Active Problem List  Diagnosis  . 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 HTN (hypertension), uncontrolled  . Leg weakness, bilateral  . Tobacco abuse  . Weight loss, non-intentional  . OSA (obstructive sleep apnea), history of, but with recent weight loss less symptoms  . NICM, EF 35% at cardiac cath, 55% by echo  . CVA (cerebral infarction)  . Difficulty in walking       GP    Bradan Congrove 09/21/2012, 5:39 PM  Physician Documentation Your signature is required to indicate approval of the treatment plan as stated above.  Please sign and either send electronically or make a copy of this report for your files and return this physician signed original.   Please mark one 1.__approve of plan  2. ___approve of plan with the following conditions.   ______________________________                                                          _____________________ Physician Signature                                                                                                             Date

## 2012-09-21 NOTE — Progress Notes (Signed)
Occupational Therapy Treatment Patient Details  Name: Regis Hinton MRN: [id number] Date of Birth: Jan 04, 1961  Today's Date: 09/21/2012 Time: 0102-7253 OT Time Calculation (min): 40 min  Visit#: 15  of 24   Re-eval: 09/28/12 Assessment Diagnosis: R CVA  Authorization: medicaid pending   Authorization Time Period:    Authorization Visit#:   of    Subjective Symptoms/Limitations Symptoms: S:  Look what I can do with my arm, I want to start using it to work. Pain Assessment Currently in Pain?: No/denies  Precautions/Restrictions  Precautions Precautions: Fall Precaution Comments: Left side hemiparesis  Exercise/Treatments Wrist Exercises Forearm Supination: AROM;15 reps Forearm Pronation: AROM;15 reps Wrist Flexion: AROM;15 reps Bar Weights/Barbell (Wrist Flexion): 1 lb Wrist Extension: AROM;15 reps   Sponges: picking up one at a time  Neurological Re-education Exercises Forearm Supination: AROM;15 reps Forearm Pronation: AROM;15 reps Wrist Flexion: AROM;15 reps Bar Weights/Barbell (Wrist Flexion): 1 lb Wrist Extension: AROM;15 reps Sponges: picking up one at a time     Fine Motor Coordination Fine Motor Coordination: Digit abduction        Occupational Therapy Assessment and Plan OT Assessment and Plan Clinical Impression Statement: A:  Patient demonstrated full flexion of left hand and holding in flexion.  Transistioned today to fine motor and wrist AROM.  Patient limited with extension secondary to tightness in his wrist. OT Plan: P:  Continue to increase wrist and forearm strength.   Goals Home Exercise Program Pt will Perform Home Exercise Program: Independently PT Goal: Perform Home Exercise Program - Progress: Met  Problem List Patient Active Problem List  Diagnosis  . 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 HTN (hypertension), uncontrolled  . Leg weakness, bilateral  . Tobacco abuse  . Weight loss,  non-intentional  . OSA (obstructive sleep apnea), history of, but with recent weight loss less symptoms  . NICM, EF 35% at cardiac cath, 55% by echo  . CVA (cerebral infarction)  . Difficulty in walking    End of Session Activity Tolerance: Patient tolerated treatment well General Behavior During Session: [facility] for tasks performed Cognition: [facility] for tasks performed  GO    Noralee Stain, [facility] 09/21/2012, 5:48 PM

## 2012-09-25 ENCOUNTER — Ambulatory Visit (HOSPITAL_COMMUNITY): Payer: Self-pay

## 2012-09-28 ENCOUNTER — Ambulatory Visit (HOSPITAL_COMMUNITY): Payer: Self-pay | Admitting: Specialist

## 2012-09-28 ENCOUNTER — Ambulatory Visit (HOSPITAL_COMMUNITY)
Admission: RE | Admit: 2012-09-28 | Discharge: 2012-09-28 | Disposition: A | Discharge status: Home / Self Care | Payer: Self-pay | Source: Ambulatory Visit | Attending: Physical Medicine & Rehabilitation | Admitting: Physical Medicine & Rehabilitation

## 2012-09-28 NOTE — Progress Notes (Signed)
Occupational Therapy Treatment Patient Details  Name: Glenn Maldonado MRN: [id number] Date of Birth: 1961/01/10  Today's Date: 09/28/2012 Time: 1400-1440 OT Time Calculation (min): 40 min Reassess 30' HEP instruction 10' Visit#: 16  of 24   Re-eval: 09/28/12    Authorization: medicaid pending   Subjective S:  I have been really working my arm, I have been splitting wood, rewiring lamps, etc. Pain Assessment Currently in Pain?: No/denies Pain Score: 0-No pain  Precautions/Restrictions   N/A  Exercise/Treatments    Splinting Splinting: Patient donned resting hand splint today and requested that the digits have increased extension position.  I educated the patient on the reasoning behind a functional position in the digits and left splint as is. Activities of Daily Living Activities of Daily Living: Instructed patient to continue to participate in ADLs using his right arm as much as possible.  I educated him on use of foam blocks to practice [employer], GMC, in hand manipulation activities.    Occupational Therapy Assessment and Plan OT Assessment and Plan Clinical Impression Statement: A:  Please refer to dc summary in letter portion of this chart.  Patient has had significant improvements in shoulder and elbow strength, functional use of his hand, and his active grasp.  He has had 0 improvement with releasing objects and wrist extension.  Traveling to therapy is becoming a financial burden to the patient . Therefore, we have decided to transition to a HEP.   OT Plan: P:  DC with a HEP.   Goals Short Term Goals Short Term Goal 1: Patient will be educated on HEP. Short Term Goal 1 Progress: Met Short Term Goal 2: Patient will increase LUE GMC from absent to poor for increased I with BADLs.  Short Term Goal 2 Progress: Met Short Term Goal 3: Patient will increase use of LUE as non-dominant with functional activities by 25%.  Short Term Goal 3 Progress: Met Short Term Goal 4: Patient  will increase left grip strength by 5 pounds for increased ability to hold containers. Short Term Goal 4 Progress: Met Short Term Goal 5: Patient will increase LUE AROM by 5 degrees for increase independence with BADL.  Short Term Goal 5 Progress: Met Long Term Goals Long Term Goal 1: Patient will increase dynamic standing balance to Good for increased independence with BADL. Long Term Goal 1 Progress: Met Long Term Goal 2: Patient will increase LUE strength to 3+/5.  Long Term Goal 2 Progress: Met Long Term Goal 3: Patient will increase LUE GMC from poor to fair. Long Term Goal 3 Progress: Met Long Term Goal 4: Patient will increase use of LUE as non-dominant with functional activities by 50%. Long Term Goal 4 Progress: Met  Problem List Patient Active Problem List  Diagnosis  . 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 HTN (hypertension), uncontrolled  . Leg weakness, bilateral  . Tobacco abuse  . Weight loss, non-intentional  . OSA (obstructive sleep apnea), history of, but with recent weight loss less symptoms  . NICM, EF 35% at cardiac cath, 55% by echo  . CVA (cerebral infarction)  . Difficulty in walking    End of Session Activity Tolerance: Patient tolerated treatment well General Behavior During Session: [facility] for tasks performed Cognition: [facility] for tasks performed OT Plan of Care OT Home Exercise Plan: functional use of left arm, splint use, GMC, FMC, in hand manipulation.  I encouraged patient to contact this therapist when he  is in need of an update for his HEP. Consulted and Agree with Plan of Care: Patient  Shirlean Mylar, OTR/L   09/28/2012, 4:49 PM

## 2012-09-30 ENCOUNTER — Ambulatory Visit (HOSPITAL_COMMUNITY): Payer: Self-pay | Admitting: Physical Therapy

## 2012-09-30 ENCOUNTER — Ambulatory Visit (HOSPITAL_COMMUNITY): Payer: Self-pay | Admitting: Specialist

## 2012-10-05 ENCOUNTER — Ambulatory Visit (HOSPITAL_COMMUNITY): Payer: Self-pay | Admitting: Occupational Therapy

## 2012-10-09 ENCOUNTER — Ambulatory Visit (HOSPITAL_COMMUNITY): Payer: Self-pay | Admitting: Occupational Therapy

## 2012-11-19 ENCOUNTER — Ambulatory Visit: Payer: Self-pay | Admitting: Physical Medicine & Rehabilitation

## 2012-11-30 ENCOUNTER — Encounter: Payer: Self-pay | Attending: Physical Medicine & Rehabilitation

## 2012-11-30 ENCOUNTER — Encounter: Payer: Self-pay | Admitting: Physical Medicine & Rehabilitation

## 2012-11-30 ENCOUNTER — Ambulatory Visit (HOSPITAL_BASED_OUTPATIENT_CLINIC_OR_DEPARTMENT_OTHER): Payer: Self-pay | Admitting: Physical Medicine & Rehabilitation

## 2012-11-30 VITALS — BP 119/71 | HR 71 | Resp 14 | Ht 70.0 in | Wt 177.0 lb

## 2012-11-30 DIAGNOSIS — Z8673 Personal history of transient ischemic attack (TIA), and cerebral infarction without residual deficits: Secondary | ICD-10-CM

## 2012-11-30 DIAGNOSIS — G8110 Spastic hemiplegia affecting unspecified side: Secondary | ICD-10-CM

## 2012-11-30 DIAGNOSIS — T753XXA Motion sickness, initial encounter: Secondary | ICD-10-CM | POA: Insufficient documentation

## 2012-11-30 DIAGNOSIS — R269 Unspecified abnormalities of gait and mobility: Secondary | ICD-10-CM | POA: Insufficient documentation

## 2012-11-30 DIAGNOSIS — G8114 Spastic hemiplegia affecting left nondominant side: Secondary | ICD-10-CM

## 2012-11-30 DIAGNOSIS — R209 Unspecified disturbances of skin sensation: Secondary | ICD-10-CM | POA: Insufficient documentation

## 2012-11-30 DIAGNOSIS — M549 Dorsalgia, unspecified: Secondary | ICD-10-CM | POA: Insufficient documentation

## 2012-11-30 DIAGNOSIS — I69959 Hemiplegia and hemiparesis following unspecified cerebrovascular disease affecting unspecified side: Secondary | ICD-10-CM | POA: Insufficient documentation

## 2012-11-30 DIAGNOSIS — I69919 Unspecified symptoms and signs involving cognitive functions following unspecified cerebrovascular disease: Secondary | ICD-10-CM | POA: Insufficient documentation

## 2012-11-30 DIAGNOSIS — R5381 Other malaise: Secondary | ICD-10-CM | POA: Insufficient documentation

## 2012-11-30 MED ORDER — BACLOFEN 10 MG PO TABS: 10.0000 mg | ORAL_TABLET | Freq: Three times a day (TID) | ORAL | Status: DC

## 2012-11-30 NOTE — Progress Notes (Signed)
Subjective:    Patient ID: Glenn Maldonado, male    DOB: [birth year], 52 y.o.   MRN: [id number]  HPI Sees Psychiatrist for suicidal thoughts Problems with memory and attention Poor endurance  Pain Inventory Average Pain 6 Pain Right Now 6 My pain is sharp  In the last 24 hours, has pain interfered with the following? General activity 4 Relation with others 6 Enjoyment of life 9 What TIME of day is your pain at its worst? all the time Sleep (in general) Fair  Pain is worse with: some activites Pain improves with: medication Relief from Meds: 4  Mobility walk with assistance use a cane how many minutes can you walk? 10 ability to climb steps?  yes do you drive?  no needs help with transfers  Function disabled: date disabled 08/13 I need assistance with the following:  feeding, dressing, bathing, toileting and meal prep  Neuro/Psych bladder control problems bowel control problems No problems in this area weakness numbness tremor tingling trouble walking spasms dizziness confusion depression anxiety loss of taste or smell suicidal thoughts-he is upset because he cannot provide for his family.  His wife has removed all firearms from the house because of these thoughts.  Prior Studies Any changes since last visit?  no  Physicians involved in your care Any changes since last visit?  no Dr Carola Frost   Family History  Problem Relation Age of Onset  . Coronary artery disease Mother   . Peripheral vascular disease Father   . HIV Brother    History   Social History  . Marital Status: Married    Spouse Name: N/A    Number of Children: N/A  . Years of Education: N/A   Social History Main Topics  . Smoking status: Former Games developer  . Smokeless tobacco: Never Used  . Alcohol Use: No     Comment: stopped in 2001  . Drug Use: No  . Sexually Active: None   Other Topics Concern  . None   Social History Narrative  . None   History reviewed. No pertinent  past surgical history. Past Medical History  Diagnosis Date  . Hypertension   . Borderline diabetic   . STEMI (ST elevation myocardial infarction) 06/28/2012  . HTN (hypertension), uncontrolled 06/28/2012  . Leg weakness, bilateral 06/28/2012  . Tobacco abuse 06/28/2012  . Weight loss, non-intentional 06/28/2012  . OSA (obstructive sleep apnea), history of, but with recent weight loss less symptoms 06/28/2012  . Hepatitis C 2001    treated with interferon  . NICM (nonischemic cardiomyopathy), EF 35% at cardiac cath 06/28/2012  . Stroke    BP 119/71  Pulse 71  Resp 14  Ht 5\' 10"  (1.778 m)  Wt 177 lb (80.287 kg)  BMI 25.40 kg/m2  SpO2 99%     Review of Systems  Constitutional: Positive for unexpected weight change.  Respiratory: Positive for shortness of breath and wheezing.   Gastrointestinal: Positive for constipation.  Neurological: Positive for dizziness, tremors, weakness and numbness.  Psychiatric/Behavioral: Positive for suicidal ideas, confusion and dysphoric mood. The patient is nervous/anxious.   All other systems reviewed and are negative.       Objective:   Physical Exam  Nursing note and vitals reviewed. Constitutional: He is oriented to person, place, and time. He appears well-developed and well-nourished.  HENT:  Head: Normocephalic and atraumatic.  Eyes: Conjunctivae normal are normal. Pupils are equal, round, and reactive to light.  Neck: Normal range of motion.  Musculoskeletal:  Left wrist: He exhibits decreased range of motion and tenderness. He exhibits no bony tenderness.       Pain with wrist extension   Neurological: He is alert and oriented to person, place, and time. He has normal reflexes.  Psychiatric: His speech is slurred. He is slowed and withdrawn. He exhibits a depressed mood.    Tricep reflexes are 2+ on the right side and 3+ on the left side.  Bicep reflexes are 2+ on the right side and 3+ on the left side.  Brachioradialis  reflexes are 2+ on the right side and 3+ on the left side.  Patellar reflexes are 2+ on the right side and 3+ on the left side.  Achilles reflexes are 2+ on the right side and 3+ on the left side. 3/5 on Left side Delt bi, tri  3-/5 Left grip  4-/5 Quad. 3-/5 HF 3-/5 PF and DF  Skin:         Assessment & Plan:  1. Right MCA infarct improving motor strength, exceeded expectations in terms of mobility.Is regained a half grade of strength in the proximal left upper extremity and full grade in the left wrist and finger flexors,But has now plateaued in function. I do feel his cognitive recovery will proceed over the course of one year from the stroke. 2.Spasticity will increase baclofen 4 times a day 3. 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 He has been treated for depression with Wellbutrin in the past and has tolerated this.Agree with continued psychiatry followup. Wife has been providing supervision. She is removed guns from the home. His psychiatrist is aware of the patient's history of suicidal ideation.

## 2012-11-30 NOTE — Patient Instructions (Addendum)
Cont Wellbutrin See Psychiatrist, recommend Supervision Recommend PT,OT

## 2013-01-25 ENCOUNTER — Encounter: Payer: Self-pay | Admitting: Physical Medicine & Rehabilitation

## 2013-01-25 ENCOUNTER — Ambulatory Visit (HOSPITAL_BASED_OUTPATIENT_CLINIC_OR_DEPARTMENT_OTHER): Payer: Self-pay | Admitting: Physical Medicine & Rehabilitation

## 2013-01-25 ENCOUNTER — Encounter: Payer: Medicaid - Out of State | Attending: Physical Medicine & Rehabilitation

## 2013-01-25 VITALS — BP 134/77 | HR 67 | Resp 14 | Ht 70.0 in | Wt 175.6 lb

## 2013-01-25 DIAGNOSIS — M549 Dorsalgia, unspecified: Secondary | ICD-10-CM

## 2013-01-25 DIAGNOSIS — R5383 Other fatigue: Secondary | ICD-10-CM | POA: Insufficient documentation

## 2013-01-25 DIAGNOSIS — M546 Pain in thoracic spine: Secondary | ICD-10-CM

## 2013-01-25 DIAGNOSIS — T753XXA Motion sickness, initial encounter: Secondary | ICD-10-CM | POA: Insufficient documentation

## 2013-01-25 DIAGNOSIS — R5381 Other malaise: Secondary | ICD-10-CM | POA: Insufficient documentation

## 2013-01-25 DIAGNOSIS — R269 Unspecified abnormalities of gait and mobility: Secondary | ICD-10-CM | POA: Insufficient documentation

## 2013-01-25 DIAGNOSIS — I69959 Hemiplegia and hemiparesis following unspecified cerebrovascular disease affecting unspecified side: Secondary | ICD-10-CM | POA: Insufficient documentation

## 2013-01-25 DIAGNOSIS — R209 Unspecified disturbances of skin sensation: Secondary | ICD-10-CM | POA: Insufficient documentation

## 2013-01-25 DIAGNOSIS — G8110 Spastic hemiplegia affecting unspecified side: Secondary | ICD-10-CM

## 2013-01-25 DIAGNOSIS — I69919 Unspecified symptoms and signs involving cognitive functions following unspecified cerebrovascular disease: Secondary | ICD-10-CM | POA: Insufficient documentation

## 2013-01-25 DIAGNOSIS — S336XXA Sprain of sacroiliac joint, initial encounter: Secondary | ICD-10-CM

## 2013-01-25 MED ORDER — TRAMADOL HCL 50 MG PO TABS: 100.0000 mg | ORAL_TABLET | Freq: Three times a day (TID) | ORAL | Status: AC | PRN

## 2013-01-25 MED ORDER — LISINOPRIL 20 MG PO TABS: 20.0000 mg | ORAL_TABLET | Freq: Two times a day (BID) | ORAL | Status: AC

## 2013-01-25 MED ORDER — BACLOFEN 10 MG PO TABS: 10.0000 mg | ORAL_TABLET | Freq: Three times a day (TID) | ORAL | Status: AC

## 2013-01-25 NOTE — Progress Notes (Signed)
Subjective:    Patient ID: Glenn Maldonado, male    DOB: 1960-12-08, 52 y.o.   MRN: [id number]  HPI Wife says depression has improved after seeing psychiatrist. Continues to complain of weakness in the left leg as well as left arm. Occasional swallowing problems where he coughs up phlegm. No pneumonia Back pain  Left mid back and right low back. This has been going on since after the stroke.  Has fallen once at home about 2 or 3 weeks ago but no significant injury  Pain Inventory Average Pain 9 Pain Right Now 4 My pain is constant and aching  In the last 24 hours, has pain interfered with the following? General activity 8 Relation with others 8 Enjoyment of life 8 What TIME of day is your pain at its worst? constant Sleep (in general) Fair  Pain is worse with: some activites Pain improves with: medication Relief from Meds: 4  Mobility walk without assistance  Function disabled: date disabled 8/13 I need assistance with the following:  meal prep, household duties and shopping  Neuro/Psych bladder control problems bowel control problems weakness numbness tremor tingling trouble walking spasms dizziness  Prior Studies Any changes since last visit?  no  Physicians involved in your care Any changes since last visit?  no   Family History  Problem Relation Age of Onset  . Coronary artery disease Mother   . Peripheral vascular disease Father   . HIV Brother    History   Social History  . Marital Status: Married    Spouse Name: N/A    Number of Children: N/A  . Years of Education: N/A   Social History Main Topics  . Smoking status: Former Games developer  . Smokeless tobacco: Never Used  . Alcohol Use: No     Comment: stopped in 2001  . Drug Use: No  . Sexually Active: None   Other Topics Concern  . None   Social History Narrative  . None   History reviewed. No pertinent past surgical history. Past Medical History  Diagnosis Date  . Hypertension   .  Borderline diabetic   . STEMI (ST elevation myocardial infarction) 06/28/2012  . HTN (hypertension), uncontrolled 06/28/2012  . Leg weakness, bilateral 06/28/2012  . Tobacco abuse 06/28/2012  . Weight loss, non-intentional 06/28/2012  . OSA (obstructive sleep apnea), history of, but with recent weight loss less symptoms 06/28/2012  . Hepatitis C 2001    treated with interferon  . NICM (nonischemic cardiomyopathy), EF 35% at cardiac cath 06/28/2012  . Stroke    BP 134/77  Pulse 67  Resp 14  Ht 5\' 10"  (1.778 m)  Wt 175 lb 9.6 oz (79.652 kg)  BMI 25.2 kg/m2  SpO2 97%   Review of Systems  Constitutional: Positive for unexpected weight change.  Gastrointestinal: Positive for nausea, vomiting and constipation.       Bowel Control Problems  Genitourinary:       Bladder control problems  Musculoskeletal: Positive for gait problem.       Spasms  Neurological: Positive for dizziness, tremors, weakness and numbness.       Tingling  Hematological: Bruises/bleeds easily.  Psychiatric/Behavioral: Positive for confusion. The patient is nervous/anxious.   All other systems reviewed and are negative.       Objective:   Physical Exam  Constitutional: He is oriented to person, place, and time. He appears well-developed and well-nourished.  HENT:  Head: Normocephalic and atraumatic.  Eyes: Conjunctivae normal are normal. Pupils are equal,  round, and reactive to light.  Neck: Normal range of motion.  Musculoskeletal:  Left wrist: He exhibits decreased range of motion and tenderness. He exhibits no bony tenderness.  Pain with wrist extension  Neurological: He is alert and oriented to person, place, and time. He has normal reflexes.  Psychiatric: His speech is slurred. He is slowed and withdrawn. He exhibits a depressed mood.   Tricep reflexes are 2+ on the right side and 3+ on the left side.  Bicep reflexes are 2+ on the right side and 3+ on the left side.  Brachioradialis reflexes are 2+  on the right side and 3+ on the left side.  Patellar reflexes are 2+ on the right side and 3+ on the left side.  Achilles reflexes are 2+ on the right side and 3+ on the left side. 3/5 on Left side Delt bi, tri  3-/5 Left grip  4-/5 Quad. 3-/5 HF 3-/5 PF and DF        Assessment & Plan:  1. Right MCA infarct improving motor strength, exceeded expectations in terms of mobility.Is regained a half grade of strength in the proximal left upper extremity and full grade in the left wrist and finger flexors,But has now plateaued in function. I do feel his cognitive recovery will proceed over the course of one year from the stroke.  2.Spasticity will cont baclofen 4 times a day  3. 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 He has been treated for depression with Wellbutrin in the past and has tolerated this. Agree with continued psychiatry followup  4.  Back pain right side is sacroiliac, left side is thoracic paraspinal muscle. This is do to walking with weak muscle on the left side in compensating for this.

## 2013-01-25 NOTE — Patient Instructions (Addendum)
Back Exercises These exercises may help you when beginning to rehabilitate your injury. Your symptoms may resolve with or without further involvement from your physician, physical therapist or athletic trainer. While completing these exercises, remember:   Restoring tissue flexibility helps normal motion to return to the joints. This allows healthier, less painful movement and activity.  An effective stretch should be held for at least 30 seconds.  A stretch should never be painful. You should only feel a gentle lengthening or release in the stretched tissue. STRETCH  Extension, Prone on Elbows   Lie on your stomach on the floor, a bed will be too soft. Place your palms about shoulder width apart and at the height of your head.  Place your elbows under your shoulders. If this is too painful, stack pillows under your chest.  Allow your body to relax so that your hips drop lower and make contact more completely with the floor.  Hold this position for __________ seconds.  Slowly return to lying flat on the floor. Repeat __________ times. Complete this exercise __________ times per day.  RANGE OF MOTION  Extension, Prone Press Ups   Lie on your stomach on the floor, a bed will be too soft. Place your palms about shoulder width apart and at the height of your head.  Keeping your back as relaxed as possible, slowly straighten your elbows while keeping your hips on the floor. You may adjust the placement of your hands to maximize your comfort. As you gain motion, your hands will come more underneath your shoulders.  Hold this position __________ seconds.  Slowly return to lying flat on the floor. Repeat __________ times. Complete this exercise __________ times per day.  RANGE OF MOTION- Quadruped, Neutral Spine   Assume a hands and knees position on a firm surface. Keep your hands under your shoulders and your knees under your hips. You may place padding under your knees for comfort.  Drop  your head and point your tail bone toward the ground below you. This will round out your low back like an angry cat. Hold this position for __________ seconds.  Slowly lift your head and release your tail bone so that your back sags into a large arch, like an old horse.  Hold this position for __________ seconds.  Repeat this until you feel limber in your low back.  Now, find your "sweet spot." This will be the most comfortable position somewhere between the two previous positions. This is your neutral spine. Once you have found this position, tense your stomach muscles to support your low back.  Hold this position for __________ seconds. Repeat __________ times. Complete this exercise __________ times per day.  STRETCH  Flexion, Single Knee to Chest   Lie on a firm bed or floor with both legs extended in front of you.  Keeping one leg in contact with the floor, bring your opposite knee to your chest. Hold your leg in place by either grabbing behind your thigh or at your knee.  Pull until you feel a gentle stretch in your low back. Hold __________ seconds.  Slowly release your grasp and repeat the exercise with the opposite side. Repeat __________ times. Complete this exercise __________ times per day.  STRETCH - Hamstrings, Standing  Stand or sit and extend your right / left leg, placing your foot on a chair or foot stool  Keeping a slight arch in your low back and your hips straight forward.  Lead with your chest and   lean forward at the waist until you feel a gentle stretch in the back of your right / left knee or thigh. (When done correctly, this exercise requires leaning only a small distance.)  Hold this position for __________ seconds. Repeat __________ times. Complete this stretch __________ times per day. STRENGTHENING  Deep Abdominals, Pelvic Tilt   Lie on a firm bed or floor. Keeping your legs in front of you, bend your knees so they are both pointed toward the ceiling and  your feet are flat on the floor.  Tense your lower abdominal muscles to press your low back into the floor. This motion will rotate your pelvis so that your tail bone is scooping upwards rather than pointing at your feet or into the floor.  With a gentle tension and even breathing, hold this position for __________ seconds. Repeat __________ times. Complete this exercise __________ times per day.  STRENGTHENING  Abdominals, Crunches   Lie on a firm bed or floor. Keeping your legs in front of you, bend your knees so they are both pointed toward the ceiling and your feet are flat on the floor. Cross your arms over your chest.  Slightly tip your chin down without bending your neck.  Tense your abdominals and slowly lift your trunk high enough to just clear your shoulder blades. Lifting higher can put excessive stress on the low back and does not further strengthen your abdominal muscles.  Control your return to the starting position. Repeat __________ times. Complete this exercise __________ times per day.  STRENGTHENING  Quadruped, Opposite UE/LE Lift   Assume a hands and knees position on a firm surface. Keep your hands under your shoulders and your knees under your hips. You may place padding under your knees for comfort.  Find your neutral spine and gently tense your abdominal muscles so that you can maintain this position. Your shoulders and hips should form a rectangle that is parallel with the floor and is not twisted.  Keeping your trunk steady, lift your right hand no higher than your shoulder and then your left leg no higher than your hip. Make sure you are not holding your breath. Hold this position __________ seconds.  Continuing to keep your abdominal muscles tense and your back steady, slowly return to your starting position. Repeat with the opposite arm and leg. Repeat __________ times. Complete this exercise __________ times per day. Document Released: 11/08/2005 Document  Revised: 01/13/2012 Document Reviewed: 02/02/2009 [facility] Patient Information 2013 [place], [place].   I reorder lisinopril, tramadol, baclofen [facility] Get a left ankle brace from the [employer]

## 2013-01-26 ENCOUNTER — Other Ambulatory Visit: Payer: Self-pay | Admitting: Neurology

## 2013-02-23 ENCOUNTER — Encounter: Payer: Self-pay | Admitting: Physical Medicine and Rehabilitation

## 2013-02-23 ENCOUNTER — Encounter
Payer: Medicaid - Out of State | Attending: Physical Medicine & Rehabilitation | Admitting: Physical Medicine and Rehabilitation

## 2013-02-23 VITALS — BP 144/83 | HR 75 | Resp 16 | Ht 70.0 in | Wt 174.0 lb

## 2013-02-23 DIAGNOSIS — I69959 Hemiplegia and hemiparesis following unspecified cerebrovascular disease affecting unspecified side: Secondary | ICD-10-CM | POA: Insufficient documentation

## 2013-02-23 DIAGNOSIS — M549 Dorsalgia, unspecified: Secondary | ICD-10-CM | POA: Insufficient documentation

## 2013-02-23 DIAGNOSIS — R269 Unspecified abnormalities of gait and mobility: Secondary | ICD-10-CM | POA: Insufficient documentation

## 2013-02-23 DIAGNOSIS — I69919 Unspecified symptoms and signs involving cognitive functions following unspecified cerebrovascular disease: Secondary | ICD-10-CM | POA: Insufficient documentation

## 2013-02-23 DIAGNOSIS — I69354 Hemiplegia and hemiparesis following cerebral infarction affecting left non-dominant side: Secondary | ICD-10-CM

## 2013-02-23 DIAGNOSIS — I6350 Cerebral infarction due to unspecified occlusion or stenosis of unspecified cerebral artery: Secondary | ICD-10-CM

## 2013-02-23 DIAGNOSIS — R5381 Other malaise: Secondary | ICD-10-CM | POA: Insufficient documentation

## 2013-02-23 DIAGNOSIS — R209 Unspecified disturbances of skin sensation: Secondary | ICD-10-CM | POA: Insufficient documentation

## 2013-02-23 DIAGNOSIS — I639 Cerebral infarction, unspecified: Secondary | ICD-10-CM

## 2013-02-23 DIAGNOSIS — T753XXA Motion sickness, initial encounter: Secondary | ICD-10-CM | POA: Insufficient documentation

## 2013-02-23 NOTE — Progress Notes (Signed)
Subjective:    Patient ID: Glenn Maldonado, male    DOB: [birth year], 52 y.o.   MRN: [id number]  HPI The patient is a 52 year old male, who presents with left sided hemiparesis . 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 The patient complains about mild to moderate pain in his left shoulder blade muscles and his lower back on the right. Patient also complains about weakness on the left side of his body.    His Wife reports that his depression has improved after seeing a psychiatrist. She also reports that the patient is doing exercises he learned from PT regularly.   Pain Inventory Average Pain 5 Pain Right Now 3 My pain is varies  In the last 24 hours, has pain interfered with the following? General activity 5 Relation with others 3 Enjoyment of life 5 What TIME of day is your pain at its worst? daytime Sleep (in general) Fair  Pain is worse with: bending and some activites Pain improves with: rest, heat/ice and medication Relief from Meds: 2  Mobility use a cane how many minutes can you walk? 10 ability to climb steps?  yes do you drive?  no  Function disabled: date disabled 66 I need assistance with the following:  bathing, meal prep and shopping  Neuro/Psych bowel control problems tremor trouble walking spasms  Prior Studies Any changes since last visit?  no  Physicians involved in your care Any changes since last visit?  no   Family History  Problem Relation Age of Onset  . Coronary artery disease Mother   . Peripheral vascular disease Father   . HIV Brother    History   Social History  . Marital Status: Married    Spouse Name: N/A    Number of Children: N/A  . Years of Education: N/A   Social History Main Topics  . Smoking status: Current Some Day Smoker  . Smokeless tobacco: Never Used     Comment: uses e cigarettes  . Alcohol Use: No     Comment: stopped in 2001  . Drug Use: No  . Sexually Active: None   Other  Topics Concern  . None   Social History Narrative  . None   History reviewed. No pertinent past surgical history. Past Medical History  Diagnosis Date  . Hypertension   . Borderline diabetic   . STEMI (ST elevation myocardial infarction) 06/28/2012  . HTN (hypertension), uncontrolled 06/28/2012  . Leg weakness, bilateral 06/28/2012  . Tobacco abuse 06/28/2012  . Weight loss, non-intentional 06/28/2012  . OSA (obstructive sleep apnea), history of, but with recent weight loss less symptoms 06/28/2012  . Hepatitis C 2001    treated with interferon  . NICM (nonischemic cardiomyopathy), EF 35% at cardiac cath 06/28/2012  . Stroke    BP 144/83  Pulse 75  Resp 16  Ht 5\' 10"  (1.778 m)  Wt 174 lb (78.926 kg)  BMI 24.97 kg/m2  SpO2 95%     Review of Systems  Constitutional: Positive for appetite change.  Musculoskeletal: Positive for gait problem.  Neurological: Positive for tremors.  All other systems reviewed and are negative.       Objective:   Physical Exam  Constitutional: He is oriented to person, place, and time. He appears well-developed and well-nourished.  Walking with a cane, and with slight spasticity in his right LE  HENT:  Head: Normocephalic.  Neck: Neck supple.  Musculoskeletal: He exhibits tenderness.  Neurological: He is  alert and oriented to person, place, and time.  Skin: Skin is warm and dry.  Psychiatric: He has a normal mood and affect.    Symmetric normal motor tone is noted throughout, slightly increased muscle tone in flexors of LUE. Normal muscle bulk, atrophy of left triceps. Muscle testing reveals 5/5 muscle strength of the upper extremity, and 5/5 of the lower extremity, on the right ; LUE : deltoid 4/5, biceps 4/5, triceps 4-/5, finger and wrist flexors 4/5, finger and wrist extensors 1-2/5 LLE : 4/5, except peroneus 3+/5  . Full range of motion in upper and lower extremities, except mild restriction 5 degrees in left shoulder flection.. ROM of  spine is restricted. Fine motor movements are normal in the right , almost not possible in the left hand.  Sensory is intact and symmetric to light touch, pinprick and proprioception. DTR in the upper and lower extremity are present and symmetric 2+ on the right ; 3+ on the left. No clonus is noted.  Patient arises from chair with mild difficulty. Wide based gait with a cane, and slight spasticity of left LE .       Assessment & Plan:  1. Right MCA infarct, in August 2013.He has been working well on improving motor strength, he exceeded expectations in terms of mobility.Advised patient to continue with his exercise program, and also demonstrated / practiced some new exercises with him, which he should also do at home.  2.Spasticity will cont baclofen 4 times a day  3. 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 He has been treated for depression with Wellbutrin in the past and has tolerated this. Agree with continued psychiatry followup   4. Back pain right side is sacroiliac, left side is thoracic paraspinal/ shoulder blade muscle. Advised patient to do some stretches, also his wife could massage those pain ful areas with a tennis ball, or a massage ball. 5. Patient is on Aspirin 325mg , he states, that he is bleeding easily, and is asking whether he can lower his dose. I advised him to not lower the dose by himself, he is on a high risk of a recurrent stroke, I advised him to make an appointment with his neurologist Dr. Pearlean Brownie to discuss his bloodthinner medication. Follow up in 3 month

## 2013-02-23 NOTE — Patient Instructions (Signed)
Continue with your exercise program, also do the exercises I told you .

## 2013-02-26 ENCOUNTER — Telehealth (HOSPITAL_COMMUNITY): Payer: Self-pay | Admitting: *Deleted

## 2013-03-24 ENCOUNTER — Other Ambulatory Visit: Payer: Self-pay | Admitting: Neurology

## 2013-05-25 ENCOUNTER — Encounter
Payer: Medicaid - Out of State | Attending: Physical Medicine and Rehabilitation | Admitting: Physical Medicine and Rehabilitation

## 2013-09-14 IMAGING — CT CT HEAD W/O CM
1 series · 16 of 30 positions shown, 20 images · non-contrast
Comparison: None.

CLINICAL DATA: Dizziness.  Rule out stroke...  Code STEMI.

CT HEAD WITHOUT CONTRAST
TECHNIQUE: Contiguous axial images were obtained from the base of
the skull through the vertex without contrast.

[Series 2: head routine 4.8 h37s · axial · 0.44mm/px · z∈[-155,+3]mm · 16 of 36 slices shown, 20 images]
[im 2/36  brain]
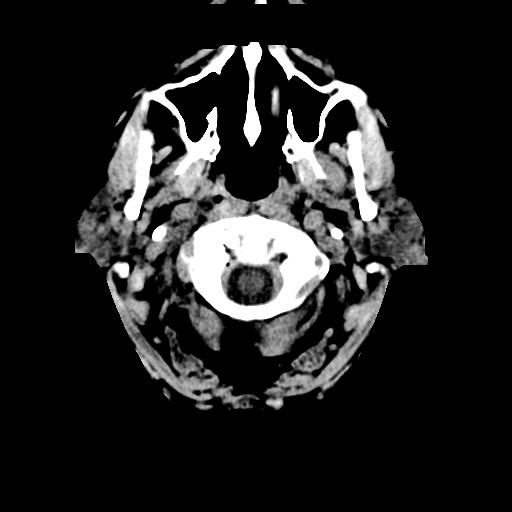
[im 2/36  bone]
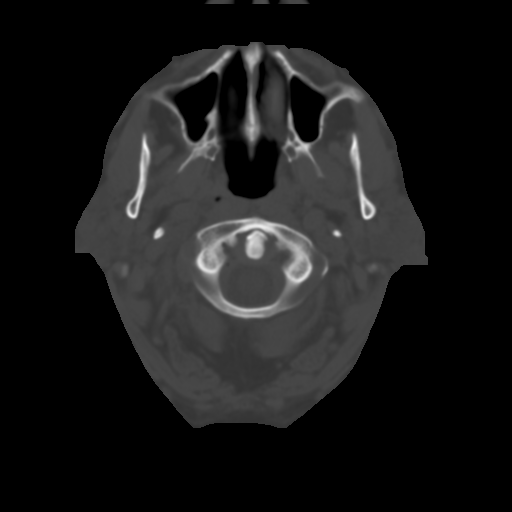
[im 4/36  brain]
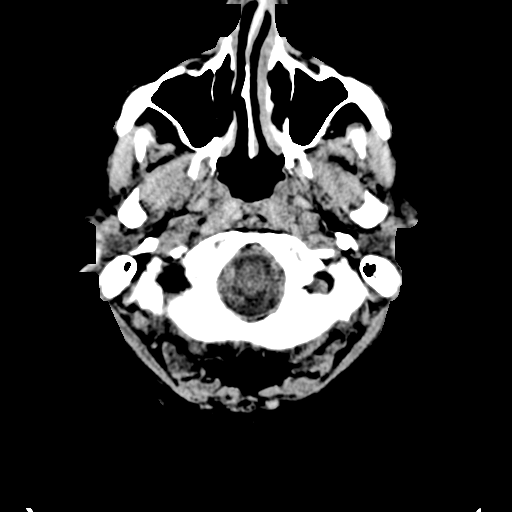
[im 7/36  brain]
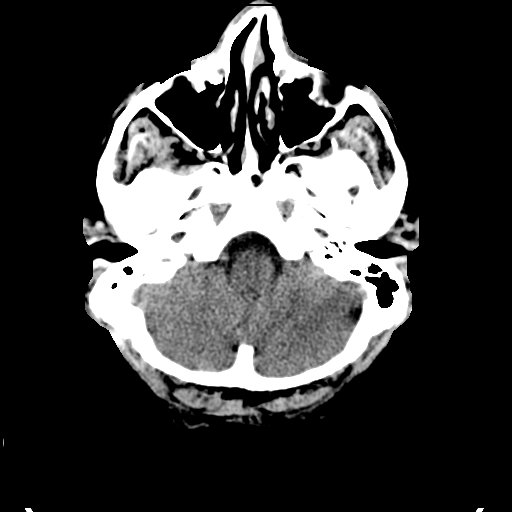
[im 9/36  brain]
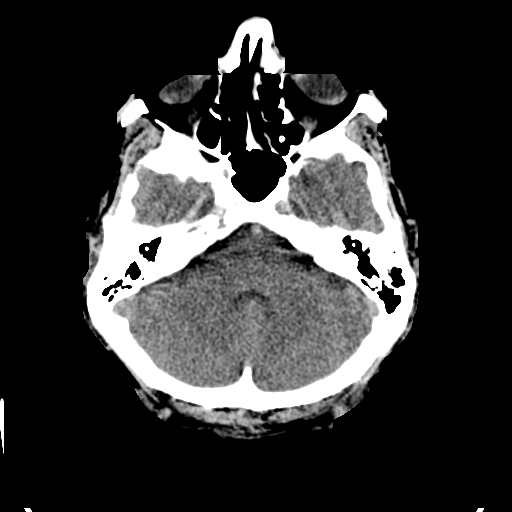
[im 10/36  brain]
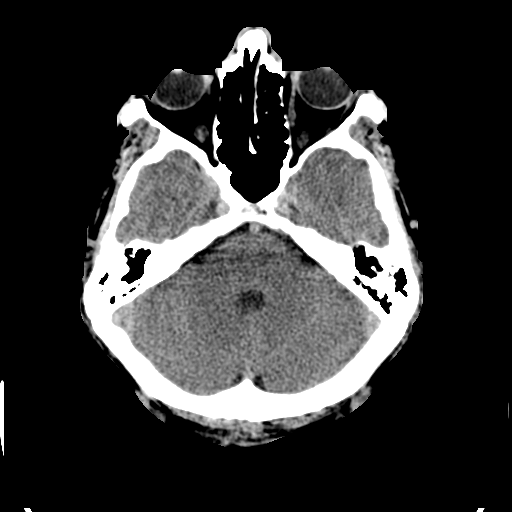
[im 10/36  bone]
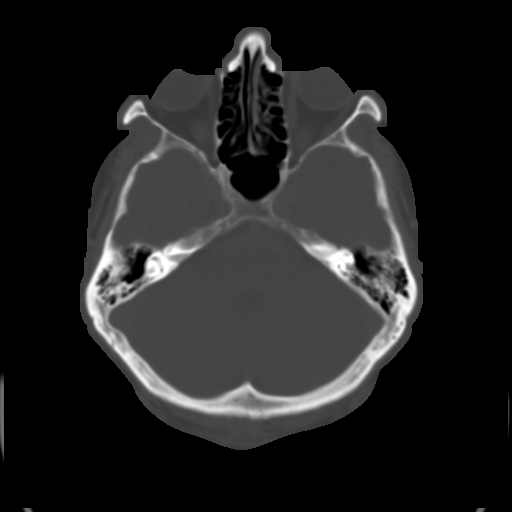
[im 13/36  brain]
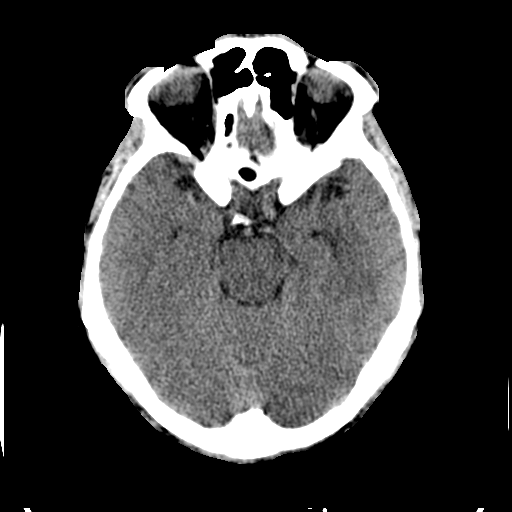
[im 15/36  brain]
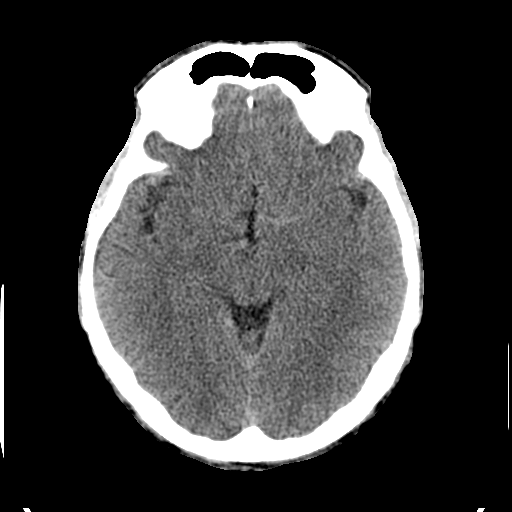
[im 17/36  brain]
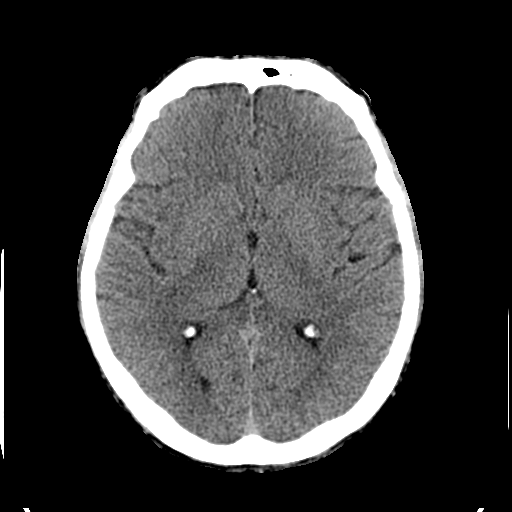
[im 19/36  brain]
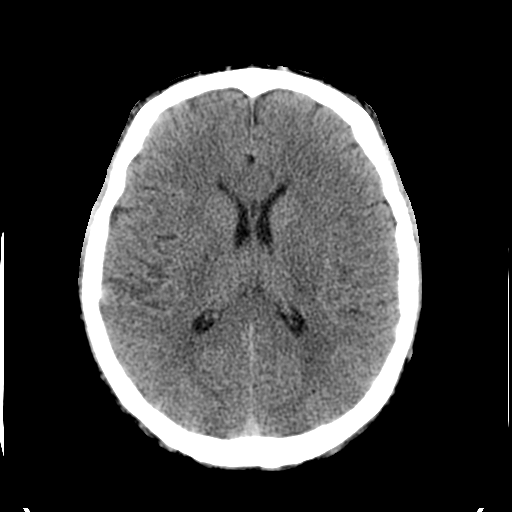
[im 19/36  bone]
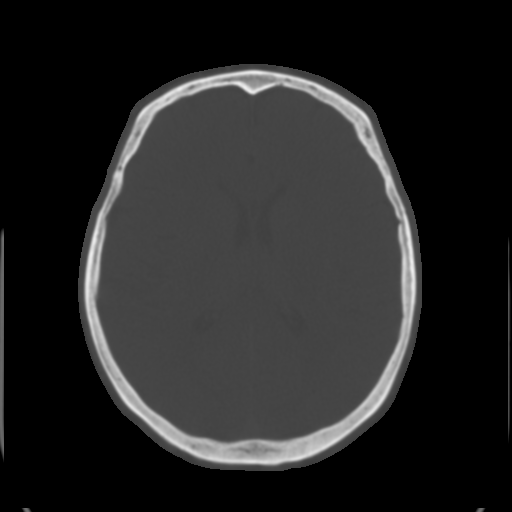
[im 21/36  brain]
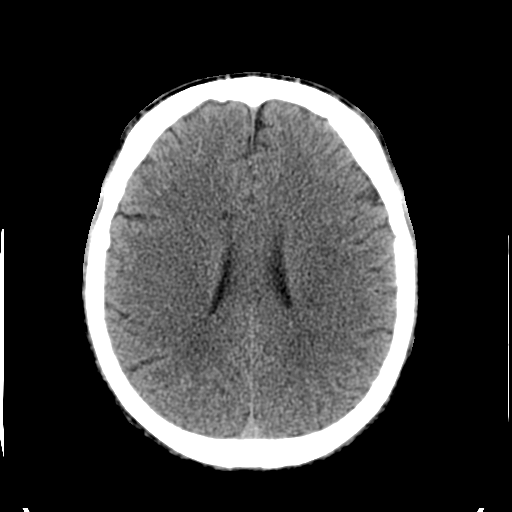
[im 23/36  brain]
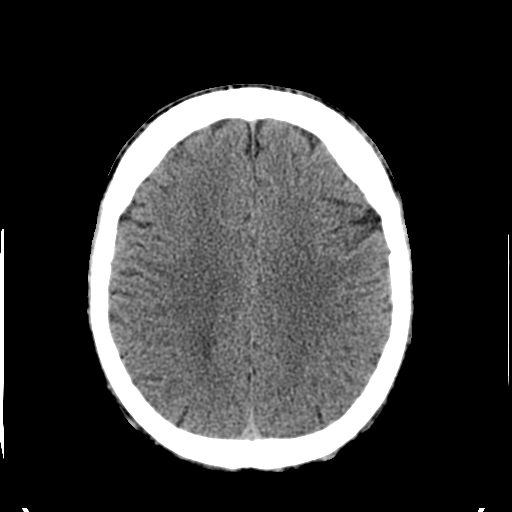
[im 26/36  brain]
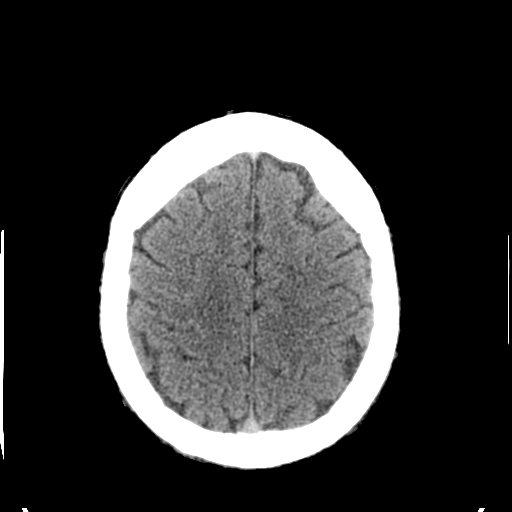
[im 27/36  brain]
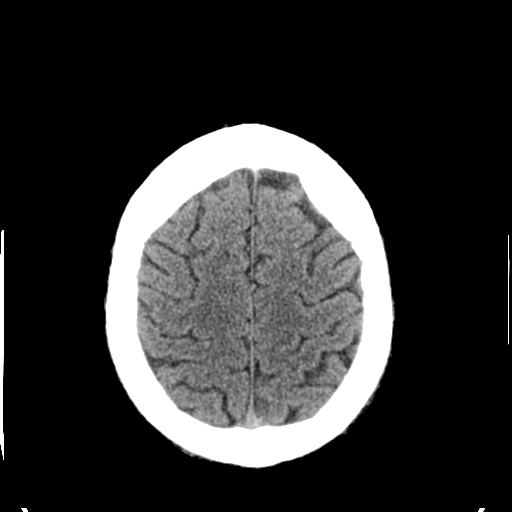
[im 27/36  bone]
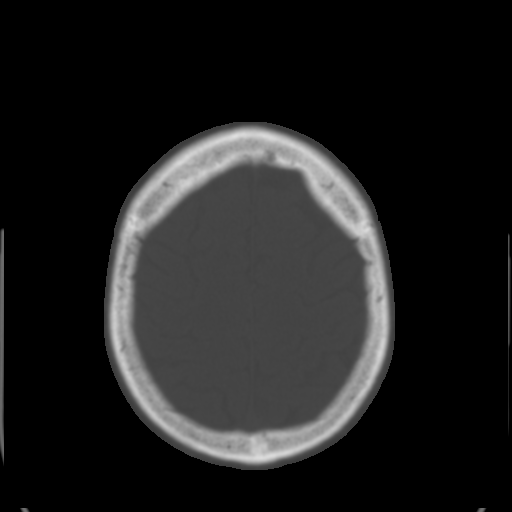
[im 29/36  brain]
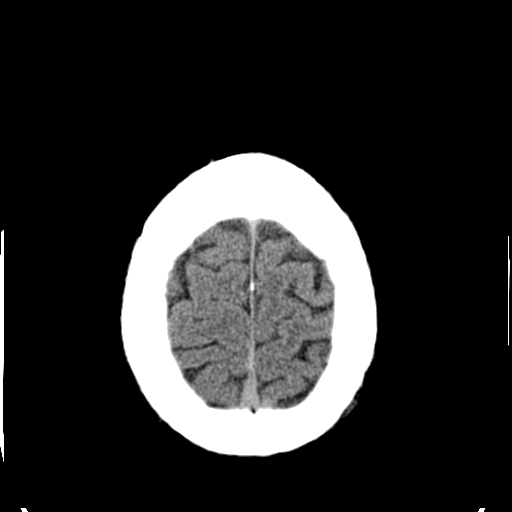
[im 32/36  brain]
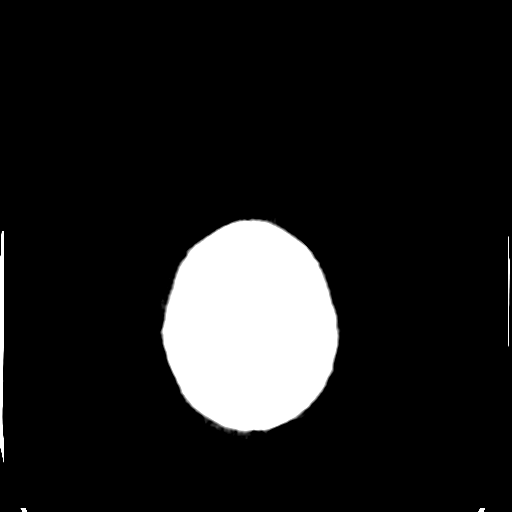
[im 34/36  brain]
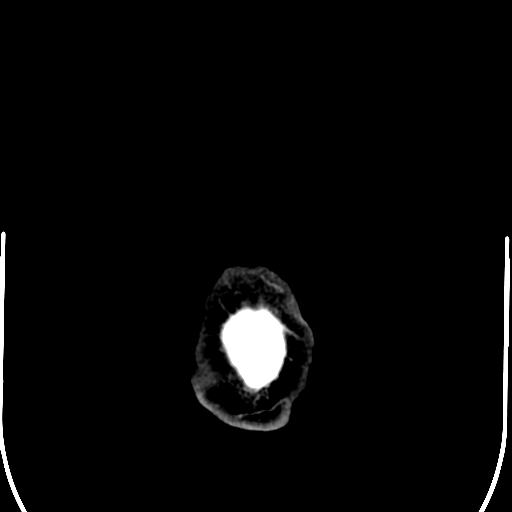

[16 of 30 positions shown; findings below may reference images not displayed]

FINDINGS: Normal appearance of the intracranial structures.  No
evidence for acute hemorrhage, mass lesion, midline shift,
hydrocephalus or large infarct.  There are subtle areas of low
density in the subcortical and periventricular white matter which
could represent chronic changes.  The paranasal sinuses are clear.
No acute bony abnormality.
IMPRESSION: No evidence for acute hemorrhage.  No evidence for a large infarct.

Subtle areas of low density in the white matter may represent
chronic small vessel ischemic changes.

## 2013-09-15 IMAGING — CT CT HEAD W/O CM
1 of 2 series · 13 of 30 positions shown, 17 images · non-contrast
Comparison: 06/28/2012

CLINICAL DATA: Left-sided weakness, facial droop.

CT HEAD WITHOUT CONTRAST
TECHNIQUE: Contiguous axial images were obtained from the base of
the skull through the vertex without contrast.

[Series 2: brain · axial · 0.49mm/px · z∈[+158,+293]mm · 13 of 32 slices shown, 17 images]
[im 3/32  brain]
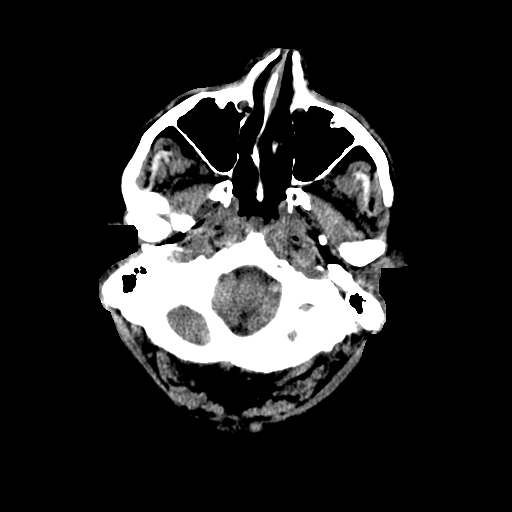
[im 3/32  bone]
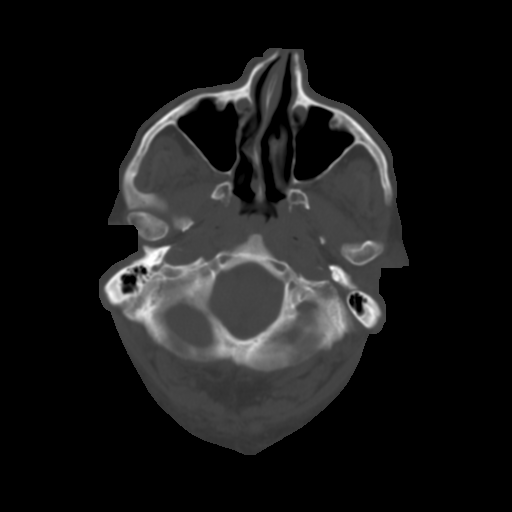
[im 5/32  brain]
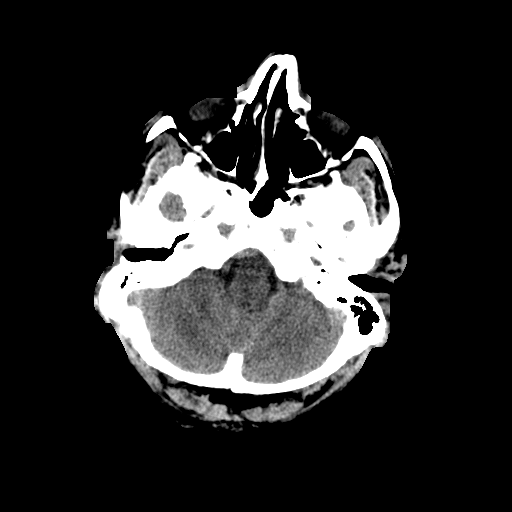
[im 7/32  brain]
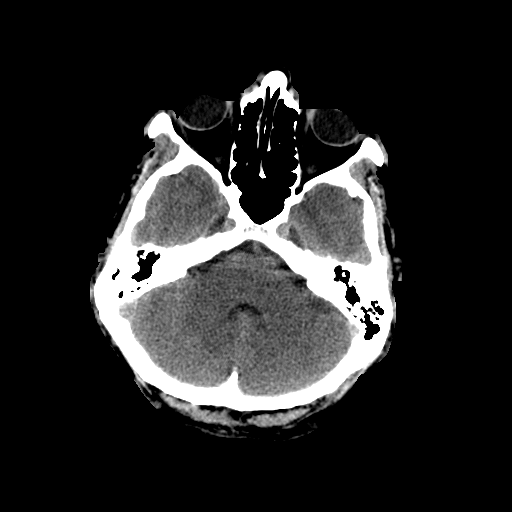
[im 9/32  brain]
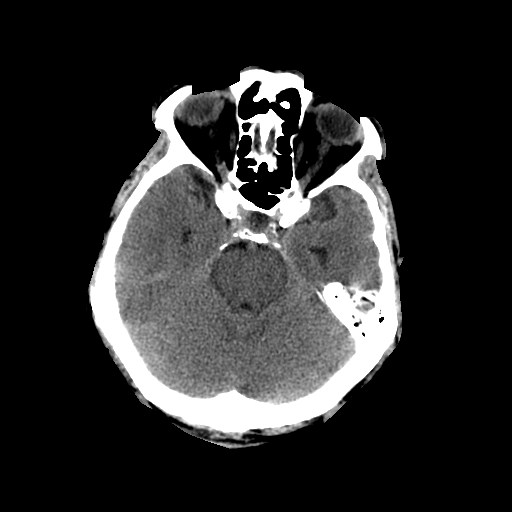
[im 12/32  brain]
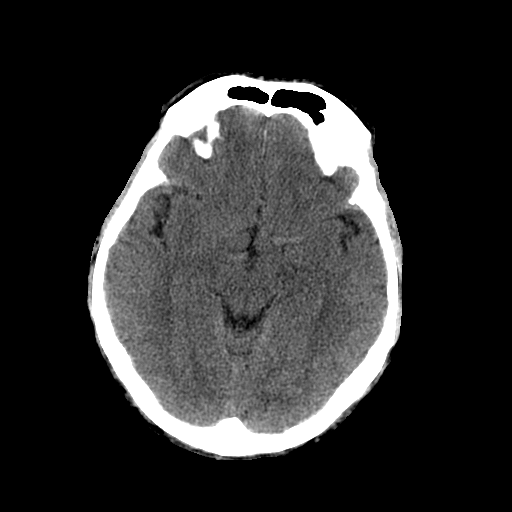
[im 12/32  bone]
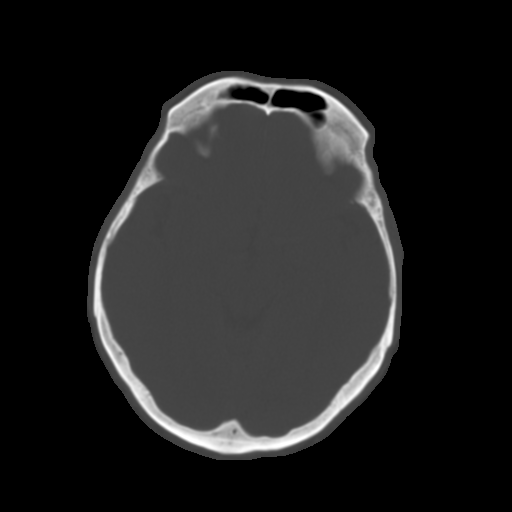
[im 14/32  brain]
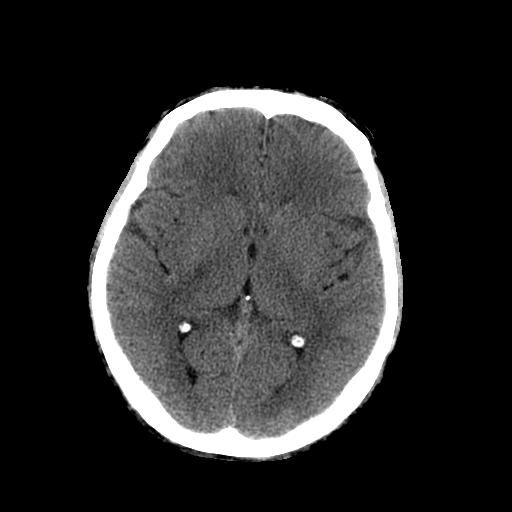
[im 16/32  brain]
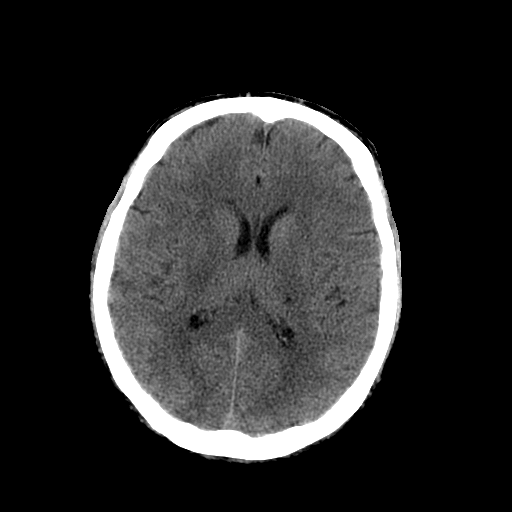
[im 18/32  brain]
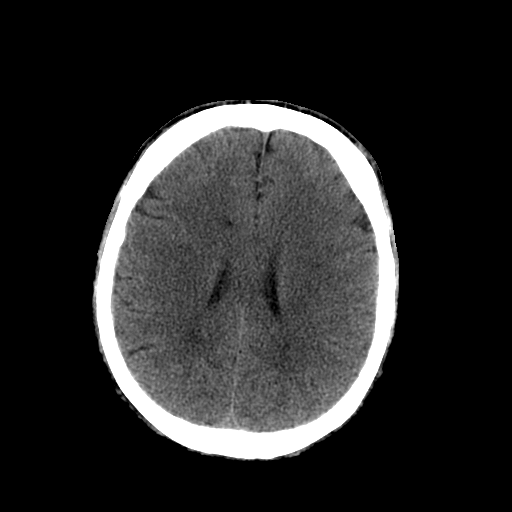
[im 20/32  brain]
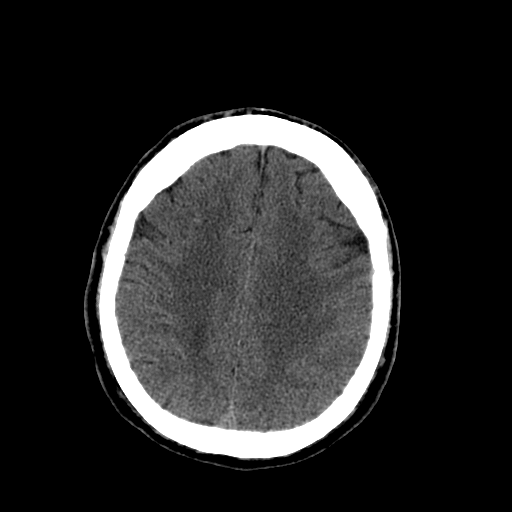
[im 20/32  bone]
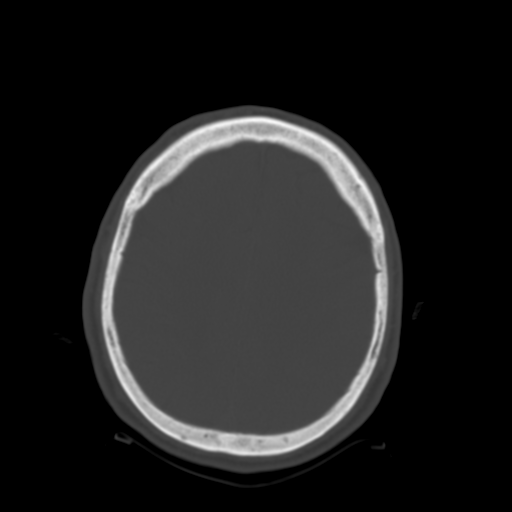
[im 23/32  brain]
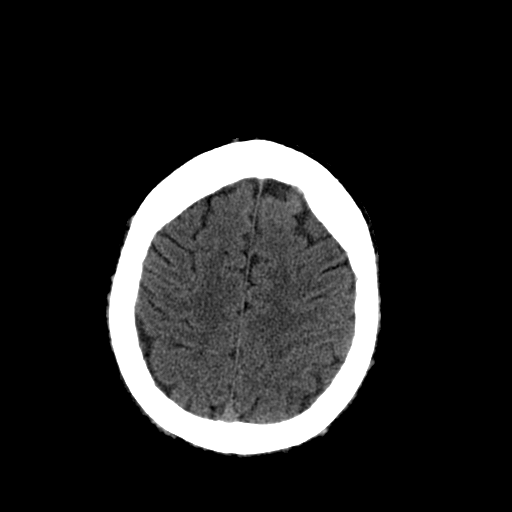
[im 25/32  brain]
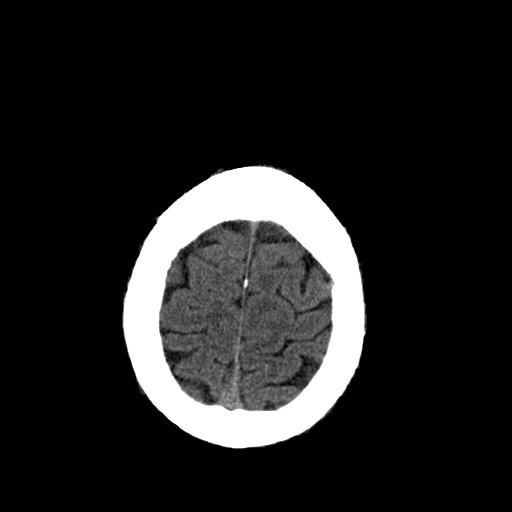
[im 27/32  brain]
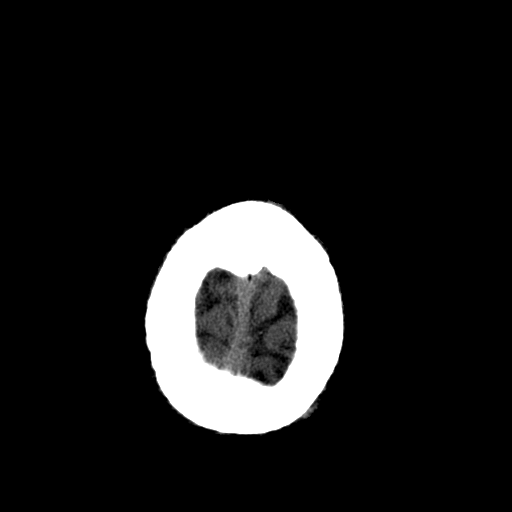
[im 29/32  brain]
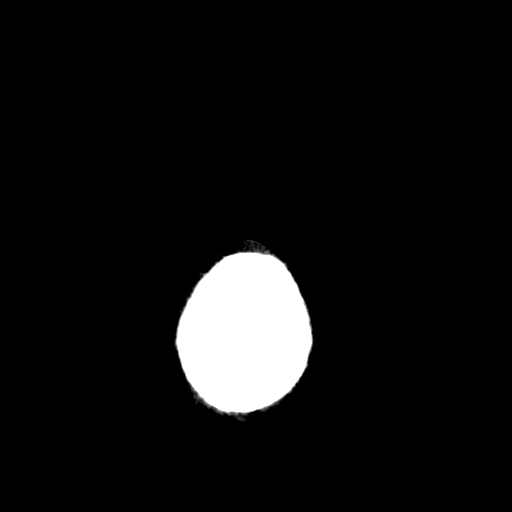
[im 29/32  bone]
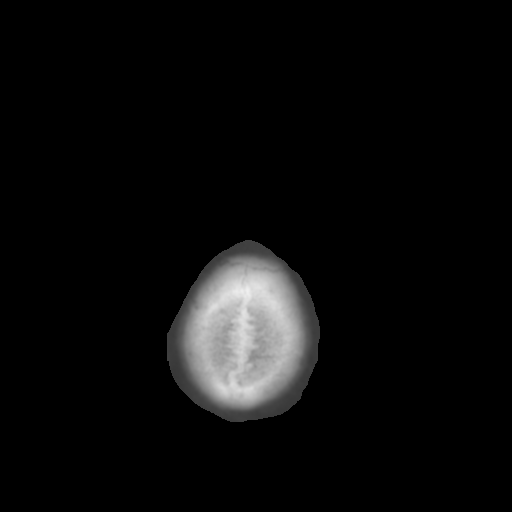

[13 of 30 positions shown; findings below may reference images not displayed]

FINDINGS: Periventricular and subcortical white matter
hypodensities are most in keeping with chronic microangiopathic
change. There is no evidence for acute hemorrhage, hydrocephalus,
mass lesion, or abnormal extra-axial fluid collection.  No definite
CT evidence for acute cortical based (large artery) infarction.
The visualized paranasal sinuses and mastoid air cells are
predominately clear.
IMPRESSION: White matter changes as above.  No definite evidence of acute
intracranial abnormality. MRI has increased sensitivity for acute
ischemia if clinical concern persists.

## 2013-09-24 ENCOUNTER — Other Ambulatory Visit: Payer: Self-pay | Admitting: Neurology

## 2014-10-13 ENCOUNTER — Encounter (HOSPITAL_COMMUNITY): Payer: Self-pay | Admitting: Cardiology

## 2015-05-23 ENCOUNTER — Encounter: Payer: Self-pay | Admitting: *Deleted

## 2015-06-13 ENCOUNTER — Encounter: Payer: Self-pay | Admitting: Cardiology

## 2019-10-27 NOTE — Telephone Encounter (Signed)
completed
